# Patient Record
Sex: Female | Born: 1975 | Race: Black or African American | Hispanic: No | Marital: Single | Smoking: Former smoker
Health system: Southern US, Community
[De-identification: ages and names within clinical notes are randomized; demographics above are authoritative.]

## PROBLEM LIST (undated history)

## (undated) DIAGNOSIS — Z9889 Other specified postprocedural states: Secondary | ICD-10-CM

## (undated) DIAGNOSIS — IMO0002 Reserved for concepts with insufficient information to code with codable children: Secondary | ICD-10-CM

## (undated) DIAGNOSIS — G8929 Other chronic pain: Secondary | ICD-10-CM

## (undated) DIAGNOSIS — A630 Anogenital (venereal) warts: Secondary | ICD-10-CM

## (undated) DIAGNOSIS — F32A Depression, unspecified: Secondary | ICD-10-CM

## (undated) DIAGNOSIS — R112 Nausea with vomiting, unspecified: Secondary | ICD-10-CM

## (undated) DIAGNOSIS — F329 Major depressive disorder, single episode, unspecified: Secondary | ICD-10-CM

## (undated) DIAGNOSIS — R896 Abnormal cytological findings in specimens from other organs, systems and tissues: Secondary | ICD-10-CM

## (undated) DIAGNOSIS — IMO0001 Reserved for inherently not codable concepts without codable children: Secondary | ICD-10-CM

## (undated) DIAGNOSIS — E119 Type 2 diabetes mellitus without complications: Secondary | ICD-10-CM

## (undated) DIAGNOSIS — I10 Essential (primary) hypertension: Secondary | ICD-10-CM

## (undated) HISTORY — PX: TONSILLECTOMY AND ADENOIDECTOMY: SUR1326

## (undated) HISTORY — DX: Depression, unspecified: F32.A

## (undated) HISTORY — DX: Major depressive disorder, single episode, unspecified: F32.9

## (undated) HISTORY — PX: CERVICAL BIOPSY  W/ LOOP ELECTRODE EXCISION: SUR135

## (undated) HISTORY — DX: Reserved for concepts with insufficient information to code with codable children: IMO0002

## (undated) HISTORY — DX: Other chronic pain: G89.29

## (undated) HISTORY — DX: Reserved for inherently not codable concepts without codable children: IMO0001

## (undated) HISTORY — PX: APPENDECTOMY: SHX54

## (undated) HISTORY — DX: Essential (primary) hypertension: I10

## (undated) HISTORY — DX: Abnormal cytological findings in specimens from other organs, systems and tissues: R89.6

## (undated) HISTORY — DX: Anogenital (venereal) warts: A63.0

## (undated) HISTORY — PX: ABDOMINAL SURGERY: SHX537

## (undated) HISTORY — DX: Type 2 diabetes mellitus without complications: E11.9

---

## 1999-04-03 ENCOUNTER — Other Ambulatory Visit: Admission: RE | Admit: 1999-04-03 | Discharge: 1999-04-03 | Payer: Self-pay | Admitting: Gynecology

## 1999-04-04 ENCOUNTER — Encounter (INDEPENDENT_AMBULATORY_CARE_PROVIDER_SITE_OTHER): Payer: Self-pay | Admitting: Specialist

## 1999-04-04 ENCOUNTER — Other Ambulatory Visit: Admission: RE | Admit: 1999-04-04 | Discharge: 1999-04-04 | Payer: Self-pay | Admitting: Gynecology

## 1999-07-18 ENCOUNTER — Emergency Department (HOSPITAL_COMMUNITY): Admission: EM | Admit: 1999-07-18 | Discharge: 1999-07-18 | Payer: Self-pay | Admitting: Emergency Medicine

## 1999-09-26 ENCOUNTER — Emergency Department (HOSPITAL_COMMUNITY): Admission: EM | Admit: 1999-09-26 | Discharge: 1999-09-26 | Payer: Self-pay | Admitting: *Deleted

## 1999-11-02 ENCOUNTER — Ambulatory Visit (HOSPITAL_COMMUNITY): Admission: RE | Admit: 1999-11-02 | Discharge: 1999-11-02 | Payer: Self-pay | Admitting: Gynecology

## 2005-01-04 ENCOUNTER — Ambulatory Visit (HOSPITAL_COMMUNITY): Admission: RE | Admit: 2005-01-04 | Discharge: 2005-01-04 | Payer: Self-pay | Admitting: Chiropractic Medicine

## 2005-12-12 ENCOUNTER — Emergency Department (HOSPITAL_COMMUNITY): Admission: EM | Admit: 2005-12-12 | Discharge: 2005-12-13 | Payer: Self-pay | Admitting: Emergency Medicine

## 2006-01-11 ENCOUNTER — Emergency Department (HOSPITAL_COMMUNITY): Admission: EM | Admit: 2006-01-11 | Discharge: 2006-01-12 | Payer: Self-pay | Admitting: Emergency Medicine

## 2006-12-30 ENCOUNTER — Other Ambulatory Visit: Admission: RE | Admit: 2006-12-30 | Discharge: 2006-12-30 | Payer: Self-pay | Admitting: Gynecology

## 2007-07-13 ENCOUNTER — Emergency Department (HOSPITAL_COMMUNITY): Admission: EM | Admit: 2007-07-13 | Discharge: 2007-07-13 | Payer: Self-pay | Admitting: Emergency Medicine

## 2007-08-06 ENCOUNTER — Other Ambulatory Visit: Admission: RE | Admit: 2007-08-06 | Discharge: 2007-08-06 | Payer: Self-pay | Admitting: Gynecology

## 2007-09-09 ENCOUNTER — Emergency Department (HOSPITAL_COMMUNITY): Admission: EM | Admit: 2007-09-09 | Discharge: 2007-09-10 | Payer: Self-pay | Admitting: Emergency Medicine

## 2008-02-15 ENCOUNTER — Other Ambulatory Visit: Admission: RE | Admit: 2008-02-15 | Discharge: 2008-02-15 | Payer: Self-pay | Admitting: Gynecology

## 2008-09-14 ENCOUNTER — Ambulatory Visit: Payer: Self-pay | Admitting: Gynecology

## 2008-09-14 ENCOUNTER — Encounter: Payer: Self-pay | Admitting: Gynecology

## 2008-09-14 ENCOUNTER — Other Ambulatory Visit: Admission: RE | Admit: 2008-09-14 | Discharge: 2008-09-14 | Payer: Self-pay | Admitting: Gynecology

## 2008-11-23 ENCOUNTER — Ambulatory Visit: Payer: Self-pay | Admitting: Women's Health

## 2009-02-23 ENCOUNTER — Ambulatory Visit: Payer: Self-pay | Admitting: Gynecology

## 2009-02-23 ENCOUNTER — Encounter: Payer: Self-pay | Admitting: Gynecology

## 2009-02-23 ENCOUNTER — Other Ambulatory Visit: Admission: RE | Admit: 2009-02-23 | Discharge: 2009-02-23 | Payer: Self-pay | Admitting: Gynecology

## 2009-02-23 DIAGNOSIS — IMO0002 Reserved for concepts with insufficient information to code with codable children: Secondary | ICD-10-CM

## 2009-02-23 HISTORY — DX: Reserved for concepts with insufficient information to code with codable children: IMO0002

## 2009-03-08 ENCOUNTER — Ambulatory Visit: Payer: Self-pay | Admitting: Gynecology

## 2009-03-14 ENCOUNTER — Ambulatory Visit: Payer: Self-pay | Admitting: Gynecology

## 2009-04-12 ENCOUNTER — Encounter: Payer: Self-pay | Admitting: Gynecology

## 2009-04-12 ENCOUNTER — Ambulatory Visit: Payer: Self-pay | Admitting: Gynecology

## 2009-09-14 ENCOUNTER — Other Ambulatory Visit: Admission: RE | Admit: 2009-09-14 | Discharge: 2009-09-14 | Payer: Self-pay | Admitting: Gynecology

## 2009-09-14 ENCOUNTER — Ambulatory Visit: Payer: Self-pay | Admitting: Gynecology

## 2010-01-05 ENCOUNTER — Ambulatory Visit: Payer: Self-pay | Admitting: Gynecology

## 2010-03-01 ENCOUNTER — Ambulatory Visit: Payer: Self-pay | Admitting: Gynecology

## 2010-03-01 ENCOUNTER — Other Ambulatory Visit: Admission: RE | Admit: 2010-03-01 | Discharge: 2010-03-01 | Payer: Self-pay | Admitting: Gynecology

## 2010-06-15 ENCOUNTER — Ambulatory Visit: Payer: Self-pay | Admitting: Gynecology

## 2010-07-06 ENCOUNTER — Ambulatory Visit: Payer: Self-pay | Admitting: Women's Health

## 2010-07-24 ENCOUNTER — Ambulatory Visit: Payer: Self-pay | Admitting: Gynecology

## 2010-09-03 ENCOUNTER — Ambulatory Visit: Admit: 2010-09-03 | Payer: Self-pay | Admitting: Gynecology

## 2010-09-18 ENCOUNTER — Other Ambulatory Visit
Admission: RE | Admit: 2010-09-18 | Discharge: 2010-09-18 | Payer: Self-pay | Source: Home / Self Care | Admitting: Gynecology

## 2010-09-18 ENCOUNTER — Ambulatory Visit
Admission: RE | Admit: 2010-09-18 | Discharge: 2010-09-18 | Payer: Self-pay | Source: Home / Self Care | Attending: Gynecology | Admitting: Gynecology

## 2010-09-18 ENCOUNTER — Other Ambulatory Visit: Payer: Self-pay | Admitting: Gynecology

## 2011-03-09 ENCOUNTER — Ambulatory Visit
Admission: RE | Admit: 2011-03-09 | Discharge: 2011-03-09 | Disposition: A | Discharge status: Home / Self Care | Payer: PRIVATE HEALTH INSURANCE | Source: Ambulatory Visit | Attending: Family Medicine | Admitting: Family Medicine

## 2011-03-09 ENCOUNTER — Other Ambulatory Visit: Payer: Self-pay | Admitting: Family Medicine

## 2011-03-09 ENCOUNTER — Inpatient Hospital Stay (INDEPENDENT_AMBULATORY_CARE_PROVIDER_SITE_OTHER)
Admission: RE | Admit: 2011-03-09 | Discharge: 2011-03-09 | Disposition: A | Discharge status: Home / Self Care | Payer: PRIVATE HEALTH INSURANCE | Source: Ambulatory Visit | Attending: Family Medicine | Admitting: Family Medicine

## 2011-03-09 ENCOUNTER — Encounter: Payer: Self-pay | Admitting: Family Medicine

## 2011-03-09 DIAGNOSIS — M79609 Pain in unspecified limb: Secondary | ICD-10-CM

## 2011-03-09 DIAGNOSIS — M65979 Unspecified synovitis and tenosynovitis, unspecified ankle and foot: Secondary | ICD-10-CM

## 2011-03-09 DIAGNOSIS — M659 Synovitis and tenosynovitis, unspecified: Secondary | ICD-10-CM

## 2011-03-09 DIAGNOSIS — J45909 Unspecified asthma, uncomplicated: Secondary | ICD-10-CM | POA: Insufficient documentation

## 2011-03-18 ENCOUNTER — Ambulatory Visit (INDEPENDENT_AMBULATORY_CARE_PROVIDER_SITE_OTHER): Payer: PRIVATE HEALTH INSURANCE

## 2011-03-18 ENCOUNTER — Inpatient Hospital Stay (INDEPENDENT_AMBULATORY_CARE_PROVIDER_SITE_OTHER)
Admission: RE | Admit: 2011-03-18 | Discharge: 2011-03-18 | Disposition: A | Discharge status: Home / Self Care | Payer: PRIVATE HEALTH INSURANCE | Source: Ambulatory Visit | Attending: Emergency Medicine | Admitting: Emergency Medicine

## 2011-03-18 DIAGNOSIS — R071 Chest pain on breathing: Secondary | ICD-10-CM

## 2011-03-18 LAB — POCT PREGNANCY, URINE: Preg Test, Ur: NEGATIVE

## 2011-03-18 LAB — POCT URINALYSIS DIP (DEVICE)
Bilirubin Urine: NEGATIVE
Glucose, UA: NEGATIVE mg/dL
Nitrite: POSITIVE — AB

## 2011-03-20 LAB — URINE CULTURE
Colony Count: >=100000
Culture  Setup Time: 201207240137

## 2011-03-21 ENCOUNTER — Other Ambulatory Visit: Payer: Self-pay | Admitting: Gynecology

## 2011-03-28 ENCOUNTER — Telehealth: Payer: Self-pay | Admitting: *Deleted

## 2011-03-28 NOTE — Telephone Encounter (Signed)
PT CALLED WANTING REFILL FOR DIFLUCAN. PT HAS NOT BEEN SEEN SINCE 09/18/10. LM ON PT VM STATING SHE WOULD NEED TO MAKE AN APPOINTMENT HAVING YEAST S/S.

## 2011-04-08 ENCOUNTER — Other Ambulatory Visit: Payer: Self-pay | Admitting: *Deleted

## 2011-04-08 DIAGNOSIS — B373 Candidiasis of vulva and vagina: Secondary | ICD-10-CM

## 2011-04-08 MED ORDER — FLUCONAZOLE 200 MG PO TABS: 200.0000 mg | ORAL_TABLET | Freq: Every day | ORAL | Status: AC

## 2011-07-02 DIAGNOSIS — IMO0002 Reserved for concepts with insufficient information to code with codable children: Secondary | ICD-10-CM | POA: Insufficient documentation

## 2011-07-02 DIAGNOSIS — A630 Anogenital (venereal) warts: Secondary | ICD-10-CM | POA: Insufficient documentation

## 2011-07-04 ENCOUNTER — Encounter: Payer: PRIVATE HEALTH INSURANCE | Admitting: Gynecology

## 2011-07-22 ENCOUNTER — Encounter: Payer: Self-pay | Admitting: *Deleted

## 2011-07-23 ENCOUNTER — Encounter: Payer: Self-pay | Admitting: Gynecology

## 2011-07-23 ENCOUNTER — Telehealth: Payer: Self-pay | Admitting: *Deleted

## 2011-07-23 ENCOUNTER — Ambulatory Visit (INDEPENDENT_AMBULATORY_CARE_PROVIDER_SITE_OTHER): Payer: PRIVATE HEALTH INSURANCE | Admitting: Gynecology

## 2011-07-23 ENCOUNTER — Other Ambulatory Visit (HOSPITAL_COMMUNITY)
Admission: RE | Admit: 2011-07-23 | Discharge: 2011-07-23 | Disposition: A | Discharge status: Home / Self Care | Payer: PRIVATE HEALTH INSURANCE | Source: Ambulatory Visit | Attending: Gynecology | Admitting: Gynecology

## 2011-07-23 VITALS — BP 100/70 | Ht 63.0 in | Wt 139.0 lb

## 2011-07-23 DIAGNOSIS — Z01419 Encounter for gynecological examination (general) (routine) without abnormal findings: Secondary | ICD-10-CM | POA: Insufficient documentation

## 2011-07-23 DIAGNOSIS — B373 Candidiasis of vulva and vagina: Secondary | ICD-10-CM

## 2011-07-23 DIAGNOSIS — B3731 Acute candidiasis of vulva and vagina: Secondary | ICD-10-CM

## 2011-07-23 DIAGNOSIS — N898 Other specified noninflammatory disorders of vagina: Secondary | ICD-10-CM

## 2011-07-23 DIAGNOSIS — Z309 Encounter for contraceptive management, unspecified: Secondary | ICD-10-CM

## 2011-07-23 DIAGNOSIS — Z131 Encounter for screening for diabetes mellitus: Secondary | ICD-10-CM

## 2011-07-23 DIAGNOSIS — Z13220 Encounter for screening for lipoid disorders: Secondary | ICD-10-CM

## 2011-07-23 DIAGNOSIS — D72829 Elevated white blood cell count, unspecified: Secondary | ICD-10-CM

## 2011-07-23 MED ORDER — FLUCONAZOLE 150 MG PO TABS: 150.0000 mg | ORAL_TABLET | Freq: Once | ORAL | Status: AC

## 2011-07-23 NOTE — Telephone Encounter (Signed)
Patient informed Mirena IUD covered with a $30 copay.  She said she still wants to think about it before setting up insert in December.

## 2011-07-23 NOTE — Telephone Encounter (Signed)
Message copied by Libby Maw on Tue Jul 23, 2011  2:29 PM ------      Message from: Carole Civil R      Created: Tue Jul 23, 2011 12:37 PM      Regarding: [facility] [employer]EFITS MIRENA      Contact: [phone]       Hey Marzell Allemand-JF pt above stopped by and said she wanted cost for Mirena IUD. She can be reached at above number. A current copy of her [employer] Card is in the computer. Thanks.Toniann Fail

## 2011-07-23 NOTE — Progress Notes (Signed)
Lori Cameron 1976-04-13 [id number]        35 y.o.  for annual exam.  Several issues as noted below.  Past medical history,surgical history, medications, allergies, family history and social history were all reviewed and documented in the EPIC chart. ROS:  Was performed and pertinent positives and negatives are included in the history.  Exam: chaperone present Filed Vitals:   07/23/11 1152  BP: 100/70   General appearance  Normal Skin grossly normal Head/Neck normal with no cervical or supraclavicular adenopathy thyroid normal Lungs  clear Cardiac RR, without RMG Abdominal  soft, nontender, without masses, organomegaly or hernia Breasts  examined lying and sitting without masses, retractions, discharge or axillary adenopathy. Pelvic  Ext/BUS/vagina  normal white discharge  Cervix  normal  Pap done  Uterus  anteverted, normal size, shape and contour, midline and mobile nontender   Adnexa  Without masses or tenderness    Anus and perineum  normal   Rectovaginal  normal sphincter tone without palpated masses or tenderness.    Assessment/Plan:  35 y.o. female for annual exam.    1. White discharge. Patient is noticed for the last 2 weeks a vaginal discharge with itching. Her KOH wet prep is positive for yeast today. We'll treat with Diflucan 150x1 dose follow up if symptoms persist or recur. 2. History of dysplasia. 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 She has had 3 Pap smears since then which are negative. Pap smear was done today and we'll plan on annual repeat. 3. Contraception. Patient had been on oral contraceptives but stopped it due to recurrent vaginitis and she notes that the symptoms seem to have resolved. We reviewed all options of contraception to include pill patch ring, Depo-Provera Implanon, IUDs, sterilization. I gave her literature on the Mirena IUD as she's interested in this. She will read over this and follow up if she decides to pursue  the IUD. If she decides on alternatives and she will call or represent to discuss. 4. Health maintenance. SBE monthly reviewed. Screening mammogram recommendations between 35 and 40 were discussed. Baseline CBC urinalysis lipid profile glucose ordered. Patient will follow up with her contraceptive decision otherwise annually.  Dara Lords MD, 12:39 PM 07/23/2011

## 2011-07-23 NOTE — Progress Notes (Signed)
Addended by: [place], Nohea Kras E on: 07/23/2011 12:51 PM   Modules accepted: Orders

## 2011-07-23 NOTE — Telephone Encounter (Signed)
Pt called today after being seen this am. She wanted a extra refill on diflucan, lm on pt vm that she should try the one dose first then if symptoms still there call the office back.

## 2011-07-24 NOTE — Progress Notes (Signed)
Addended by: Aura Camps on: 07/24/2011 12:14 PM   Modules accepted: Orders

## 2011-07-29 ENCOUNTER — Other Ambulatory Visit: Payer: Self-pay

## 2011-07-29 MED ORDER — TERCONAZOLE 0.8 % VA CREA: 1.0000 | TOPICAL_CREAM | Freq: Every day | VAGINAL | Status: AC

## 2011-07-29 NOTE — Telephone Encounter (Signed)
Pt. Notified by cell voicemail of Dr. Kristie Cowman note below and [facility]

## 2011-07-29 NOTE — Telephone Encounter (Signed)
YOU JUST SAW PT LAST WEEK AND GAVE HER DIFLUCAN. HER SYMPTOMS ARE NOT CLEARED UP COMPLETELY AND IS REQUESTING A REFILL ON THE DIFLUCAN.

## 2011-07-29 NOTE — Progress Notes (Signed)
Summary: foot xray/TM   Vital Signs:  Patient Profile:   35 Years Old Female CC:      left foot pain Height:     62 inches Weight:      141 pounds O2 Sat:      99 % O2 treatment:    Room Air Temp:     97.9 degrees F oral Resp:     20 per minute BP sitting:   114 / 76  (left arm) Cuff size:   regular  Vitals Entered By: Linton Flemings RN (March 09, 2011 9:39 AM)                  Updated Prior Medication List: PROAIR HFA 108 (90 BASE) MCG/ACT AERS (ALBUTEROL SULFATE)   Current Allergies: No known allergies History of Present Illness Chief Complaint: left foot pain History of Present Illness:  Subjective:  Patient complains of 2 to 3 week history of pain in the dorsum of her left foot, most noticeable in the morning when she first starts bearing weight.  The pain decreases after she starts walking.  No history of trauma.  She has started working out regularly in a gym since May (treadmill, elliptical, weights, etc).  REVIEW OF SYSTEMS Constitutional Symptoms      Denies fever, chills, night sweats, weight loss, weight gain, and fatigue.  Eyes       Denies change in vision, eye pain, eye discharge, glasses, contact lenses, and eye surgery. Ear/Nose/Throat/Mouth       Denies hearing loss/aids, change in hearing, ear pain, ear discharge, dizziness, frequent runny nose, frequent nose bleeds, sinus problems, sore throat, hoarseness, and tooth pain or bleeding.  Respiratory       Denies dry cough, productive cough, wheezing, shortness of breath, asthma, bronchitis, and emphysema/COPD.  Cardiovascular       Denies murmurs, chest pain, and tires easily with exhertion.    Gastrointestinal       Denies stomach pain, nausea/vomiting, diarrhea, constipation, blood in bowel movements, and indigestion. Genitourniary       Denies painful urination, kidney stones, and loss of urinary control. Neurological       Denies paralysis, seizures, and fainting/blackouts. Musculoskeletal    Denies muscle pain, joint pain, joint stiffness, decreased range of motion, redness, swelling, muscle weakness, and gout.  Skin       Denies bruising, unusual mles/lumps or sores, and hair/skin or nail changes.  Psych       Denies mood changes, temper/anger issues, anxiety/stress, speech problems, depression, and sleep problems. Other Comments: left foot pain x3 weeks   Past History:  Past Medical History: Asthma  Past Surgical History: Tonsillectomy  Family History: DM, HTN, CANCER, ATRTHRITIS  Social History: SMOKE-NO ALCOHOL- YES DRUGS-NO   Objective:  No acute distress; walks without difficulty Extremities:  No leg length discrepancy by inspection Left ankle:  Full range of motion  Left foot:  No swelling, erythema, warmth, or deformity.  There is point tenderness over the mid-dorsum, accentuated by resisted dorsiflexion of the foot.  Distal neurovascular intact.  Full range of motion all toes.  Pain is also elicited by squeezing the forefoot. X-ray left foot:  Negative Assessment New Problems: TENOSYNOVITIS OF FOOT AND ANKLE (ICD-727.06) ASTHMA (ICD-493.90) FOOT PAIN, LEFT (ICD-729.5)  SUSPECT EXTENSOR TENDONITIS LEFT FOOT  Plan New Orders: T-DG Foot Complete*L* [73630] No Charge Patient Arrived (NCPA0) [NCPA0] Planning Comments:   Begin applying ice pack several times daily.  Ibuprofen 200mg , 4 tabs every 8  hours with food  Begin stretching exercises.  Ensure proper shoe wear and arch supports. Followup with [facility] if not improved in two weeks.    Diagnoses and expected course of recovery discussed and will return if not improved as expected or if the condition worsens. Patient and/or caregiver verbalized understanding.   Orders Added: 1)  T-DG Foot Complete*L* [73630] 2)  No Charge Patient Arrived (NCPA0) [NCPA0]

## 2011-07-29 NOTE — Telephone Encounter (Signed)
Terazol 3 day cream 

## 2011-08-23 ENCOUNTER — Ambulatory Visit (INDEPENDENT_AMBULATORY_CARE_PROVIDER_SITE_OTHER): Payer: PRIVATE HEALTH INSURANCE | Admitting: Gynecology

## 2011-08-23 ENCOUNTER — Telehealth: Payer: Self-pay | Admitting: *Deleted

## 2011-08-23 ENCOUNTER — Encounter: Payer: Self-pay | Admitting: Gynecology

## 2011-08-23 VITALS — BP 140/88

## 2011-08-23 DIAGNOSIS — Z30431 Encounter for routine checking of intrauterine contraceptive device: Secondary | ICD-10-CM

## 2011-08-23 DIAGNOSIS — N882 Stricture and stenosis of cervix uteri: Secondary | ICD-10-CM

## 2011-08-23 DIAGNOSIS — Z30430 Encounter for insertion of intrauterine contraceptive device: Secondary | ICD-10-CM

## 2011-08-23 MED ORDER — LEVONORGESTREL 20 MCG/24HR IU IUD: INTRAUTERINE_SYSTEM | Freq: Once | INTRAUTERINE | Status: DC

## 2011-08-23 NOTE — Progress Notes (Signed)
Patient presents for Mirena IUD on a normal menses. She is thought about all of her options read through the booklet and wants to proceed with the Mirena. She has signed the consent form. I reviewed the insertional process with her in the acute risks of infection, uterine perforation requiring surgery to remove. Long-term risks of infection and failure risk with pregnancy was reviewed. Patient understands accepts she has no questions.  She has no history of vaginal births and does have a history of LEEP and I recommend that we use a paracervical block to make it more comfortable for her and she agrees with this.  Exam with Elane Fritz chaperone present Pelvic: External BUS vagina normal with slight menses flow. Cervix normal with menses flow. Uterus anteverted midline mobile nontender. Adnexa without masses or tenderness.  IUD insertion Cervix was infiltrated anteriorly with 1% lidocaine. Single tooth tenaculum anterior lip stabilization. Paracervical block using 1% lidocaine total of 8 cc placed. Uterus was sounded and subsequently a Mirena IUD was placed according to manufacturer's recommendations. The string was trimmed and the patient tolerated well. She will follow up in one month for postinsertional check.

## 2011-08-23 NOTE — Patient Instructions (Signed)
Follow up in one month for post IUD insertion checkup

## 2011-08-23 NOTE — Telephone Encounter (Signed)
You can use Cytotec the night before but would not help her for today's insertion. I have never used it for this indication but usually will do a paracervical block if it looks like the cervix needs to be dilated which helps.

## 2011-08-23 NOTE — Telephone Encounter (Signed)
Pt is having Mirena IUD placement done today at 4:30pm, and she spoke with her coworker about getting this done. Pt said that her coworker told her that there is a medication she can take that can soften her cervix? Pt is requesting this medication if this is true. Please advise

## 2011-08-23 NOTE — Telephone Encounter (Signed)
Lm the below message on pt vm. Pt will call if any questions.

## 2011-08-26 ENCOUNTER — Other Ambulatory Visit: Payer: Self-pay | Admitting: Gynecology

## 2011-08-26 DIAGNOSIS — Z30430 Encounter for insertion of intrauterine contraceptive device: Secondary | ICD-10-CM

## 2011-09-20 ENCOUNTER — Ambulatory Visit: Payer: PRIVATE HEALTH INSURANCE | Admitting: Gynecology

## 2011-09-23 ENCOUNTER — Ambulatory Visit: Payer: PRIVATE HEALTH INSURANCE | Admitting: Gynecology

## 2011-09-24 ENCOUNTER — Ambulatory Visit: Payer: PRIVATE HEALTH INSURANCE | Admitting: Gynecology

## 2011-10-10 ENCOUNTER — Ambulatory Visit: Payer: PRIVATE HEALTH INSURANCE | Admitting: Gynecology

## 2011-10-11 ENCOUNTER — Ambulatory Visit (INDEPENDENT_AMBULATORY_CARE_PROVIDER_SITE_OTHER): Payer: PRIVATE HEALTH INSURANCE | Admitting: Gynecology

## 2011-10-11 ENCOUNTER — Encounter: Payer: Self-pay | Admitting: Gynecology

## 2011-10-11 ENCOUNTER — Telehealth: Payer: Self-pay | Admitting: *Deleted

## 2011-10-11 DIAGNOSIS — Z30431 Encounter for routine checking of intrauterine contraceptive device: Secondary | ICD-10-CM

## 2011-10-11 NOTE — Progress Notes (Signed)
Patient presents in follow up from her IUD placement end of December. She's done well without complaints and has had intercourse without issues.  Exam with Sherrilyn Rist chaperone present Pelvic external BUS vagina normal. Cervix normal with IUD string visualized. Uterus normal size midline mobile nontender. Adnexa without masses or tenderness.  Assessment and plan: Normal IUD checkup. Patient will keep menstrual calendar. Assuming she does well then she will see [place] when she is due for her annual, sooner as needed.

## 2011-10-11 NOTE — Telephone Encounter (Signed)
Message copied by Aura Camps on Fri Oct 11, 2011 12:37 PM ------      Message from: Dara Lords      Created: Fri Oct 11, 2011 12:05 PM       Tell patient that after she left from her visit I realized that she never repeated her CBC that we asked her to do from November. Ask if she can come in on its least inconvenient and have her CBC drawn. The order was placed for future.

## 2011-10-11 NOTE — Telephone Encounter (Signed)
Called pt and left message on pt voice mail to call.

## 2011-10-11 NOTE — Patient Instructions (Signed)
Follow up November 2013 for your annual exam. Sooner if there are any issues.

## 2011-10-21 NOTE — Telephone Encounter (Signed)
Pt has appointment with her PCP and will have CBC results faxed over to office.

## 2011-10-31 ENCOUNTER — Telehealth: Payer: Self-pay | Admitting: *Deleted

## 2011-10-31 MED ORDER — MEGESTROL ACETATE 20 MG PO TABS: 20.0000 mg | ORAL_TABLET | Freq: Every day | ORAL | Status: AC

## 2011-10-31 NOTE — Telephone Encounter (Signed)
Left message on vm with the below, rx sent to pharmacy.

## 2011-10-31 NOTE — Telephone Encounter (Signed)
Pt called c/o vaginal bleeding since feb 21. She has  IUD, placement done at the end of December. Pt said that feb 21 is a normal time her period would start, not gushing blood, light to heavy at times, color of blood brownish. No pain nor cramping or odor. She had some bleeding that lasted about 11 day that she spoke with you about at her follow up visit on 10/11/11. Please advise

## 2011-10-31 NOTE — Telephone Encounter (Signed)
Let's try Megace 20 mg daily x10 days.

## 2011-11-06 ENCOUNTER — Telehealth: Payer: Self-pay | Admitting: *Deleted

## 2011-11-06 NOTE — Telephone Encounter (Signed)
Patient will send recent labs done for TF to review.  Still having a little spotting but not as bad.  If continues will call back.

## 2011-11-06 NOTE — Telephone Encounter (Signed)
Lm for patient.  She said she had information about labs.

## 2012-03-07 ENCOUNTER — Emergency Department
Admission: EM | Admit: 2012-03-07 | Discharge: 2012-03-07 | Disposition: A | Discharge status: Home / Self Care | Payer: PRIVATE HEALTH INSURANCE | Source: Home / Self Care

## 2012-03-07 DIAGNOSIS — R300 Dysuria: Secondary | ICD-10-CM

## 2012-03-07 DIAGNOSIS — N390 Urinary tract infection, site not specified: Secondary | ICD-10-CM

## 2012-03-07 LAB — POCT URINALYSIS DIP (MANUAL ENTRY)
Blood, UA: NEGATIVE
Nitrite, UA: POSITIVE
Spec Grav, UA: 1.02 (ref 1.005–1.03)
Urobilinogen, UA: 0.2 (ref 0–1)
pH, UA: 7 (ref 5–8)

## 2012-03-07 MED ORDER — CEPHALEXIN 500 MG PO CAPS: 500.0000 mg | ORAL_CAPSULE | Freq: Three times a day (TID) | ORAL | Status: AC

## 2012-03-07 NOTE — ED Notes (Signed)
Painful urination, frequency x1 week

## 2012-03-08 NOTE — ED Provider Notes (Signed)
History     CSN: [id number]  Arrival date & time 03/07/12  1135   None     Chief Complaint  Patient presents with  . Urinary Tract Infection  HPI Comments: DYSURIA Onset:  1 week  Description: dysuria, increased urinary frequency, increased hesitancy  Modifying factors: none  Symptoms Urgency:  yes Frequency: yes  Hesitancy:  yes Hematuria:  no Flank Pain:  no Fever: no Nausea/Vomiting:  mild Missed LMP: no STD exposure: no Discharge: no Irritants: no Rash: no  Red Flags   More than 3 UTI's last 12 months:  2 PMH of  Diabetes or Immunosuppression:  no Renal Disease/Calculi: no Urinary Tract Abnormality:  no Instrumentation or Trauma: no     Patient is a 36 y.o. female presenting with urinary tract infection.  Urinary Tract Infection This is a new problem. Episode onset: 1 week. The problem occurs constantly. The problem has not changed since onset.Nothing aggravates the symptoms. Nothing relieves the symptoms.    Past Medical History  Diagnosis Date  . Condyloma acuminatum     history of  . 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 Asthma   . IUD 07/2011    MIRENA    Past Surgical History  Procedure Date  . Cervical biopsy  w/ loop electrode excision     CINI MARGINS FREE  . Tonsillectomy and adenoidectomy   . Abdominal surgery     LAPAROTOMY APPENDECTOMY, LYSIS OF ADHESIONS  . Intrauterine device insertion 07/2011    MIRENA    Family History  Problem Relation Age of Onset  . Diabetes Mother   . Hypertension Mother   . Cancer Mother     OVARIAN OR UTERINE  . Hypertension Maternal Grandmother   . Heart disease Maternal Grandmother   . Cancer Maternal Aunt     colon    History  Substance Use Topics  . Smoking status: Passive Smoker  . Smokeless tobacco: Never Used  . Alcohol Use: Yes    OB History    Grav Para Term Preterm Abortions TAB SAB Ect Mult Living     1 0   1     0      Review of Systems  All other systems reviewed and are negative.    Allergies  Amoxicillin-pot clavulanate and Clindamycin hcl  Home Medications   Current Outpatient Rx  Name Route Sig Dispense Refill  . ALBUTEROL IN Inhalation Inhale into the lungs. Prn     . CEPHALEXIN 500 MG PO CAPS Oral Take 1 capsule (500 mg total) by mouth 3 (three) times daily. 21 capsule 0  . VITAMIN B-12 PO Oral Take by mouth.     . MULTIVITAMIN PO Oral Take by mouth.      Marland Kitchen LOVAZA PO Oral Take by mouth.      Marland Kitchen OVER THE COUNTER MEDICATION  Vitamin B-6  supp       BP 132/76  Pulse 88  Temp 97.8 F (36.6 C) (Oral)  Resp 20  Ht 5\' 2"  (1.575 m)  Wt 147 lb (66.679 kg)  BMI 26.89 kg/m2  SpO2 98%  LMP 02/23/2012  Physical Exam  Constitutional: She appears well-developed and well-nourished.  HENT:  Head: Normocephalic and atraumatic.  Eyes: Conjunctivae are normal. Pupils are equal, round, and reactive to light.  Neck: Normal range of motion. Neck supple.  Cardiovascular: Normal rate and regular rhythm.  Pulmonary/Chest: Effort normal and breath sounds normal.  Abdominal: Soft. Bowel sounds are normal.       Mild suprapubic tenderness    Musculoskeletal: Normal range of motion.  Neurological: She is alert.  Skin: Skin is warm.    ED Course  Procedures (including critical care time)   Labs Reviewed  POCT URINALYSIS DIP (MANUAL ENTRY)  URINE CULTURE   No results found.   No diagnosis found.    MDM  Clinically with UTI.  Will treat with keflex.  Urine culture.  Discussed infectious red flags.  If sxs recur within next 3-6 months, would consider urology referral.           Floydene Flock, MD 03/08/12 1901

## 2012-03-09 NOTE — ED Provider Notes (Signed)
Agree with exam, assessment, and plan.   Lattie Haw, MD 03/09/12 1026

## 2012-03-10 ENCOUNTER — Telehealth: Payer: Self-pay | Admitting: *Deleted

## 2012-03-10 LAB — URINE CULTURE

## 2012-03-10 NOTE — ED Notes (Unsigned)
Spoke to pt given urine culture results. Advised her to complete ABT and call back if she fails to improve.

## 2012-03-10 NOTE — ED Notes (Unsigned)
Pt c/o vaginal itching and white d/c while taking ABT. Per dr Cathren Harsh called in diflucan 150mg  1 po now, repeat in 3 days prn # 2/0 to [facility] [address] [place]. Pt notified.

## 2012-06-13 ENCOUNTER — Emergency Department
Admission: EM | Admit: 2012-06-13 | Discharge: 2012-06-13 | Disposition: A | Discharge status: Home / Self Care | Payer: PRIVATE HEALTH INSURANCE | Source: Home / Self Care

## 2012-06-13 ENCOUNTER — Encounter: Payer: Self-pay | Admitting: *Deleted

## 2012-06-13 DIAGNOSIS — R05 Cough: Secondary | ICD-10-CM

## 2012-06-13 DIAGNOSIS — J45901 Unspecified asthma with (acute) exacerbation: Secondary | ICD-10-CM

## 2012-06-13 MED ORDER — PREDNISONE 50 MG PO TABS: ORAL_TABLET | ORAL | Status: DC

## 2012-06-13 MED ORDER — IPRATROPIUM-ALBUTEROL 0.5-2.5 (3) MG/3ML IN SOLN
3.0000 mL | RESPIRATORY_TRACT | Status: DC
  Administered 2012-06-13 (×2): 3 mL via RESPIRATORY_TRACT

## 2012-06-13 MED ORDER — METHYLPREDNISOLONE SODIUM SUCC 125 MG IJ SOLR
125.0000 mg | Freq: Once | INTRAMUSCULAR | Status: AC
  Administered 2012-06-13: 125 mg via INTRAMUSCULAR

## 2012-06-13 NOTE — ED Notes (Signed)
Patient c/o chest tightness started 2-3 days ago.

## 2012-06-13 NOTE — ED Provider Notes (Signed)
History     CSN: [id number]  Arrival date & time 06/13/12  1522   None     Chief Complaint  Patient presents with  . Wheezing   HPI Cough x 3-4 days  This has been persistent.  No URI sxs.  Has had increase in baseline wheezing in setting of hx/o asthma.  No fevers or chills.  Minimal SOB.   Past Medical History  Diagnosis Date  . Condyloma acuminatum     history of  . 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 Asthma   . IUD 07/2011    MIRENA    Past Surgical History  Procedure Date  . Cervical biopsy  w/ loop electrode excision     CINI MARGINS FREE  . Tonsillectomy and adenoidectomy   . Abdominal surgery     LAPAROTOMY APPENDECTOMY, LYSIS OF ADHESIONS  . Intrauterine device insertion 07/2011    MIRENA    Family History  Problem Relation Age of Onset  . Diabetes Mother   . Hypertension Mother   . Cancer Mother     OVARIAN OR UTERINE  . Hypertension Maternal Grandmother   . Heart disease Maternal Grandmother   . Cancer Maternal Aunt     colon    History  Substance Use Topics  . Smoking status: Passive Smoke Exposure - Never Smoker  . Smokeless tobacco: Never Used  . Alcohol Use: Yes    OB History    Grav Para Term Preterm Abortions TAB SAB Ect Mult Living   1 0   1     0      Review of Systems  All other systems reviewed and are negative.    Allergies  Amoxicillin-pot clavulanate and Clindamycin hcl  Home Medications   Current Outpatient Rx  Name Route Sig Dispense Refill  . ALBUTEROL IN Inhalation Inhale into the lungs. Prn     . VITAMIN B-12 PO Oral Take by mouth.     . MULTIVITAMIN PO Oral Take by mouth.      Marland Kitchen LOVAZA PO Oral Take by mouth.      Marland Kitchen OVER THE COUNTER MEDICATION  Vitamin B-6  supp       BP 115/72  Pulse 79  Temp 98.2 F (36.8 C) (Oral)  Resp 24  Ht 5\' 2"  (1.575 m)  Wt 153 lb (69.4 kg)  BMI 27.98 kg/m2  SpO2 97%  Physical Exam    Constitutional: She appears well-developed and well-nourished.  HENT:  Head: Normocephalic and atraumatic.  Right Ear: External ear normal.  Left Ear: External ear normal.  Mouth/Throat: Oropharynx is clear and moist.  Eyes: Conjunctivae normal are normal. Pupils are equal, round, and reactive to light.  Neck: Normal range of motion. Neck supple.  Cardiovascular: Normal rate, regular rhythm and normal heart sounds.   Pulmonary/Chest: Effort normal and breath sounds normal.       Faint wheezes in bases    Abdominal: Soft. Bowel sounds are normal.  Musculoskeletal: Normal range of motion.  Neurological: She is alert.  Skin: Skin is warm.    ED Course  Procedures (including critical care time)  Labs Reviewed - No data to display No results found.   1. Cough   2. Asthma exacerbation       MDM  Duoneb x1  Solumedrol 125mg  IM x1.  Continue albuterol use.  Discussed infectious and resp red flags.  If  cough persists, will consider trial of oral steroids +/- CXR.      The patient and/or caregiver has been counseled thoroughly with regard to treatment plan and/or medications prescribed including dosage, schedule, interactions, rationale for use, and possible side effects and they verbalize understanding. Diagnoses and expected course of recovery discussed and will return if not improved as expected or if the condition worsens. Patient and/or caregiver verbalized understanding.             Doree Albee, MD 06/13/12 [phone]

## 2012-07-28 ENCOUNTER — Encounter: Payer: PRIVATE HEALTH INSURANCE | Admitting: Gynecology

## 2012-08-28 ENCOUNTER — Other Ambulatory Visit (HOSPITAL_COMMUNITY)
Admission: RE | Admit: 2012-08-28 | Discharge: 2012-08-28 | Disposition: A | Discharge status: Home / Self Care | Payer: PRIVATE HEALTH INSURANCE | Source: Ambulatory Visit | Attending: Gynecology | Admitting: Gynecology

## 2012-08-28 ENCOUNTER — Telehealth: Payer: Self-pay | Admitting: *Deleted

## 2012-08-28 ENCOUNTER — Ambulatory Visit (INDEPENDENT_AMBULATORY_CARE_PROVIDER_SITE_OTHER): Payer: PRIVATE HEALTH INSURANCE | Admitting: Gynecology

## 2012-08-28 ENCOUNTER — Encounter: Payer: Self-pay | Admitting: Gynecology

## 2012-08-28 VITALS — BP 122/76 | Ht 63.0 in | Wt 158.0 lb

## 2012-08-28 DIAGNOSIS — Z01419 Encounter for gynecological examination (general) (routine) without abnormal findings: Secondary | ICD-10-CM

## 2012-08-28 DIAGNOSIS — R635 Abnormal weight gain: Secondary | ICD-10-CM

## 2012-08-28 DIAGNOSIS — F32A Depression, unspecified: Secondary | ICD-10-CM | POA: Insufficient documentation

## 2012-08-28 DIAGNOSIS — Z30431 Encounter for routine checking of intrauterine contraceptive device: Secondary | ICD-10-CM

## 2012-08-28 DIAGNOSIS — Z1151 Encounter for screening for human papillomavirus (HPV): Secondary | ICD-10-CM | POA: Insufficient documentation

## 2012-08-28 DIAGNOSIS — T839XXA Unspecified complication of genitourinary prosthetic device, implant and graft, initial encounter: Secondary | ICD-10-CM

## 2012-08-28 DIAGNOSIS — F329 Major depressive disorder, single episode, unspecified: Secondary | ICD-10-CM | POA: Insufficient documentation

## 2012-08-28 MED ORDER — FLUCONAZOLE 150 MG PO TABS: 150.0000 mg | ORAL_TABLET | Freq: Once | ORAL | Status: DC

## 2012-08-28 NOTE — Progress Notes (Signed)
Lori Cameron [birth year] [id number]        37 y.o.  G1P0010 for annual exam.    Past medical history,surgical history, medications, allergies, family history and social history were all reviewed and documented in the EPIC chart. ROS:  Was performed and pertinent positives and negatives are included in the history.  Exam: Kim assistant Filed Vitals:   08/28/12 1152  BP: 122/76  Height: 5\' 3"  (1.6 m)  Weight: 158 lb (71.668 kg)   General appearance  Normal Skin grossly normal Head/Neck normal with no cervical or supraclavicular adenopathy thyroid symmetrically generous in size Lungs  clear Cardiac RR, without RMG Abdominal  soft, nontender, without masses, organomegaly or hernia Breasts  examined lying and sitting without masses, retractions, discharge or axillary adenopathy. Pelvic  Ext/BUS/vagina  normal   Cervix  normal IUD string visualized Pap/HPV  Uterus  Axial to anteverted, normal size, shape and contour, midline and mobile nontender   Adnexa  Without masses or tenderness    Anus and perineum  normal   Rectovaginal  normal sphincter tone without palpated masses or tenderness.    Assessment/Plan:  37 y.o. G25P0010 female for annual exam.   1. Mirena IUD 07/2011. Menses lasting 14 days of spotting. Check ultrasound for placement/endometrial echo. Check TSH. 2. Symmetrically generous thyroid. Not truly enlarged. Check baseline TSH T4. Family history of goiter. 3. Pap smear 2012.  Pap/HPV today. Does have history of high-grade dysplasia status post LEEP 2010. 4. SBE monthly reviewed. Plan screening mammogram closer to 40, no family history breast cancer. 5. Health maintenance. Patient has her routine blood work done through her primary physician and no other blood work besides thyroid was ordered. Follow up ultrasound otherwise annually.    Dara Lords MD, 12:14 PM 08/28/2012

## 2012-08-28 NOTE — Telephone Encounter (Signed)
Diflucan 150mg x 1 dose

## 2012-08-28 NOTE — Addendum Note (Signed)
Addended by: Dayna Barker on: 08/28/2012 12:26 PM   Modules accepted: Orders

## 2012-08-28 NOTE — Patient Instructions (Signed)
Follow up for ultrasound as scheduled 

## 2012-08-28 NOTE — Telephone Encounter (Signed)
Pt was seen today for annual and forgot to ask for a diflucan Rx. Pt said that she has some white vaginal discharge. Please advise

## 2012-08-29 LAB — URINALYSIS W MICROSCOPIC + REFLEX CULTURE
Bacteria, UA: NONE SEEN
Bilirubin Urine: NEGATIVE
Casts: NONE SEEN
Crystals: NONE SEEN
Ketones, ur: NEGATIVE mg/dL
Nitrite: NEGATIVE
Specific Gravity, Urine: 1.023 (ref 1.005–1.030)
Squamous Epithelial / LPF: NONE SEEN
Urobilinogen, UA: 0.2 mg/dL (ref 0.0–1.0)
pH: 6.5 (ref 5.0–8.0)

## 2012-09-03 ENCOUNTER — Encounter: Payer: Self-pay | Admitting: Gynecology

## 2012-09-11 ENCOUNTER — Ambulatory Visit: Payer: PRIVATE HEALTH INSURANCE | Admitting: Gynecology

## 2012-09-11 ENCOUNTER — Other Ambulatory Visit: Payer: PRIVATE HEALTH INSURANCE

## 2012-09-28 ENCOUNTER — Ambulatory Visit (INDEPENDENT_AMBULATORY_CARE_PROVIDER_SITE_OTHER): Payer: PRIVATE HEALTH INSURANCE

## 2012-09-28 ENCOUNTER — Ambulatory Visit (INDEPENDENT_AMBULATORY_CARE_PROVIDER_SITE_OTHER): Payer: PRIVATE HEALTH INSURANCE | Admitting: Gynecology

## 2012-09-28 ENCOUNTER — Encounter: Payer: Self-pay | Admitting: Gynecology

## 2012-09-28 DIAGNOSIS — N921 Excessive and frequent menstruation with irregular cycle: Secondary | ICD-10-CM

## 2012-09-28 DIAGNOSIS — T839XXA Unspecified complication of genitourinary prosthetic device, implant and graft, initial encounter: Secondary | ICD-10-CM

## 2012-09-28 DIAGNOSIS — R102 Pelvic and perineal pain unspecified side: Secondary | ICD-10-CM

## 2012-09-28 DIAGNOSIS — N949 Unspecified condition associated with female genital organs and menstrual cycle: Secondary | ICD-10-CM

## 2012-09-28 DIAGNOSIS — N920 Excessive and frequent menstruation with regular cycle: Secondary | ICD-10-CM

## 2012-09-28 DIAGNOSIS — N831 Corpus luteum cyst of ovary, unspecified side: Secondary | ICD-10-CM

## 2012-09-28 DIAGNOSIS — N938 Other specified abnormal uterine and vaginal bleeding: Secondary | ICD-10-CM

## 2012-09-28 DIAGNOSIS — N8300 Follicular cyst of ovary, unspecified side: Secondary | ICD-10-CM

## 2012-09-28 DIAGNOSIS — N923 Ovulation bleeding: Secondary | ICD-10-CM

## 2012-09-28 MED ORDER — ESTRADIOL 1 MG PO TABS: 1.0000 mg | ORAL_TABLET | Freq: Every day | ORAL | Status: DC

## 2012-09-28 NOTE — Patient Instructions (Signed)
Start on estrogen pill daily for 2 weeks. Call me if irregular bleeding continues or returns.

## 2012-09-28 NOTE — Progress Notes (Signed)
Patient presents for ultrasound due to prolonged spotting with her menses lasting up to 14 days each month. She is Mirena IUD placed a year ago.   Sound shows uterus normal size, endometrial echo 3.8 mm, IUD visualized within the cavity appropriate location. Right left ovaries with physiologic changes. Cul-de-sac negative.   Assessment and plan: DUB type bleeding with IUD. Appears to be in proper location. Reviewed options to include observation, removal and replacement, alternative contraception, trial of low-dose estrogen to see if we can't build up the endometrium and stabilize. Patient was to try the estrogen we'll go with Estrace 1 mg daily for 14 days. Patient will follow up if her irregular bleeding continues.

## 2012-11-06 ENCOUNTER — Telehealth: Payer: Self-pay | Admitting: *Deleted

## 2012-11-06 NOTE — Telephone Encounter (Signed)
Pt called requesting weight prior to IUD placement on 07/23/11, left on voicemail per request 139 lb.

## 2012-11-20 ENCOUNTER — Encounter: Payer: Self-pay | Admitting: Gynecology

## 2012-11-20 ENCOUNTER — Ambulatory Visit (INDEPENDENT_AMBULATORY_CARE_PROVIDER_SITE_OTHER): Payer: PRIVATE HEALTH INSURANCE | Admitting: Gynecology

## 2012-11-20 ENCOUNTER — Ambulatory Visit: Payer: Self-pay | Admitting: Gynecology

## 2012-11-20 DIAGNOSIS — T8389XD Other specified complication of genitourinary prosthetic devices, implants and grafts, subsequent encounter: Secondary | ICD-10-CM

## 2012-11-20 DIAGNOSIS — Z5189 Encounter for other specified aftercare: Secondary | ICD-10-CM

## 2012-11-20 DIAGNOSIS — Z309 Encounter for contraceptive management, unspecified: Secondary | ICD-10-CM

## 2012-11-20 DIAGNOSIS — Z30431 Encounter for routine checking of intrauterine contraceptive device: Secondary | ICD-10-CM

## 2012-11-20 NOTE — Patient Instructions (Signed)
Followup by January 2015 for your annual exam. Sooner for contraceptive counseling as desired.

## 2012-11-20 NOTE — Progress Notes (Signed)
Patient presents to have her IUD removed. She continues to have spotting on and off for up to 14 days. She is not sexual active. Had used birth control pills in the past.  Been treated with Estrace 1 mg daily for 2 weeks with continued spotting despite this. Had normal thyroid. Had ultrasound which also was normal with endometrial echo of 3.8 mm.  Exam with Selena Batten assistant External BUS vagina normal. Cervix normal IUD string visualized. Uterus grossly normal size midline mobile nontender. Adnexa without masses or tenderness.  Procedure: IUD string was grasped with a [place] forcep and her Mirena IUD was removed, shown to her and discarded.  Assessment and plan: Irregular bleeding with IUD. I reviewed contraceptive options to include pill patch ring Nexplanon Depo-Provera sterilization. She's actually thinking of sterilization as she does not desire childbearing. She wants to just wait and use nothing at this time as she is not sexually active and see what the next couple months bring. She'll followup by January 2015 for her annual exam, sooner for contraceptive discussion or consideration of sterilization as she chooses. Absolute irreversibility of sterilization was reviewed with her.

## 2013-01-05 ENCOUNTER — Other Ambulatory Visit: Payer: Self-pay | Admitting: Gynecology

## 2013-03-22 ENCOUNTER — Ambulatory Visit (INDEPENDENT_AMBULATORY_CARE_PROVIDER_SITE_OTHER): Payer: PRIVATE HEALTH INSURANCE | Admitting: Sports Medicine

## 2013-03-22 ENCOUNTER — Ambulatory Visit (INDEPENDENT_AMBULATORY_CARE_PROVIDER_SITE_OTHER): Payer: PRIVATE HEALTH INSURANCE

## 2013-03-22 ENCOUNTER — Encounter: Payer: Self-pay | Admitting: Sports Medicine

## 2013-03-22 VITALS — BP 132/76 | HR 77 | Wt 157.0 lb

## 2013-03-22 DIAGNOSIS — M25569 Pain in unspecified knee: Secondary | ICD-10-CM

## 2013-03-22 DIAGNOSIS — M25562 Pain in left knee: Secondary | ICD-10-CM

## 2013-03-22 MED ORDER — MELOXICAM 15 MG PO TABS: ORAL_TABLET | ORAL | Status: DC

## 2013-03-22 NOTE — Progress Notes (Signed)
  Subjective:    CC: Left knee pain   HPI:  This is an extremely pleasant 37 year old female who is seen with a several week complaint of pain which he localizes on the posterior aspect of her left knee. She denies any trauma, the pain began gradually, it radiates to the medial joint line, but not to the anterior knee, she does experience gelling and stiffness after being still for a long period of time. She tried 800 mg of ibuprofen which has been ineffective so far. Pain is moderate, persistent. No mechanical symptoms with the exception of a slight pop on the lateral aspect of the knee which is not present today.  Past medical history, Surgical history, Family history not pertinant except as noted below, Social history, Allergies, and medications have been entered into the medical record, reviewed, and no changes needed.   Review of Systems: No headache, visual changes, nausea, vomiting, diarrhea, constipation, dizziness, abdominal pain, skin rash, fevers, chills, night sweats, swollen lymph nodes, weight loss, chest pain, body aches, joint swelling, muscle aches, shortness of breath, mood changes, visual or auditory hallucinations.  Objective:    General: Well Developed, well nourished, and in no acute distress.  Neuro: Alert and oriented x3, extra-ocular muscles intact, sensation grossly intact.  HEENT: Normocephalic, atraumatic, pupils equal round reactive to light, neck supple, no masses, no lymphadenopathy, thyroid nonpalpable.  Skin: Warm and dry, no rashes noted.  Cardiac: Regular rate and rhythm, no murmurs rubs or gallops.  Respiratory: Clear to auscultation bilaterally. Not using accessory muscles, speaking in full sentences.  Abdominal: Soft, nontender, nondistended, positive bowel sounds, no masses, no organomegaly.  Left Knee: There is only a trace visible effusion. There's tenderness to palpation at the medial joint line. ROM full in flexion and extension and lower leg  rotation. Ligaments with solid consistent endpoints including ACL, PCL, LCL, MCL. Negative Mcmurray's, Apley's, and Thessalonian tests. Non painful patellar compression. Patellar glide without crepitus. Patellar and quadriceps tendons unremarkable. Hamstring and quadriceps strength is normal.   X-rays reviewed and there are minimal degenerative changes particularly of the medial tibiofemoral compartment with small osteophytes as well as inferior patellar osteophyte.  Impression and Recommendations:    The patient was counselled, risk factors were discussed, anticipatory guidance given.

## 2013-03-22 NOTE — Assessment & Plan Note (Signed)
This is localized to the medial joint line, with a strong family history of osteoarthritis. 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 Home exercises, Mobic, knee brace, x-rays. Return in one month, consider injection if no better.

## 2013-04-20 ENCOUNTER — Ambulatory Visit: Payer: PRIVATE HEALTH INSURANCE | Admitting: Sports Medicine

## 2013-06-27 ENCOUNTER — Telehealth: Payer: Self-pay | Admitting: Family Medicine

## 2013-06-27 NOTE — ED Notes (Signed)
Pt with worsening sciatic flare at work On L side/buttock area with some radiation.  Pain 10/10 at times.  No known injury.   Pt given depomedrol 40 mg IM x1 here in clinic.  Continue mobic at home Followup with PCP if sxs persist.    Doree Albee, MD 06/27/13 1329

## 2013-07-12 ENCOUNTER — Encounter: Payer: Self-pay | Admitting: Sports Medicine

## 2013-07-12 ENCOUNTER — Ambulatory Visit (INDEPENDENT_AMBULATORY_CARE_PROVIDER_SITE_OTHER): Payer: PRIVATE HEALTH INSURANCE | Admitting: Sports Medicine

## 2013-07-12 VITALS — BP 135/80 | HR 79 | Wt 152.0 lb

## 2013-07-12 DIAGNOSIS — IMO0002 Reserved for concepts with insufficient information to code with codable children: Secondary | ICD-10-CM

## 2013-07-12 DIAGNOSIS — M5416 Radiculopathy, lumbar region: Secondary | ICD-10-CM | POA: Insufficient documentation

## 2013-07-12 DIAGNOSIS — M25569 Pain in unspecified knee: Secondary | ICD-10-CM

## 2013-07-12 DIAGNOSIS — M25562 Pain in left knee: Secondary | ICD-10-CM

## 2013-07-12 MED ORDER — PREDNISONE 50 MG PO TABS: ORAL_TABLET | ORAL | Status: DC

## 2013-07-12 NOTE — Progress Notes (Signed)
  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 Since then she's had on and off pain in her back radiating down her leg, left-sided, S1 distribution, worse with Valsalva, worse with sitting for long periods of time. She was treated multiple times by generalist who recommended bed rest. Symptoms are moderate, persistent. No bowel or bladder dysfunction, no constitutional symptoms.  Past medical history, Surgical history, Family history not pertinant except as noted below, Social history, Allergies, and medications have been entered into the medical record, reviewed, and no changes needed.   Review of Systems: No fevers, chills, night sweats, weight loss, chest pain, or shortness of breath.   Objective:    General: Well Developed, well nourished, and in no acute distress.  Neuro: Alert and oriented x3, extra-ocular muscles intact, sensation grossly intact.  HEENT: Normocephalic, atraumatic, pupils equal round reactive to light, neck supple, no masses, no lymphadenopathy, thyroid nonpalpable.  Skin: Warm and dry, no rashes. Cardiac: Regular rate and rhythm, no murmurs rubs or gallops, no lower extremity edema.  Respiratory: Clear to auscultation bilaterally. Not using accessory muscles, speaking in full sentences. Back Exam:  Inspection: Unremarkable  Motion: Flexion 45 deg, Extension 45 deg, Side Bending to 45 deg bilaterally,  Rotation to 45 deg bilaterally  SLR laying: Negative  XSLR laying: Negative  Palpable tenderness: None. FABER: negative. Sensory change: Gross sensation intact to all lumbar and sacral dermatomes.  Reflexes: 2+ at both patellar tendons, 2+ at achilles tendons, Babinski's downgoing.  Strength at foot  Plantar-flexion: 5/5 Dorsi-flexion: 5/5 Eversion: 5/5 Inversion: 5/5  Leg  strength  Quad: 5/5 Hamstring: 5/5 Hip flexor: 5/5 Hip abductors: 5/5  Gait unremarkable.  Impression and Recommendations:

## 2013-07-12 NOTE — Assessment & Plan Note (Signed)
Likely left S1. Prednisone, Mobic, formal physical therapy. Return in one month, MRI for interventional injection planning if no better.

## 2013-07-12 NOTE — Assessment & Plan Note (Signed)
Resolved with conservative measures. 

## 2013-07-15 ENCOUNTER — Encounter: Payer: Self-pay | Admitting: Gynecology

## 2013-07-15 ENCOUNTER — Ambulatory Visit (INDEPENDENT_AMBULATORY_CARE_PROVIDER_SITE_OTHER): Payer: PRIVATE HEALTH INSURANCE | Admitting: Gynecology

## 2013-07-15 DIAGNOSIS — Z309 Encounter for contraceptive management, unspecified: Secondary | ICD-10-CM

## 2013-07-15 MED ORDER — NORETHINDRONE ACET-ETHINYL EST 1-20 MG-MCG PO TABS: 1.0000 | ORAL_TABLET | Freq: Every day | ORAL | Status: DC

## 2013-07-15 NOTE — Patient Instructions (Signed)
Start on new birth control pill as we discussed. Followup in January for your annual exam.

## 2013-07-15 NOTE — Progress Notes (Signed)
Patient presents to discuss birth control options. Had been on tri-Sprintec since age 37 and then we stopped it due to recurrent yeast infections. These cleared after stopping it. She had a trial of Mirena IUD which was removed earlier this year due to continued bleeding on and off. Patient's actually interested in sterilization and is not ready to schedule this at this time.  I reviewed contraceptive options to include barrier, progesterone only such as Depo-Provera and nexplanon, oral contraceptives to include NuvaRing and sterilization. The pros/cons, risks/benefits reviewed. I discussed with tubal sterilization that actually salpingectomy as an option for risk reductive surgery as far as ovarian cancer but obviously this would eliminate reanastomoses possibilities. After lengthy discussion she wants a trial of oral contraceptives. She is a different type and start her on Loestrin 120 equivalent. One pack with 3 refills. Patient is due for her annual exam in January and she'll schedule it at that time we'll see how she's doing.

## 2013-08-16 ENCOUNTER — Ambulatory Visit: Payer: PRIVATE HEALTH INSURANCE | Admitting: Physical Therapy

## 2013-08-27 ENCOUNTER — Ambulatory Visit: Payer: PRIVATE HEALTH INSURANCE | Admitting: Physical Therapy

## 2013-09-01 ENCOUNTER — Ambulatory Visit (INDEPENDENT_AMBULATORY_CARE_PROVIDER_SITE_OTHER): Payer: PRIVATE HEALTH INSURANCE

## 2013-09-01 DIAGNOSIS — M6281 Muscle weakness (generalized): Secondary | ICD-10-CM

## 2013-09-01 DIAGNOSIS — M25559 Pain in unspecified hip: Secondary | ICD-10-CM

## 2013-09-01 DIAGNOSIS — M545 Low back pain, unspecified: Secondary | ICD-10-CM

## 2013-09-01 DIAGNOSIS — IMO0002 Reserved for concepts with insufficient information to code with codable children: Secondary | ICD-10-CM

## 2013-09-06 ENCOUNTER — Encounter (INDEPENDENT_AMBULATORY_CARE_PROVIDER_SITE_OTHER): Payer: PRIVATE HEALTH INSURANCE | Admitting: Physical Therapy

## 2013-09-06 DIAGNOSIS — IMO0002 Reserved for concepts with insufficient information to code with codable children: Secondary | ICD-10-CM

## 2013-09-06 DIAGNOSIS — M545 Low back pain, unspecified: Secondary | ICD-10-CM

## 2013-09-06 DIAGNOSIS — M6281 Muscle weakness (generalized): Secondary | ICD-10-CM

## 2013-09-06 DIAGNOSIS — M25559 Pain in unspecified hip: Secondary | ICD-10-CM

## 2013-09-13 ENCOUNTER — Encounter: Payer: PRIVATE HEALTH INSURANCE | Admitting: Physical Therapy

## 2013-12-27 ENCOUNTER — Other Ambulatory Visit (HOSPITAL_COMMUNITY)
Admission: RE | Admit: 2013-12-27 | Discharge: 2013-12-27 | Disposition: A | Discharge status: Home / Self Care | Payer: PRIVATE HEALTH INSURANCE | Source: Ambulatory Visit | Attending: Gynecology | Admitting: Gynecology

## 2013-12-27 ENCOUNTER — Encounter: Payer: Self-pay | Admitting: Gynecology

## 2013-12-27 ENCOUNTER — Ambulatory Visit (INDEPENDENT_AMBULATORY_CARE_PROVIDER_SITE_OTHER): Payer: PRIVATE HEALTH INSURANCE | Admitting: Gynecology

## 2013-12-27 VITALS — BP 112/74 | Ht 63.0 in | Wt 154.0 lb

## 2013-12-27 DIAGNOSIS — Z01419 Encounter for gynecological examination (general) (routine) without abnormal findings: Secondary | ICD-10-CM | POA: Insufficient documentation

## 2013-12-27 DIAGNOSIS — Z1151 Encounter for screening for human papillomavirus (HPV): Secondary | ICD-10-CM | POA: Insufficient documentation

## 2013-12-27 DIAGNOSIS — R6889 Other general symptoms and signs: Secondary | ICD-10-CM

## 2013-12-27 DIAGNOSIS — Z309 Encounter for contraceptive management, unspecified: Secondary | ICD-10-CM

## 2013-12-27 DIAGNOSIS — IMO0002 Reserved for concepts with insufficient information to code with codable children: Secondary | ICD-10-CM

## 2013-12-27 DIAGNOSIS — N898 Other specified noninflammatory disorders of vagina: Secondary | ICD-10-CM

## 2013-12-27 LAB — CBC WITH DIFFERENTIAL/PLATELET
BASOS ABS: 0 10*3/uL (ref 0.0–0.1)
BASOS PCT: 0 % (ref 0–1)
Eosinophils Absolute: 0.2 10*3/uL (ref 0.0–0.7)
Eosinophils Relative: 2 % (ref 0–5)
HCT: 37.2 % (ref 36.0–46.0)
HEMOGLOBIN: 13.2 g/dL (ref 12.0–15.0)
Lymphocytes Relative: 30 % (ref 12–46)
Lymphs Abs: 2.3 10*3/uL (ref 0.7–4.0)
MCH: 31 pg (ref 26.0–34.0)
MCHC: 35.5 g/dL (ref 30.0–36.0)
MCV: 87.3 fL (ref 78.0–100.0)
Monocytes Absolute: 0.4 10*3/uL (ref 0.1–1.0)
Monocytes Relative: 5 % (ref 3–12)
NEUTROS ABS: 4.7 10*3/uL (ref 1.7–7.7)
NEUTROS PCT: 63 % (ref 43–77)
Platelets: 252 10*3/uL (ref 150–400)
RBC: 4.26 MIL/uL (ref 3.87–5.11)
RDW: 12.5 % (ref 11.5–15.5)
WBC: 7.5 10*3/uL (ref 4.0–10.5)

## 2013-12-27 LAB — WET PREP FOR TRICH, YEAST, CLUE
CLUE CELLS WET PREP: NONE SEEN
Trich, Wet Prep: NONE SEEN

## 2013-12-27 LAB — COMPREHENSIVE METABOLIC PANEL
ALBUMIN: 4 g/dL (ref 3.5–5.2)
ALK PHOS: 52 U/L (ref 39–117)
ALT: 11 U/L (ref 0–35)
AST: 13 U/L (ref 0–37)
BUN: 14 mg/dL (ref 6–23)
CO2: 24 mEq/L (ref 19–32)
Calcium: 9.2 mg/dL (ref 8.4–10.5)
Chloride: 103 mEq/L (ref 96–112)
Creat: 0.78 mg/dL (ref 0.50–1.10)
Glucose, Bld: 112 mg/dL — ABNORMAL HIGH (ref 70–99)
POTASSIUM: 3.8 meq/L (ref 3.5–5.3)
SODIUM: 136 meq/L (ref 135–145)
TOTAL PROTEIN: 6.5 g/dL (ref 6.0–8.3)
Total Bilirubin: 0.5 mg/dL (ref 0.2–1.2)

## 2013-12-27 LAB — LIPID PANEL
Cholesterol: 131 mg/dL (ref 0–200)
HDL: 42 mg/dL (ref >39)
LDL CALC: 68 mg/dL (ref 0–99)
Total CHOL/HDL Ratio: 3.1 Ratio
Triglycerides: 105 mg/dL (ref <150)
VLDL: 21 mg/dL (ref 0–40)

## 2013-12-27 MED ORDER — NORETHINDRONE ACET-ETHINYL EST 1-20 MG-MCG PO TABS: 1.0000 | ORAL_TABLET | Freq: Every day | ORAL | Status: DC

## 2013-12-27 MED ORDER — FLUCONAZOLE 150 MG PO TABS: 150.0000 mg | ORAL_TABLET | Freq: Once | ORAL | Status: DC

## 2013-12-27 NOTE — Patient Instructions (Addendum)
Take the one Diflucan pill for the yeast infection. Followup if the vaginal discharge persists, worsens or recurs.  Followup with your decision about contraception. Please stop smoking.  You may obtain a copy of any labs that were done today by logging onto MyChart as outlined in the instructions provided with your AVS (after visit summary). The office will not call with normal lab results but certainly if there are any significant abnormalities then we will contact you.   Health Maintenance, Female A healthy lifestyle and preventative care can promote health and wellness.  Maintain regular health, dental, and eye exams.  Eat a healthy diet. Foods like vegetables, fruits, whole grains, low-fat dairy products, and lean protein foods contain the nutrients you need without too many calories. Decrease your intake of foods high in solid fats, added sugars, and salt. Get information about a proper diet from your caregiver, if necessary.  Regular physical exercise is one of the most important things you can do for your health. Most adults should get at least 150 minutes of moderate-intensity exercise (any activity that increases your heart rate and causes you to sweat) each week. In addition, most adults need muscle-strengthening exercises on 2 or more days a week.   Maintain a healthy weight. The body mass index (BMI) is a screening tool to identify possible weight problems. It provides an estimate of body fat based on height and weight. Your caregiver can help determine your BMI, and can help you achieve or maintain a healthy weight. For adults 20 years and older:  A BMI below 18.5 is considered underweight.  A BMI of 18.5 to 24.9 is normal.  A BMI of 25 to 29.9 is considered overweight.  A BMI of 30 and above is considered obese.  Maintain normal blood lipids and cholesterol by exercising and minimizing your intake of saturated fat. Eat a balanced diet with plenty of fruits and vegetables.  Blood tests for lipids and cholesterol should begin at age 15 and be repeated every 5 years. If your lipid or cholesterol levels are high, you are over 50, or you are a high risk for heart disease, you may need your cholesterol levels checked more frequently.Ongoing high lipid and cholesterol levels should be treated with medicines if diet and exercise are not effective.  If you smoke, find out from your caregiver how to quit. If you do not use tobacco, do not start.  Lung cancer screening is recommended for adults aged 25 80 years who are at high risk for developing lung cancer because of a history of smoking. Yearly low-dose computed tomography (CT) is recommended for people who have at least a 30-pack-year history of smoking and are a current smoker or have quit within the past 15 years. A pack year of smoking is smoking an average of 1 pack of cigarettes a day for 1 year (for example: 1 pack a day for 30 years or 2 packs a day for 15 years). Yearly screening should continue until the smoker has stopped smoking for at least 15 years. Yearly screening should also be stopped for people who develop a health problem that would prevent them from having lung cancer treatment.  If you are pregnant, do not drink alcohol. If you are breastfeeding, be very cautious about drinking alcohol. If you are not pregnant and choose to drink alcohol, do not exceed 1 drink per day. One drink is considered to be 12 ounces (355 mL) of beer, 5 ounces (148 mL) of wine, or  1.5 ounces (44 mL) of liquor.  Avoid use of street drugs. Do not share needles with anyone. Ask for help if you need support or instructions about stopping the use of drugs.  High blood pressure causes heart disease and increases the risk of stroke. Blood pressure should be checked at least every 1 to 2 years. Ongoing high blood pressure should be treated with medicines, if weight loss and exercise are not effective.  If you are 34 to 37 years old, ask your  caregiver if you should take aspirin to prevent strokes.  Diabetes screening involves taking a blood sample to check your fasting blood sugar level. This should be done once every 3 years, after age 35, if you are within normal weight and without risk factors for diabetes. Testing should be considered at a younger age or be carried out more frequently if you are overweight and have at least 1 risk factor for diabetes.  Breast cancer screening is essential preventative care for women. You should practice "breast self-awareness." This means understanding the normal appearance and feel of your breasts and may include breast self-examination. Any changes detected, no matter how small, should be reported to a caregiver. Women in their 26s and 30s should have a clinical breast exam (CBE) by a caregiver as part of a regular health exam every 1 to 3 years. After age 6, women should have a CBE every year. Starting at age 51, women should consider having a mammogram (breast X-ray) every year. Women who have a family history of breast cancer should talk to their caregiver about genetic screening. Women at a high risk of breast cancer should talk to their caregiver about having an MRI and a mammogram every year.  Breast cancer gene (BRCA)-related cancer risk assessment is recommended for women who have family members with BRCA-related cancers. BRCA-related cancers include breast, ovarian, tubal, and peritoneal cancers. Having family members with these cancers may be associated with an increased risk for harmful changes (mutations) in the breast cancer genes BRCA1 and BRCA2. Results of the assessment will determine the need for genetic counseling and BRCA1 and BRCA2 testing.  The Pap test is a screening test for cervical cancer. Women should have a Pap test starting at age 28. Between ages 2 and 33, Pap tests should be repeated every 2 years. Beginning at age 78, you should have a Pap test every 3 years as long as the  past 3 Pap tests have been normal. If you had a hysterectomy for a problem that was not cancer or a condition that could lead to cancer, then you no longer need Pap tests. If you are between ages 59 and 27, and you have had normal Pap tests going back 10 years, you no longer need Pap tests. If you have had past treatment for cervical cancer or a condition that could lead to cancer, you need Pap tests and screening for cancer for at least 20 years after your treatment. If Pap tests have been discontinued, risk factors (such as a new sexual partner) need to be reassessed to determine if screening should be resumed. Some women have medical problems that increase the chance of getting cervical cancer. In these cases, your caregiver may recommend more frequent screening and Pap tests.  The human papillomavirus (HPV) test is an additional test that may be used for cervical cancer screening. The HPV test looks for the virus that can cause the cell changes on the cervix. The cells collected during the Pap  test can be tested for HPV. The HPV test could be used to screen women aged 73 years and older, and should be used in women of any age who have unclear Pap test results. After the age of 23, women should have HPV testing at the same frequency as a Pap test.  Colorectal cancer can be detected and often prevented. Most routine colorectal cancer screening begins at the age of 22 and continues through age 60. However, your caregiver may recommend screening at an earlier age if you have risk factors for colon cancer. On a yearly basis, your caregiver may provide home test kits to check for hidden blood in the stool. Use of a small camera at the end of a tube, to directly examine the colon (sigmoidoscopy or colonoscopy), can detect the earliest forms of colorectal cancer. Talk to your caregiver about this at age 32, when routine screening begins. Direct examination of the colon should be repeated every 5 to 10 years through  age 57, unless early forms of pre-cancerous polyps or small growths are found.  Hepatitis C blood testing is recommended for all people born from [birth year] through 1965 and any individual with known risks for hepatitis C.  Practice safe sex. Use condoms and avoid high-risk sexual practices to reduce the spread of sexually transmitted infections (STIs). Sexually active women aged 84 and younger should be checked for Chlamydia, which is a common sexually transmitted infection. Older women with new or multiple partners should also be tested for Chlamydia. Testing for other STIs is recommended if you are sexually active and at increased risk.  Osteoporosis is a disease in which the bones lose minerals and strength with aging. This can result in serious bone fractures. The risk of osteoporosis can be identified using a bone density scan. Women ages 84 and over and women at risk for fractures or osteoporosis should discuss screening with their caregivers. Ask your caregiver whether you should be taking a calcium supplement or vitamin D to reduce the rate of osteoporosis.  Menopause can be associated with physical symptoms and risks. Hormone replacement therapy is available to decrease symptoms and risks. You should talk to your caregiver about whether hormone replacement therapy is right for you.  Use sunscreen. Apply sunscreen liberally and repeatedly throughout the day. You should seek shade when your shadow is shorter than you. Protect yourself by wearing long sleeves, pants, a wide-brimmed hat, and sunglasses year round, whenever you are outdoors.  Notify your caregiver of new moles or changes in moles, especially if there is a change in shape or color. Also notify your caregiver if a mole is larger than the size of a pencil eraser.  Stay current with your immunizations. Document Released: 02/25/2011 Document Revised: 12/07/2012 Document Reviewed: 02/25/2011 [facility] Patient Information 2014 [place].

## 2013-12-27 NOTE — Addendum Note (Signed)
Addended by: Nelva Nay on: 12/27/2013 12:52 PM   Modules accepted: Orders

## 2013-12-27 NOTE — Progress Notes (Signed)
Lori Cameron 10/08/1975 [id number]        38 y.o.  G1P0010 for annual exam.  Several issues noted below.  Past medical history,surgical history, problem list, medications, allergies, family history and social history were all reviewed and documented as reviewed in the EPIC chart.  ROS:  12 system ROS performed with pertinent positives and negatives included in the history, assessment and plan.  Included Systems: General, HEENT, Neck, Cardiovascular, Pulmonary, Gastrointestinal, Genitourinary, Musculoskeletal, Dermatologic, Endocrine, Hematological, Neurologic, Psychiatric Additional significant findings : None   Exam: Kim assistant Filed Vitals:   12/27/13 1210  BP: 112/74  Height: 5\' 3"  (1.6 m)  Weight: 154 lb (69.854 kg)   General appearance:  Normal affect, orientation and appearance. Skin: Grossly normal HEENT: Without gross lesions.  No cervical or supraclavicular adenopathy. Thyroid normal.  Lungs:  Clear without wheezing, rales or rhonchi Cardiac: RR, without RMG Abdominal:  Soft, nontender, without masses, guarding, rebound, organomegaly or hernia Breasts:  Examined lying and sitting without masses, retractions, discharge or axillary adenopathy. Pelvic:  Ext/BUS/vagina normal with slight white discharge  Cervix normal. Pap/HPV  Uterus anteverted, normal size, shape and contour, midline and mobile nontender   Adnexa  Without masses or tenderness    Anus and perineum  Normal   Rectovaginal  Normal sphincter tone without palpated masses or tenderness.    Assessment/Plan:  38 y.o. G30P0010 female for annual exam with regular menses, oral contraceptives.   1. Contraception. Patient seen in November and was started on oral contraceptives. She now wears me that she does occasionally smoke with her sister when they go out together. Not on a regular basis. I reviewed the risks of thrombosis to include stroke heart attack DVT or contraceptives cigarette smoking and above the age of  32. No safe lower limit of cigarettes discussed. Had used IUD in the past. Her issue with the Mirena wasn't made her feel funny but she was also being treated with an antidepressant the same time. I strongly recommended that she try the Mirena again and stop the oral contraceptives. I also strongly recommended that she do no further smoking. I did refill her oral contraceptives under her promised never to smoke again for one year. Patient will followup in the Mirena IUD if she chooses. 2. Vaginal discharge. Patient notes a slight vaginal discharge on and off for the last several months. Exam and wet prep consistent with yeast. Will treat with Diflucan 150 mg x1 dose. Followup if symptoms persist, worsen or recur. 3. Pap smear 2014 ASCUS with negative high-risk HPV. Pap/HPV today. 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 Pap smears otherwise normal since then. 4. Screening mammographic recommendations between 18 and 40 reviewed. She has no strong family history of breast cancer prefers to wait closer to 79. SBE monthly reviewed. 5. Health maintenance. Baseline CBC comprehensive metabolic panel lipid profile urinalysis ordered. Followup with contraceptives decision, Pap smear results, otherwise annually.   Note: This document was prepared with digital dictation and possible smart phrase technology. Any transcriptional errors that result from this process are unintentional.   Anastasio Auerbach MD, 12:46 PM 12/27/2013

## 2013-12-28 ENCOUNTER — Other Ambulatory Visit: Payer: Self-pay | Admitting: Gynecology

## 2013-12-28 DIAGNOSIS — R7309 Other abnormal glucose: Secondary | ICD-10-CM

## 2013-12-28 LAB — URINALYSIS W MICROSCOPIC + REFLEX CULTURE
Bilirubin Urine: NEGATIVE
CASTS: NONE SEEN
Crystals: NONE SEEN
Glucose, UA: NEGATIVE mg/dL
HGB URINE DIPSTICK: NEGATIVE
Ketones, ur: NEGATIVE mg/dL
LEUKOCYTES UA: NEGATIVE
NITRITE: NEGATIVE
PH: 6.5 (ref 5.0–8.0)
Protein, ur: NEGATIVE mg/dL
SPECIFIC GRAVITY, URINE: 1.02 (ref 1.005–1.030)
Urobilinogen, UA: 0.2 mg/dL (ref 0.0–1.0)

## 2013-12-29 ENCOUNTER — Other Ambulatory Visit: Payer: Self-pay | Admitting: Gynecology

## 2013-12-29 MED ORDER — SULFAMETHOXAZOLE-TMP DS 800-160 MG PO TABS: 1.0000 | ORAL_TABLET | Freq: Two times a day (BID) | ORAL | Status: DC

## 2013-12-30 LAB — URINE CULTURE

## 2013-12-31 ENCOUNTER — Telehealth: Payer: Self-pay | Admitting: Gynecology

## 2013-12-31 ENCOUNTER — Other Ambulatory Visit: Payer: Self-pay | Admitting: Gynecology

## 2013-12-31 DIAGNOSIS — Z3046 Encounter for surveillance of implantable subdermal contraceptive: Secondary | ICD-10-CM

## 2013-12-31 MED ORDER — LEVONORGESTREL 20 MCG/24HR IU IUD: INTRAUTERINE_SYSTEM | Freq: Once | INTRAUTERINE | Status: DC

## 2013-12-31 NOTE — Telephone Encounter (Signed)
12/31/13-I LM VM for pt that her [employer] covers the Mirena & insertion with a $30.00 copay.wl

## 2014-01-03 ENCOUNTER — Other Ambulatory Visit: Payer: Self-pay | Admitting: Gynecology

## 2014-01-03 DIAGNOSIS — N390 Urinary tract infection, site not specified: Secondary | ICD-10-CM

## 2014-01-10 ENCOUNTER — Other Ambulatory Visit: Payer: PRIVATE HEALTH INSURANCE

## 2014-01-11 ENCOUNTER — Encounter: Payer: Self-pay | Admitting: Sports Medicine

## 2014-01-11 ENCOUNTER — Ambulatory Visit: Payer: PRIVATE HEALTH INSURANCE

## 2014-01-11 ENCOUNTER — Ambulatory Visit (INDEPENDENT_AMBULATORY_CARE_PROVIDER_SITE_OTHER): Payer: PRIVATE HEALTH INSURANCE | Admitting: Sports Medicine

## 2014-01-11 VITALS — BP 120/75 | HR 81 | Ht 62.0 in | Wt 156.0 lb

## 2014-01-11 DIAGNOSIS — M5416 Radiculopathy, lumbar region: Secondary | ICD-10-CM

## 2014-01-11 DIAGNOSIS — N390 Urinary tract infection, site not specified: Secondary | ICD-10-CM

## 2014-01-11 DIAGNOSIS — IMO0002 Reserved for concepts with insufficient information to code with codable children: Secondary | ICD-10-CM

## 2014-01-11 MED ORDER — MELOXICAM 15 MG PO TABS: ORAL_TABLET | ORAL | Status: DC

## 2014-01-11 MED ORDER — PREDNISONE 50 MG PO TABS: ORAL_TABLET | ORAL | Status: DC

## 2014-01-11 MED ORDER — CYCLOBENZAPRINE HCL 10 MG PO TABS: ORAL_TABLET | ORAL | Status: DC

## 2014-01-11 NOTE — Assessment & Plan Note (Signed)
Unfortunately somewhat lost to followup, I last saw her in November with left-sided S1 radiculitis. This resolved with conservative measures. Unfortunately her pain is returned, predominantly axial, and discogenic. Prednisone, Mobic, Flexeril, Toradol in the office, formal physical therapy. Return to see me in 2 weeks, MRI for interventional planning if no better.

## 2014-01-11 NOTE — Progress Notes (Signed)
  Subjective:    CC: Back pain.  HPI: This pleasant 38 year old female returns, I saw her back in November of last year left-sided S1 radiculitis. She did extremely well with conservative measures, and was lost to followup. Unfortunately she's had a recurrence of pain that she describes predominantly discogenic, worse with Valsalva, flexion, riding in a car, axial without radicular symptoms, bowel or bladder dysfunction or constitutional symptoms. Severe, persistent.  Past medical history, Surgical history, Family history not pertinant except as noted below, Social history, Allergies, and medications have been entered into the medical record, reviewed, and no changes needed.   Review of Systems: No fevers, chills, night sweats, weight loss, chest pain, or shortness of breath.   Objective:    General: Well Developed, well nourished, and in no acute distress.  Neuro: Alert and oriented x3, extra-ocular muscles intact, sensation grossly intact.  HEENT: Normocephalic, atraumatic, pupils equal round reactive to light, neck supple, no masses, no lymphadenopathy, thyroid nonpalpable.  Skin: Warm and dry, no rashes. Cardiac: Regular rate and rhythm, no murmurs rubs or gallops, no lower extremity edema.  Respiratory: Clear to auscultation bilaterally. Not using accessory muscles, speaking in full sentences.  Back Exam:  Inspection: Unremarkable  Motion: Flexion 45 deg, Extension 45 deg, Side Bending to 45 deg bilaterally,  Rotation to 45 deg bilaterally  SLR laying: Negative  XSLR laying: Negative  Palpable tenderness: None. FABER: negative. Sensory change: Gross sensation intact to all lumbar and sacral dermatomes.  Reflexes: 2+ at both patellar tendons, 2+ at achilles tendons, Babinski's downgoing.  Strength at foot  Plantar-flexion: 5/5 Dorsi-flexion: 5/5 Eversion: 5/5 Inversion: 5/5  Leg strength  Quad: 5/5 Hamstring: 5/5 Hip flexor: 5/5 Hip abductors: 5/5  Gait  unremarkable.  Impression and Recommendations:

## 2014-01-12 LAB — URINALYSIS W MICROSCOPIC + REFLEX CULTURE
BILIRUBIN URINE: NEGATIVE
Casts: NONE SEEN
Crystals: NONE SEEN
Glucose, UA: 250 mg/dL — AB
HGB URINE DIPSTICK: NEGATIVE
KETONES UR: NEGATIVE mg/dL
Leukocytes, UA: NEGATIVE
Nitrite: NEGATIVE
PROTEIN: NEGATIVE mg/dL
Specific Gravity, Urine: 1.026 (ref 1.005–1.030)
UROBILINOGEN UA: 0.2 mg/dL (ref 0.0–1.0)
pH: 7 (ref 5.0–8.0)

## 2014-01-12 MED ORDER — KETOROLAC TROMETHAMINE 30 MG/ML IJ SOLN
30.0000 mg | Freq: Once | INTRAMUSCULAR | Status: AC
Start: 2014-01-12 — End: 2014-01-12
  Administered 2014-01-12: 30 mg via INTRAMUSCULAR

## 2014-01-12 NOTE — Addendum Note (Signed)
Addended by: Doree Albee on: 01/12/2014 11:46 AM   Modules accepted: Orders

## 2014-01-14 ENCOUNTER — Other Ambulatory Visit: Payer: Self-pay | Admitting: Gynecology

## 2014-01-14 LAB — URINE CULTURE

## 2014-01-14 MED ORDER — CIPROFLOXACIN HCL 250 MG PO TABS: 250.0000 mg | ORAL_TABLET | Freq: Two times a day (BID) | ORAL | Status: DC

## 2014-01-24 ENCOUNTER — Ambulatory Visit: Payer: PRIVATE HEALTH INSURANCE | Admitting: Physical Therapy

## 2014-01-25 ENCOUNTER — Ambulatory Visit: Payer: PRIVATE HEALTH INSURANCE | Admitting: Sports Medicine

## 2014-01-26 ENCOUNTER — Encounter: Payer: PRIVATE HEALTH INSURANCE | Admitting: Physical Therapy

## 2014-01-31 ENCOUNTER — Encounter: Payer: PRIVATE HEALTH INSURANCE | Admitting: Physical Therapy

## 2014-02-02 ENCOUNTER — Ambulatory Visit: Payer: PRIVATE HEALTH INSURANCE | Admitting: Physical Therapy

## 2014-02-02 ENCOUNTER — Encounter: Payer: PRIVATE HEALTH INSURANCE | Admitting: Physical Therapy

## 2014-02-09 ENCOUNTER — Ambulatory Visit: Payer: PRIVATE HEALTH INSURANCE | Admitting: Physical Therapy

## 2014-03-01 ENCOUNTER — Ambulatory Visit: Payer: PRIVATE HEALTH INSURANCE | Attending: Sports Medicine | Admitting: Physical Therapy

## 2014-03-01 DIAGNOSIS — IMO0002 Reserved for concepts with insufficient information to code with codable children: Secondary | ICD-10-CM | POA: Insufficient documentation

## 2014-03-01 DIAGNOSIS — M545 Low back pain, unspecified: Secondary | ICD-10-CM | POA: Insufficient documentation

## 2014-03-01 DIAGNOSIS — R5381 Other malaise: Secondary | ICD-10-CM | POA: Diagnosis not present

## 2014-03-01 DIAGNOSIS — IMO0001 Reserved for inherently not codable concepts without codable children: Secondary | ICD-10-CM | POA: Diagnosis present

## 2014-03-01 DIAGNOSIS — J45909 Unspecified asthma, uncomplicated: Secondary | ICD-10-CM | POA: Diagnosis not present

## 2014-03-01 DIAGNOSIS — I10 Essential (primary) hypertension: Secondary | ICD-10-CM | POA: Diagnosis not present

## 2014-03-08 ENCOUNTER — Ambulatory Visit: Payer: PRIVATE HEALTH INSURANCE

## 2014-03-08 DIAGNOSIS — IMO0001 Reserved for inherently not codable concepts without codable children: Secondary | ICD-10-CM | POA: Diagnosis not present

## 2014-03-10 ENCOUNTER — Ambulatory Visit: Payer: PRIVATE HEALTH INSURANCE

## 2014-03-10 DIAGNOSIS — IMO0001 Reserved for inherently not codable concepts without codable children: Secondary | ICD-10-CM | POA: Diagnosis not present

## 2014-03-15 ENCOUNTER — Ambulatory Visit: Payer: PRIVATE HEALTH INSURANCE

## 2014-03-17 ENCOUNTER — Ambulatory Visit: Payer: PRIVATE HEALTH INSURANCE

## 2014-03-22 ENCOUNTER — Ambulatory Visit: Payer: PRIVATE HEALTH INSURANCE

## 2014-03-24 ENCOUNTER — Ambulatory Visit: Payer: PRIVATE HEALTH INSURANCE

## 2014-04-06 ENCOUNTER — Ambulatory Visit (INDEPENDENT_AMBULATORY_CARE_PROVIDER_SITE_OTHER): Payer: PRIVATE HEALTH INSURANCE | Admitting: Women's Health

## 2014-04-06 ENCOUNTER — Encounter: Payer: Self-pay | Admitting: Women's Health

## 2014-04-06 DIAGNOSIS — N760 Acute vaginitis: Secondary | ICD-10-CM

## 2014-04-06 DIAGNOSIS — B9689 Other specified bacterial agents as the cause of diseases classified elsewhere: Secondary | ICD-10-CM

## 2014-04-06 DIAGNOSIS — N898 Other specified noninflammatory disorders of vagina: Secondary | ICD-10-CM

## 2014-04-06 DIAGNOSIS — B3731 Acute candidiasis of vulva and vagina: Secondary | ICD-10-CM

## 2014-04-06 DIAGNOSIS — R350 Frequency of micturition: Secondary | ICD-10-CM

## 2014-04-06 DIAGNOSIS — A499 Bacterial infection, unspecified: Secondary | ICD-10-CM

## 2014-04-06 DIAGNOSIS — B373 Candidiasis of vulva and vagina: Secondary | ICD-10-CM

## 2014-04-06 LAB — URINALYSIS W MICROSCOPIC + REFLEX CULTURE
BILIRUBIN URINE: NEGATIVE
Glucose, UA: NEGATIVE mg/dL
Hgb urine dipstick: NEGATIVE
Ketones, ur: NEGATIVE mg/dL
LEUKOCYTES UA: NEGATIVE
NITRITE: NEGATIVE
PROTEIN: NEGATIVE mg/dL
SPECIFIC GRAVITY, URINE: 1.01 (ref 1.005–1.030)
Urobilinogen, UA: 0.2 mg/dL (ref 0.0–1.0)
pH: 7 (ref 5.0–8.0)

## 2014-04-06 LAB — WET PREP FOR TRICH, YEAST, CLUE: Trich, Wet Prep: NONE SEEN

## 2014-04-06 MED ORDER — FLUCONAZOLE 150 MG PO TABS: 150.0000 mg | ORAL_TABLET | Freq: Once | ORAL | Status: DC

## 2014-04-06 MED ORDER — METRONIDAZOLE 0.75 % VA GEL: 1.0000 | Freq: Two times a day (BID) | VAGINAL | Status: DC

## 2014-04-06 NOTE — Progress Notes (Signed)
Patient ID: Lori Cameron, female   DOB: 01-08-1976, 38 y.o.   MRN: [id number] Presents with complaint and increased vaginal discharge with irritation. Started back on birth control pills  and has had increased yeast symptoms,  Had yeast problem in the past whenever on birth control pills. Has tried Mirena but had a full year of spotting. States no spotting  after Mirena removed. One partner greater than 3 years. Denies urinary symptoms, abdominal pain or fever.  Exam: Appears well. External genitalia erythematous at introitus, speculum exam moderate amount of white curdy discharge noted. Wet prep positive for yeast, clues, and TNTC bacteria. Bimanual no CMT or adnexal fullness or tenderness.  Yeast vaginitis Bacteria vaginosis Contraception management  Plan: Diflucan 150 mg  with refill, MetroGel vaginal cream 1 applicator at bedtime x5, alcohol precautions reviewed. Instructed to call if no relief of discharge. Contraception options reviewed, will continue condoms. Plan B emergency contraception reviewed.

## 2014-04-06 NOTE — Patient Instructions (Signed)
Bacterial Vaginosis Bacterial vaginosis is an infection of the vagina. It happens when too many of certain germs (bacteria) grow in the vagina. HOME CARE  Take your medicine as told by your doctor.  Finish your medicine even if you start to feel better.  Do not have sex until you finish your medicine and are better.  Tell your sex partner that you have an infection. They should see their doctor for treatment.  Practice safe sex. Use condoms. Have only one sex partner. GET HELP IF:  You are not getting better after 3 days of treatment.  You have more grey fluid (discharge) coming from your vagina than before.  You have more pain than before.  You have a fever. MAKE SURE YOU:   Understand these instructions.  Will watch your condition.  Will get help right away if you are not doing well or get worse. Document Released: 05/21/2008 Document Revised: 06/02/2013 Document Reviewed: 03/24/2013 [employer] Patient Information 2015 [employer]. This information is not intended to replace advice given to you by your health care provider. Make sure you discuss any questions you have with your health care provider.  

## 2014-04-07 ENCOUNTER — Telehealth: Payer: Self-pay | Admitting: *Deleted

## 2014-04-07 NOTE — Telephone Encounter (Signed)
Pt called to get directions from OV 04/06/14 OV for metrogel per note gel should be 1 applicator nightly x 5 nights. Pt aware of this.

## 2014-06-27 ENCOUNTER — Encounter: Payer: Self-pay | Admitting: Women's Health

## 2014-12-29 ENCOUNTER — Ambulatory Visit (INDEPENDENT_AMBULATORY_CARE_PROVIDER_SITE_OTHER): Payer: PRIVATE HEALTH INSURANCE | Admitting: Gynecology

## 2014-12-29 ENCOUNTER — Encounter: Payer: Self-pay | Admitting: Gynecology

## 2014-12-29 VITALS — BP 122/78

## 2014-12-29 DIAGNOSIS — N760 Acute vaginitis: Secondary | ICD-10-CM

## 2014-12-29 LAB — WET PREP FOR TRICH, YEAST, CLUE
CLUE CELLS WET PREP: NONE SEEN
Trich, Wet Prep: NONE SEEN

## 2014-12-29 MED ORDER — FLUCONAZOLE 150 MG PO TABS: 150.0000 mg | ORAL_TABLET | Freq: Once | ORAL | Status: DC

## 2014-12-29 NOTE — Addendum Note (Signed)
Addended by: Nelva Nay on: 12/29/2014 11:35 AM   Modules accepted: Orders

## 2014-12-29 NOTE — Progress Notes (Signed)
Lori Cameron [birth year] [id number]        39 y.o.  G1P0010 presents with several weeks of intermittent clitoral irritation. Over the last several days to increased vaginal discharge and generalized irritation. No odor. No frequency dysuria or urgency. No fever or chills.  Past medical history,surgical history, problem list, medications, allergies, family history and social history were all reviewed and documented in the EPIC chart.  Directed ROS with pertinent positives and negatives documented in the history of present illness/assessment and plan.  Exam: Kim assistant Filed Vitals:   12/29/14 1029  BP: 122/78   General appearance:  Normal Abdomen soft nontender without masses guarding rebound Pelvic external BUS vagina with thick white discharge. Cervix grossly normal. Uterus normal size midline mobile nontender. Adnexa without masses or tenderness.  Assessment/Plan:  39 y.o. G1P0010 with symptoms and wet prep consistent with yeast vulvovaginitis. Given the length of symptoms and periclitoral involvement am going to treat her with Diflucan 150 mg daily 3 days.  She is on citalopram but a low dose and she understands and accepts the interaction warning read to her.  She has an appointment next week for her annual exam and will follow up at that time.    Anastasio Auerbach MD, 10:49 AM 12/29/2014

## 2014-12-29 NOTE — Patient Instructions (Signed)
Take the Diflucan pill daily for 3 days. Follow up if the irritation and discharge continues. Follow up at your scheduled annual exam appointment.

## 2015-01-03 ENCOUNTER — Encounter: Payer: Self-pay | Admitting: Gynecology

## 2015-01-03 ENCOUNTER — Ambulatory Visit (INDEPENDENT_AMBULATORY_CARE_PROVIDER_SITE_OTHER): Payer: PRIVATE HEALTH INSURANCE | Admitting: Gynecology

## 2015-01-03 VITALS — BP 120/70 | Ht 63.0 in | Wt 153.0 lb

## 2015-01-03 DIAGNOSIS — Z01419 Encounter for gynecological examination (general) (routine) without abnormal findings: Secondary | ICD-10-CM

## 2015-01-03 NOTE — Patient Instructions (Signed)
You may obtain a copy of any labs that were done today by logging onto MyChart as outlined in the instructions provided with your AVS (after visit summary). The office will not call with normal lab results but certainly if there are any significant abnormalities then we will contact you.   Health Maintenance, Female A healthy lifestyle and preventative care can promote health and wellness.  Maintain regular health, dental, and eye exams.  Eat a healthy diet. Foods like vegetables, fruits, whole grains, low-fat dairy products, and lean protein foods contain the nutrients you need without too many calories. Decrease your intake of foods high in solid fats, added sugars, and salt. Get information about a proper diet from your caregiver, if necessary.  Regular physical exercise is one of the most important things you can do for your health. Most adults should get at least 150 minutes of moderate-intensity exercise (any activity that increases your heart rate and causes you to sweat) each week. In addition, most adults need muscle-strengthening exercises on 2 or more days a week.   Maintain a healthy weight. The body mass index (BMI) is a screening tool to identify possible weight problems. It provides an estimate of body fat based on height and weight. Your caregiver can help determine your BMI, and can help you achieve or maintain a healthy weight. For adults 20 years and older:  A BMI below 18.5 is considered underweight.  A BMI of 18.5 to 24.9 is normal.  A BMI of 25 to 29.9 is considered overweight.  A BMI of 30 and above is considered obese.  Maintain normal blood lipids and cholesterol by exercising and minimizing your intake of saturated fat. Eat a balanced diet with plenty of fruits and vegetables. Blood tests for lipids and cholesterol should begin at age 61 and be repeated every 5 years. If your lipid or cholesterol levels are high, you are over 50, or you are a high risk for heart  disease, you may need your cholesterol levels checked more frequently.Ongoing high lipid and cholesterol levels should be treated with medicines if diet and exercise are not effective.  If you smoke, find out from your caregiver how to quit. If you do not use tobacco, do not start.  Lung cancer screening is recommended for adults aged 33 80 years who are at high risk for developing lung cancer because of a history of smoking. Yearly low-dose computed tomography (CT) is recommended for people who have at least a 30-pack-year history of smoking and are a current smoker or have quit within the past 15 years. A pack year of smoking is smoking an average of 1 pack of cigarettes a day for 1 year (for example: 1 pack a day for 30 years or 2 packs a day for 15 years). Yearly screening should continue until the smoker has stopped smoking for at least 15 years. Yearly screening should also be stopped for people who develop a health problem that would prevent them from having lung cancer treatment.  If you are pregnant, do not drink alcohol. If you are breastfeeding, be very cautious about drinking alcohol. If you are not pregnant and choose to drink alcohol, do not exceed 1 drink per day. One drink is considered to be 12 ounces (355 mL) of beer, 5 ounces (148 mL) of wine, or 1.5 ounces (44 mL) of liquor.  Avoid use of street drugs. Do not share needles with anyone. Ask for help if you need support or instructions about stopping  the use of drugs.  High blood pressure causes heart disease and increases the risk of stroke. Blood pressure should be checked at least every 1 to 2 years. Ongoing high blood pressure should be treated with medicines, if weight loss and exercise are not effective.  If you are 59 to 38 years old, ask your caregiver if you should take aspirin to prevent strokes.  Diabetes screening involves taking a blood sample to check your fasting blood sugar level. This should be done once every 3  years, after age 91, if you are within normal weight and without risk factors for diabetes. Testing should be considered at a younger age or be carried out more frequently if you are overweight and have at least 1 risk factor for diabetes.  Breast cancer screening is essential preventative care for women. You should practice "breast self-awareness." This means understanding the normal appearance and feel of your breasts and may include breast self-examination. Any changes detected, no matter how small, should be reported to a caregiver. Women in their 66s and 30s should have a clinical breast exam (CBE) by a caregiver as part of a regular health exam every 1 to 3 years. After age 101, women should have a CBE every year. Starting at age 100, women should consider having a mammogram (breast X-ray) every year. Women who have a family history of breast cancer should talk to their caregiver about genetic screening. Women at a high risk of breast cancer should talk to their caregiver about having an MRI and a mammogram every year.  Breast cancer gene (BRCA)-related cancer risk assessment is recommended for women who have family members with BRCA-related cancers. BRCA-related cancers include breast, ovarian, tubal, and peritoneal cancers. Having family members with these cancers may be associated with an increased risk for harmful changes (mutations) in the breast cancer genes BRCA1 and BRCA2. Results of the assessment will determine the need for genetic counseling and BRCA1 and BRCA2 testing.  The Pap test is a screening test for cervical cancer. Women should have a Pap test starting at age 57. Between ages 25 and 35, Pap tests should be repeated every 2 years. Beginning at age 37, you should have a Pap test every 3 years as long as the past 3 Pap tests have been normal. If you had a hysterectomy for a problem that was not cancer or a condition that could lead to cancer, then you no longer need Pap tests. If you are  between ages 50 and 76, and you have had normal Pap tests going back 10 years, you no longer need Pap tests. If you have had past treatment for cervical cancer or a condition that could lead to cancer, you need Pap tests and screening for cancer for at least 20 years after your treatment. If Pap tests have been discontinued, risk factors (such as a new sexual partner) need to be reassessed to determine if screening should be resumed. Some women have medical problems that increase the chance of getting cervical cancer. In these cases, your caregiver may recommend more frequent screening and Pap tests.  The human papillomavirus (HPV) test is an additional test that may be used for cervical cancer screening. The HPV test looks for the virus that can cause the cell changes on the cervix. The cells collected during the Pap test can be tested for HPV. The HPV test could be used to screen women aged 44 years and older, and should be used in women of any age  who have unclear Pap test results. After the age of 55, women should have HPV testing at the same frequency as a Pap test.  Colorectal cancer can be detected and often prevented. Most routine colorectal cancer screening begins at the age of 44 and continues through age 20. However, your caregiver may recommend screening at an earlier age if you have risk factors for colon cancer. On a yearly basis, your caregiver may provide home test kits to check for hidden blood in the stool. Use of a small camera at the end of a tube, to directly examine the colon (sigmoidoscopy or colonoscopy), can detect the earliest forms of colorectal cancer. Talk to your caregiver about this at age 86, when routine screening begins. Direct examination of the colon should be repeated every 5 to 10 years through age 13, unless early forms of pre-cancerous polyps or small growths are found.  Hepatitis C blood testing is recommended for all people born from [birth year] through 1964 and any  individual with known risks for hepatitis C.  Practice safe sex. Use condoms and avoid high-risk sexual practices to reduce the spread of sexually transmitted infections (STIs). Sexually active women aged 36 and younger should be checked for Chlamydia, which is a common sexually transmitted infection. Older women with new or multiple partners should also be tested for Chlamydia. Testing for other STIs is recommended if you are sexually active and at increased risk.  Osteoporosis is a disease in which the bones lose minerals and strength with aging. This can result in serious bone fractures. The risk of osteoporosis can be identified using a bone density scan. Women ages 20 and over and women at risk for fractures or osteoporosis should discuss screening with their caregivers. Ask your caregiver whether you should be taking a calcium supplement or vitamin D to reduce the rate of osteoporosis.  Menopause can be associated with physical symptoms and risks. Hormone replacement therapy is available to decrease symptoms and risks. You should talk to your caregiver about whether hormone replacement therapy is right for you.  Use sunscreen. Apply sunscreen liberally and repeatedly throughout the day. You should seek shade when your shadow is shorter than you. Protect yourself by wearing long sleeves, pants, a wide-brimmed hat, and sunglasses year round, whenever you are outdoors.  Notify your caregiver of new moles or changes in moles, especially if there is a change in shape or color. Also notify your caregiver if a mole is larger than the size of a pencil eraser.  Stay current with your immunizations. Document Released: 02/25/2011 Document Revised: 12/07/2012 Document Reviewed: 02/25/2011 [facility] Patient Information 2014 [employer].

## 2015-01-03 NOTE — Progress Notes (Signed)
Lori Cameron August 15, 1976 [id number]        39 y.o.  G1P0010 for annual exam.  Doing well without complaints.  Past medical history,surgical history, problem list, medications, allergies, family history and social history were all reviewed and documented as reviewed in the EPIC chart.  ROS:  Performed with pertinent positives and negatives included in the history, assessment and plan.   Additional significant findings :  none   Exam: Kim Counsellor Vitals:   01/03/15 1212  BP: 120/70  Height: 5\' 3"  (1.6 m)  Weight: 153 lb (69.4 kg)   General appearance:  Normal affect, orientation and appearance. Skin: Grossly normal HEENT: Without gross lesions.  No cervical or supraclavicular adenopathy. Thyroid normal.  Lungs:  Clear without wheezing, rales or rhonchi Cardiac: RR, without RMG Abdominal:  Soft, nontender, without masses, guarding, rebound, organomegaly or hernia Breasts:  Examined lying and sitting without masses, retractions, discharge or axillary adenopathy. Pelvic:  Ext/BUS/vagina normal  Cervix normal  Uterus anteverted, normal size, shape and contour, midline and mobile nontender   Adnexa  Without masses or tenderness    Anus and perineum  Normal   Rectovaginal  Normal sphincter tone without palpated masses or tenderness.    Assessment/Plan:  39 y.o. G14P0010 female for annual exam with regular menses, condom contraception..   1. Recent evaluation for vaginitis treated with Diflucan. Patient notes her itching and discharge has pre-much cleared. She has a little bit of periclitoral irritation remaining and I recommended an OTC antifungal cream nightly 7 days. Follow up if symptoms persist or recur. 2. Contraception.  Patient using condoms. Discussed alternatives up to and including sterilization. At this point the patient is not interested in doing anything different but continuing with condoms. She clearly understands the failure risks and is comfortable with this.   Availability of Plan B. 3. Pap smear/HPV negative 12/2013. No Pap smear done today.  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 ASCUS with negative high-risk HPV 2014. 4. Breast health. Screening mammographic recommendations between 47 and 40 reviewed.  She has no strong family history of breast cancer and prefers to wait closer to 34. SBE monthly reviewed. 5. Health maintenance. No routine blood work done as patient relates this done at her primary physician's office. Follow up in one year, sooner as needed.     Anastasio Auerbach MD, 12:31 PM 01/03/2015

## 2015-02-02 ENCOUNTER — Telehealth: Payer: Self-pay | Admitting: Sports Medicine

## 2015-02-02 DIAGNOSIS — M5416 Radiculopathy, lumbar region: Secondary | ICD-10-CM

## 2015-02-02 NOTE — Telephone Encounter (Signed)
Persistent pain, lost to follow-up and did not keep appointments, at this point she does need MRI.

## 2015-02-06 ENCOUNTER — Ambulatory Visit (INDEPENDENT_AMBULATORY_CARE_PROVIDER_SITE_OTHER): Payer: PRIVATE HEALTH INSURANCE

## 2015-02-06 DIAGNOSIS — M5127 Other intervertebral disc displacement, lumbosacral region: Secondary | ICD-10-CM | POA: Diagnosis not present

## 2015-02-06 DIAGNOSIS — M5416 Radiculopathy, lumbar region: Secondary | ICD-10-CM

## 2015-02-13 ENCOUNTER — Ambulatory Visit (INDEPENDENT_AMBULATORY_CARE_PROVIDER_SITE_OTHER): Payer: PRIVATE HEALTH INSURANCE | Admitting: Sports Medicine

## 2015-02-13 ENCOUNTER — Encounter: Payer: Self-pay | Admitting: Sports Medicine

## 2015-02-13 ENCOUNTER — Other Ambulatory Visit: Payer: PRIVATE HEALTH INSURANCE

## 2015-02-13 DIAGNOSIS — M5416 Radiculopathy, lumbar region: Secondary | ICD-10-CM

## 2015-02-13 NOTE — Progress Notes (Signed)
  Subjective:    CC: Follow-up  HPI: This pleasant 39 year old female comes back, she has back pain bilateral with left-sided S1 and L5 radicular symptoms, she has symptoms both on the outside of her left foot as well as the anterior aspect of her left shin paresthesias. She was lost to follow-up initially but she has had physical therapy, as well as multiple oral medications. Nothing has helped her symptoms so she is here to go over MRI is for interventional planning.  Past medical history, Surgical history, Family history not pertinant except as noted below, Social history, Allergies, and medications have been entered into the medical record, reviewed, and no changes needed.   Review of Systems: No fevers, chills, night sweats, weight loss, chest pain, or shortness of breath.   Objective:    General: Well Developed, well nourished, and in no acute distress.  Neuro: Alert and oriented x3, extra-ocular muscles intact, sensation grossly intact.  HEENT: Normocephalic, atraumatic, pupils equal round reactive to light, neck supple, no masses, no lymphadenopathy, thyroid nonpalpable.  Skin: Warm and dry, no rashes. Cardiac: Regular rate and rhythm, no murmurs rubs or gallops, no lower extremity edema.  Respiratory: Clear to auscultation bilaterally. Not using accessory muscles, speaking in full sentences.  MRI shows an L5-S1 disc protrusion predominantly with a right-sided paracentral component.  Impression and Recommendations:

## 2015-02-13 NOTE — Assessment & Plan Note (Signed)
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 There is a moderate-sized L5-S1 disc protrusion, which interestingly has predominantly right-sided component. Considering her symptoms we are going to proceed with a left-sided L5-S1 interlaminar epidural. If insufficient response we will proceed with a nerve conduction study to further localize the location of neural compression.

## 2015-02-23 ENCOUNTER — Other Ambulatory Visit: Payer: PRIVATE HEALTH INSURANCE

## 2015-02-24 ENCOUNTER — Other Ambulatory Visit: Payer: PRIVATE HEALTH INSURANCE

## 2015-02-24 ENCOUNTER — Ambulatory Visit
Admission: RE | Admit: 2015-02-24 | Discharge: 2015-02-24 | Disposition: A | Discharge status: Home / Self Care | Payer: PRIVATE HEALTH INSURANCE | Source: Ambulatory Visit | Attending: Sports Medicine | Admitting: Sports Medicine

## 2015-02-24 VITALS — BP 133/78 | HR 77

## 2015-02-24 DIAGNOSIS — M5416 Radiculopathy, lumbar region: Secondary | ICD-10-CM

## 2015-02-24 MED ORDER — IOHEXOL 180 MG/ML  SOLN
1.0000 mL | Freq: Once | INTRAMUSCULAR | Status: AC | PRN
  Administered 2015-02-24: 1 mL via EPIDURAL

## 2015-02-24 MED ORDER — METHYLPREDNISOLONE ACETATE 40 MG/ML INJ SUSP (RADIOLOG
120.0000 mg | Freq: Once | INTRAMUSCULAR | Status: AC
  Administered 2015-02-24: 120 mg via EPIDURAL

## 2015-02-24 NOTE — Discharge Instructions (Signed)

## 2015-03-26 ENCOUNTER — Emergency Department (INDEPENDENT_AMBULATORY_CARE_PROVIDER_SITE_OTHER)
Admission: EM | Admit: 2015-03-26 | Discharge: 2015-03-26 | Disposition: A | Discharge status: Home / Self Care | Payer: PRIVATE HEALTH INSURANCE | Source: Home / Self Care | Attending: Family Medicine | Admitting: Family Medicine

## 2015-03-26 ENCOUNTER — Encounter: Payer: Self-pay | Admitting: Emergency Medicine

## 2015-03-26 DIAGNOSIS — H00016 Hordeolum externum left eye, unspecified eyelid: Secondary | ICD-10-CM

## 2015-03-26 MED ORDER — CEPHALEXIN 500 MG PO CAPS: 500.0000 mg | ORAL_CAPSULE | Freq: Two times a day (BID) | ORAL | Status: DC

## 2015-03-26 NOTE — ED Provider Notes (Signed)
CSN: [id number]     Arrival date & time 03/26/15  1455 History   First MD Initiated Contact with Patient 03/26/15 1617     Chief Complaint  Patient presents with  . Eye Pain   (Consider location/radiation/quality/duration/timing/severity/associated sxs/prior Treatment) HPI The patient is a 39 year old female presenting to urgent care with complaint of left upper eye pain, redness and swelling that started 3-4 days ago after she was at the beach.  Patient denies getting into the water.  Denies injury to eye.  Patient does not wear glasses or contacts.  She has not tried anything for symptoms.  Pain to left eye is 6/10 at worst aching and sore.  Denies change in vision.  Denies fevers, chills, nausea, vomiting or diarrhea.  Denies nasal congestion or sore throat.  Denies sick contacts.  Denies new soaps, lotions or medications.  Past Medical History  Diagnosis Date  . Condyloma acuminatum     history of  . 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 Asthma   . Depression   . ASCUS (atypical squamous cells of undetermined significance) on Pap smear 1/21014    neg HR HPV  . Hypertension    Past Surgical History  Procedure Laterality Date  . Cervical biopsy  w/ loop electrode excision      CINI MARGINS FREE  . Tonsillectomy and adenoidectomy    . Abdominal surgery      LAPAROTOMY APPENDECTOMY, LYSIS OF ADHESIONS  . Appendectomy     Family History  Problem Relation Age of Onset  . Diabetes Mother   . Hypertension Mother   . Cancer Mother     Cervical  . Hypertension Maternal Grandmother   . Heart disease Maternal Grandmother   . Cancer Maternal Aunt     colon   History  Substance Use Topics  . Smoking status: Passive Smoke Exposure - Never Smoker  . Smokeless tobacco: Never Used  . Alcohol Use: 3.0 oz/week    5 Standard drinks or equivalent per week   OB History    Gravida Para Term Preterm AB TAB  SAB Ectopic Multiple Living   1 0   1     0     Review of Systems  Constitutional: Negative for fever and chills.  HENT: Negative for congestion, ear pain, sore throat, trouble swallowing and voice change.   Eyes: Positive for pain, redness and itching. Negative for photophobia and visual disturbance.       Left upper eyelid  Respiratory: Negative for cough and shortness of breath.   Cardiovascular: Negative for chest pain and palpitations.  Gastrointestinal: Negative for nausea, vomiting, abdominal pain and diarrhea.  Musculoskeletal: Negative for myalgias, back pain and arthralgias.  Skin: Negative for rash.  All other systems reviewed and are negative.   Allergies  Amoxicillin-pot clavulanate and Clindamycin hcl  Home Medications   Prior to Admission medications   Medication Sig Start Date End Date Taking? Authorizing Provider  ALBUTEROL IN Inhale into the lungs. Prn     Historical Provider, MD  cephALEXin (KEFLEX) 500 MG capsule Take 1 capsule (500 mg total) by mouth 2 (two) times daily. For 7 days 03/26/15   Noland Fordyce, PA-C  citalopram (CELEXA) 10 MG tablet Take 10 mg by mouth daily.    Historical Provider, MD  cyclobenzaprine (FLEXERIL) 10 MG tablet One half tab PO qHS, then increase gradually to one tab TID. 01/11/14  Silverio Decamp, MD  hydrochlorothiazide (HYDRODIURIL) 25 MG tablet Take 25 mg by mouth daily.    Historical Provider, MD   BP 130/87 mmHg  Pulse 81  Temp(Src) 98.5 F (36.9 C) (Oral)  Ht 5\' 2"  (1.575 m)  Wt 161 lb (73.029 kg)  BMI 29.44 kg/m2  SpO2 97%  LMP 02/26/2015 Physical Exam  Constitutional: She is oriented to person, place, and time. She appears well-developed and well-nourished.  HENT:  Head: Normocephalic and atraumatic.  Right Ear: Hearing, tympanic membrane, external ear and ear canal normal.  Left Ear: Hearing, tympanic membrane, external ear and ear canal normal.  Nose: Nose normal.  Mouth/Throat: Uvula is midline, oropharynx  is clear and moist and mucous membranes are normal.  Eyes: Conjunctivae and EOM are normal. Pupils are equal, round, and reactive to light. Right eye exhibits no discharge. Left eye exhibits no discharge. No scleral icterus.    Mild edema with erythema of Left upper eyelid. Whitehead on inside of left upper eyelid. No active bleeding or drainage. PERRL, EOM normal. No periorbital edema or tenderness.  Neck: Normal range of motion. Neck supple.  Cardiovascular: Normal rate.   Pulmonary/Chest: Effort normal.  Musculoskeletal: Normal range of motion.  Neurological: She is alert and oriented to person, place, and time.  Skin: Skin is warm and dry.  Psychiatric: She has a normal mood and affect. Her behavior is normal.  Nursing note and vitals reviewed.   ED Course  Procedures (including critical care time) Labs Review Labs Reviewed - No data to display  Imaging Review No results found.   MDM   1. Hordeolum externum, left      Exam consistent with hordeolum.  No evidence of periorbital cellulitis at this time.  Encourage patient to use warm compresses to 3 times a day.  Advised if symptoms not improving in 2-3 days, or if worsening, she may start oral antibiotics-Keflex.   Per up-to-date topical antibiotics may worsen symptoms and should be used by ophthalmologist.  Advised patient if symptoms do not resolve home care or oral antibiotics in 1-2 weeks, she may need referral to ophthalmologist as I&D may need to be performed. Return precautions provided. Patient verbalized understanding and agreement with treatment plan.     Noland Fordyce, PA-C 03/26/15 [phone]

## 2015-03-26 NOTE — ED Notes (Signed)
Patient presents to [facility] with C/O left eye pain with edema since Thursday while [place] the beach. Upper eye lid is edematous

## 2015-03-26 NOTE — Discharge Instructions (Signed)
Please use warm compresses 2-3 times a day for the next 2-3 days, if not improving or if worsening, you may start the oral antibiotic and continue warm compresses.  If symptoms do not resolve in 1-2 weeks you may need referral to an ophthalmologist for further treatment.  Blepharitis Blepharitis is redness, soreness, and swelling (inflammation) of one or both eyelids. It may be caused by an allergic reaction or a bacterial infection. Blepharitis may also be associated with reddened, scaly skin (seborrhea) of the scalp and eyebrows. While you sleep, eye discharge may cause your eyelashes to stick together. Your eyelids may itch, burn, swell, and may lose their lashes. These will grow back. Your eyes may become sensitive. Blepharitis may recur and need repeated treatment. If this is the case, you may require further evaluation by an eye specialist (ophthalmologist). HOME CARE INSTRUCTIONS   Keep your hands clean.  Use a clean towel each time you dry your eyelids. Do not use this towel to clean other areas. Do not share a towel or makeup with anyone.  Wash your eyelids with warm water or warm water mixed with a small amount of baby shampoo. Do this twice a day or as often as needed.  Wash your face and eyebrows at least once a day.  Use warm compresses 2-3 times a day for 10 minutes at a time, or as directed by your caregiver.  Avoid rubbing your eyes.  Avoid wearing makeup until you get better.  Follow up with your caregiver as directed. SEEK IMMEDIATE MEDICAL CARE IF:   You have pain, redness, or swelling that gets worse or spreads to other parts of your face.  Your vision changes, or you have pain when looking at lights or moving objects.  You have a fever.  Your symptoms continue for longer than 2 to 4 days or become worse. MAKE SURE YOU:   Understand these instructions.  Will watch your condition.  Will get help right away if you are not doing well or get worse. Document  Released: 08/09/2000 Document Revised: 11/04/2011 Document Reviewed: 09/19/2010 [facility] Patient Information 2015 [place], [place]. This information is not intended to replace advice given to you by your health care provider. Make sure you discuss any questions you have with your health care provider.

## 2015-09-14 ENCOUNTER — Encounter: Payer: Self-pay | Admitting: Gynecology

## 2015-09-14 ENCOUNTER — Ambulatory Visit (INDEPENDENT_AMBULATORY_CARE_PROVIDER_SITE_OTHER): Payer: PRIVATE HEALTH INSURANCE | Admitting: Gynecology

## 2015-09-14 VITALS — BP 120/76

## 2015-09-14 DIAGNOSIS — N898 Other specified noninflammatory disorders of vagina: Secondary | ICD-10-CM

## 2015-09-14 LAB — WET PREP FOR TRICH, YEAST, CLUE
Trich, Wet Prep: NONE SEEN
Yeast Wet Prep HPF POC: NONE SEEN

## 2015-09-14 MED ORDER — NYSTATIN-TRIAMCINOLONE 100000-0.1 UNIT/GM-% EX OINT: 1.0000 "application " | TOPICAL_OINTMENT | Freq: Two times a day (BID) | CUTANEOUS | Status: DC

## 2015-09-14 MED ORDER — FLUCONAZOLE 200 MG PO TABS: 200.0000 mg | ORAL_TABLET | Freq: Every day | ORAL | Status: DC

## 2015-09-14 NOTE — Patient Instructions (Signed)
Take the Diflucan pills daily for 5 days Use the cream externally 2-3 times daily Follow up if symptoms persist, worsen or recur

## 2015-09-14 NOTE — Progress Notes (Signed)
Lori Cameron [birth year] [id number]        40 y.o.  G1P0010 resents with several months of over itching and discharge. Was evaluated by Izora Gala in August and treated for bacterial vaginosis and yeast with both Diflucan 1 pill and MetroGel. States that she really never got relief with this. She is having a lot of irritation in the periclitoral region.  No dysuria frequency urgency. No odor.  Past medical history,surgical history, problem list, medications, allergies, family history and social history were all reviewed and documented in the EPIC chart.  Directed ROS with pertinent positives and negatives documented in the history of present illness/assessment and plan.  Exam: [name] assistant Filed Vitals:   09/14/15 1213  BP: 120/76   General appearance:  Normal External BUS vagina grossly normal.  White discharge noted. Cervix normal. Bimanual without masses or tenderness  Assessment/Plan:  40 y.o. G1P0010 with history as above. Wet prep shows few clue cells moderate bacteria. Does not show yeast. Given the clinical presentation particularly the external itching and irritation going to treat her as a yeast vaginitis with skin involvement. Will treat with Diflucan 200 mg daily 5 days and Mycolog equivalent cream 2-3 times daily. Follow up if symptoms persist, worsen or recur.    Anastasio Auerbach MD, 12:30 PM 09/14/2015

## 2016-09-03 ENCOUNTER — Emergency Department (HOSPITAL_COMMUNITY)
Admission: EM | Admit: 2016-09-03 | Discharge: 2016-09-03 | Disposition: A | Discharge status: Home / Self Care | Payer: PRIVATE HEALTH INSURANCE | Attending: Emergency Medicine | Admitting: Emergency Medicine

## 2016-09-03 ENCOUNTER — Encounter (HOSPITAL_COMMUNITY): Payer: Self-pay | Admitting: Emergency Medicine

## 2016-09-03 DIAGNOSIS — J45909 Unspecified asthma, uncomplicated: Secondary | ICD-10-CM | POA: Insufficient documentation

## 2016-09-03 DIAGNOSIS — Z7722 Contact with and (suspected) exposure to environmental tobacco smoke (acute) (chronic): Secondary | ICD-10-CM | POA: Diagnosis not present

## 2016-09-03 DIAGNOSIS — L0291 Cutaneous abscess, unspecified: Secondary | ICD-10-CM

## 2016-09-03 DIAGNOSIS — L02214 Cutaneous abscess of groin: Secondary | ICD-10-CM | POA: Diagnosis present

## 2016-09-03 DIAGNOSIS — I10 Essential (primary) hypertension: Secondary | ICD-10-CM | POA: Diagnosis not present

## 2016-09-03 MED ORDER — SULFAMETHOXAZOLE-TRIMETHOPRIM 800-160 MG PO TABS: 1.0000 | ORAL_TABLET | Freq: Two times a day (BID) | ORAL | 0 refills | Status: AC

## 2016-09-03 MED ORDER — HYDROCODONE-ACETAMINOPHEN 5-325 MG PO TABS: 2.0000 | ORAL_TABLET | ORAL | 0 refills | Status: DC | PRN

## 2016-09-03 NOTE — ED Notes (Signed)
Bed: WTR8 Expected date:  Expected time:  Means of arrival:  Comments: 

## 2016-09-03 NOTE — ED Provider Notes (Signed)
[facility] DEPT Provider Note   CSN: [id number] Arrival date & time: 09/03/16  [id number]  By signing my name below, I, Sonum Patel, attest that this documentation has been prepared under the direction and in the presence of Suezanne Jacquet [place], [place] . Electronically Signed: Sonum Patel, Education administrator. 09/03/16. 10:30 AM.  History   Chief Complaint Chief Complaint  Patient presents with  . Abscess   The history is provided by the patient. No language interpreter was used.     HPI Comments: Lori Cameron is a 41 y.o. female who presents to the Emergency Department complaining of a gradual onset, constant, gradually worsened area of pain and swelling to right groin that began over the last week. She reports a history of abscesses and states this feels the same. She attempted to go to an UC last night they were closed. She has applied cool and warm compresses with minimal relief.   Past Medical History:  Diagnosis Date  . ASCUS (atypical squamous cells of undetermined significance) on Pap smear 1/21014   neg HR HPV  . Asthma   . Condyloma acuminatum    history of  . Depression   . 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 Hypertension     Patient Active Problem List   Diagnosis Date Noted  . Left lumbar radiculitis 07/12/2013  . Left knee pain 03/22/2013  . Depression   . Condyloma acuminatum   . Asthma   . ASTHMA 03/09/2011  . HGSIL (high grade squamous intraepithelial dysplasia) 02/23/2009    Past Surgical History:  Procedure Laterality Date  . ABDOMINAL SURGERY     LAPAROTOMY APPENDECTOMY, LYSIS OF ADHESIONS  . APPENDECTOMY    . CERVICAL BIOPSY  W/ LOOP ELECTRODE EXCISION     CINI MARGINS FREE  . TONSILLECTOMY AND ADENOIDECTOMY      OB History    Gravida Para Term Preterm AB Living   1 0     1 0   SAB TAB Ectopic Multiple Live Births                   Home Medications    Prior to  Admission medications   Medication Sig Start Date End Date Taking? Authorizing Provider  ALBUTEROL IN Inhale into the lungs. Prn     Historical Provider, MD  citalopram (CELEXA) 10 MG tablet Take 10 mg by mouth daily.    Historical Provider, MD  cyclobenzaprine (FLEXERIL) 10 MG tablet One half tab PO qHS, then increase gradually to one tab TID. 01/11/14   Silverio Decamp, MD  fluconazole (DIFLUCAN) 200 MG tablet Take 1 tablet (200 mg total) by mouth daily. 09/14/15   Anastasio Auerbach, MD  hydrochlorothiazide (HYDRODIURIL) 25 MG tablet Take 25 mg by mouth daily.    Historical Provider, MD  nystatin-triamcinolone ointment (MYCOLOG) Apply 1 application topically 2 (two) times daily. 09/14/15   Anastasio Auerbach, MD    Family History Family History  Problem Relation Age of Onset  . Diabetes Mother   . Hypertension Mother   . Cancer Mother     Cervical  . Hypertension Maternal Grandmother   . Heart disease Maternal Grandmother   . Cancer Maternal Aunt     colon    Social History Social History  Substance Use Topics  . Smoking status: Passive Smoke Exposure - Never Smoker  . Smokeless tobacco: Never Used  . Alcohol use 3.0  oz/week    5 Standard drinks or equivalent per week     Allergies   Amoxicillin-pot clavulanate and Clindamycin hcl   Review of Systems Review of Systems  Constitutional: Negative for fever.  Skin: Positive for wound.  All other systems reviewed and are negative.    Physical Exam Updated Vital Signs BP 123/99   Pulse 67   Temp 98.9 F (37.2 C) (Oral)   Resp 16   SpO2 95%   Physical Exam  Constitutional: She is oriented to person, place, and time. She appears well-developed and well-nourished.  HENT:  Head: Normocephalic and atraumatic.  Cardiovascular: Normal rate.   Pulmonary/Chest: Effort normal.  Neurological: She is alert and oriented to person, place, and time.  Skin: Skin is warm and dry.  1.4 cm abscess to the right groin  draining pink purulent material  Psychiatric: She has a normal mood and affect. Her behavior is normal.  Nursing note and vitals reviewed.    ED Treatments / Results  DIAGNOSTIC STUDIES: Oxygen Saturation is 95% on RA, adequate by my interpretation.    COORDINATION OF CARE: 10:31 AM Discussed treatment plan with pt at bedside and pt agreed to plan.   Labs (all labs ordered are listed, but only abnormal results are displayed) Labs Reviewed - No data to display  EKG  EKG Interpretation None       Radiology No results found.  Procedures Procedures (including critical care time)  Medications Ordered in ED Medications - No data to display   Initial Impression / Assessment and Plan / ED Course  I have reviewed the triage vital signs and the nursing notes.  Pertinent labs & imaging results that were available during my care of the patient were reviewed by me and considered in my medical decision making (see chart for details).  Clinical Course     Patient with skin abscess. Incision and drainage not needed in the ED today.  Abscess was not large enough to warrant packing or drain placement. Wound recheck in 2 days. Supportive care and return precautions discussed.  Pt sent home with instructions. The patient appears reasonably screened and/or stabilized for discharge and I doubt any other emergent medical condition requiring further screening, evaluation, or treatment in the ED prior to discharge.    Final Clinical Impressions(s) / ED Diagnoses   Final diagnoses:  Abscess    New Prescriptions Discharge Medication List as of 09/03/2016 10:43 AM    START taking these medications   Details  HYDROcodone-acetaminophen (NORCO/VICODIN) 5-325 MG tablet Take 2 tablets by mouth every 4 (four) hours as needed., Starting Tue 09/03/2016, Print    sulfamethoxazole-trimethoprim (BACTRIM DS,SEPTRA DS) 800-160 MG tablet Take 1 tablet by mouth 2 (two) times daily., Starting Tue 09/03/2016,  Until Tue 09/10/2016, Print       An After Visit Summary was printed and given to the patient.  I personally performed the services in this documentation, which was scribed in my presence.  The recorded information has been reviewed and considered.   Ronnald Collum.   Hollace Kinnier [place], PA-C 09/03/16 [place], MD 09/06/16 1040

## 2016-09-03 NOTE — Discharge Instructions (Signed)
Return if any problems.  Soak area

## 2016-09-03 NOTE — ED Triage Notes (Signed)
Pt reports abscess in R vaginal/groin area for the past 6 days. No drainage.

## 2016-11-04 ENCOUNTER — Encounter: Payer: PRIVATE HEALTH INSURANCE | Admitting: Gynecology

## 2017-04-04 ENCOUNTER — Encounter: Payer: PRIVATE HEALTH INSURANCE | Admitting: Gynecology

## 2017-05-12 ENCOUNTER — Ambulatory Visit (INDEPENDENT_AMBULATORY_CARE_PROVIDER_SITE_OTHER): Payer: PRIVATE HEALTH INSURANCE | Admitting: Gynecology

## 2017-05-12 ENCOUNTER — Encounter: Payer: Self-pay | Admitting: Gynecology

## 2017-05-12 VITALS — BP 124/76 | Ht 63.0 in | Wt 165.0 lb

## 2017-05-12 DIAGNOSIS — Z3009 Encounter for other general counseling and advice on contraception: Secondary | ICD-10-CM

## 2017-05-12 DIAGNOSIS — Z01419 Encounter for gynecological examination (general) (routine) without abnormal findings: Secondary | ICD-10-CM

## 2017-05-12 NOTE — Addendum Note (Signed)
Addended by: Nelva Nay on: 05/12/2017 03:44 PM   Modules accepted: Orders

## 2017-05-12 NOTE — Patient Instructions (Signed)
Call to Schedule your mammogram  Facilities in [place]: 1)  [facility]. [facility], [address] Phone: [phone] 2)  Dr. Bertrand at [facility]  [address] Phone: [phone]     Mammogram A mammogram is an X-ray test to find changes in a woman's breast. You should get a mammogram if:  You are 40 years of age or older  You have risk factors.   Your doctor recommends that you have one.  BEFORE THE TEST  Do not schedule the test the week before your period, especially if your breasts are sore during this time.  On the day of your mammogram:  Wash your breasts and armpits well. After washing, do not put on any deodorant or talcum powder on until after your test.   Eat and drink as you usually do.   Take your medicines as usual.   If you are diabetic and take insulin, make sure you:   Eat before coming for your test.   Take your insulin as usual.   If you cannot keep your appointment, call before the appointment to cancel. Schedule another appointment.  TEST  You will need to undress from the waist up. You will put on a hospital gown.   Your breast will be put on the mammogram machine, and it will press firmly on your breast with a piece of plastic called a compression paddle. This will make your breast flatter so that the machine can X-ray all parts of your breast.   Both breasts will be X-rayed. Each breast will be X-rayed from above and from the side. An X-ray might need to be taken again if the picture is not good enough.   The mammogram will last about 15 to 30 minutes.  AFTER THE TEST Finding out the results of your test Ask when your test results will be ready. Make sure you get your test results.  Document Released: 11/08/2008 Document Revised: 08/01/2011 Document Reviewed: 11/08/2008 [employer] Patient Information 2012 [employer].   

## 2017-05-12 NOTE — Progress Notes (Signed)
    Lori Cameron 12-Aug-1976 [id number]        41 y.o.  G1P0010 for annual gynecologic exam.  Wanted to talk to me about tubal sterilization. Also notes he appears a getting a little heavier although coming monthly and predictable. Using barrier contraception for now.  Past medical history,surgical history, problem list, medications, allergies, family history and social history were all reviewed and documented as reviewed in the EPIC chart.  ROS:  Performed with pertinent positives and negatives included in the history, assessment and plan.   Additional significant findings :  None   Exam: [name] assistant Vitals:   05/12/17 1516  BP: 124/76  Weight: 165 lb (74.8 kg)  Height: 5\' 3"  (1.6 m)   Body mass index is 29.23 kg/m.  General appearance:  Normal affect, orientation and appearance. Skin: Grossly normal HEENT: Without gross lesions.  No cervical or supraclavicular adenopathy. Thyroid normal.  Lungs:  Clear without wheezing, rales or rhonchi Cardiac: RR, without RMG Abdominal:  Soft, nontender, without masses, guarding, rebound, organomegaly or hernia Breasts:  Examined lying and sitting without masses, retractions, discharge or axillary adenopathy. Pelvic:  Ext, BUS, Vagina: Normal  Cervix: Normal. Pap smear done  Uterus: Anteverted, normal size, shape and contour, midline and mobile nontender   Adnexa: Without masses or tenderness    Anus and perineum: Normal   Rectovaginal: Normal sphincter tone without palpated masses or tenderness.    Assessment/Plan:  41 y.o. G52P0010 female for annual gynecologic exam with regular menses, condom contraception.   1. Contraception. Patient with questions about tubal sterilization. I reviewed discussed with her in general was involved with the procedure to include the surgical aspects and the general anesthesia with recovery. Her menses are getting a little heavier which I think is physiologic. The remaining regular without  intermenstrual bleeding or other symptoms. Uterus palpates normal today. Reviewed alternative option to include Mirena IUD as this would address both the contraception and the heavier menses. She did have a Mirena IUD a number of years ago and has some spotting with this. One trial does not necessary predict another trial was reviewed with her.  Patient wants to go ahead and try this and she'll schedule an appointment for Mirena IUD placement. 2. Pap smear/HPV -2015. Pap smear done today. She does have a history of HGSIL 2010 with LEEP showing LGSIL with clear margins. Pap smear 2014 showed ASCUS with negative high-risk HPV. 3. Mammography never. Recommended screening mammogram now she has turned 41 and she agrees to call and schedule. Breast exam normal today. 4. Health maintenance. No routine lab work done as patient reports is done elsewhere. Follow up for Mirena IUD. Follow up in one year for annual exam.   Anastasio Auerbach MD, 3:39 PM 05/12/2017

## 2017-05-13 LAB — PAP IG W/ RFLX HPV ASCU

## 2017-05-28 ENCOUNTER — Ambulatory Visit: Payer: PRIVATE HEALTH INSURANCE | Admitting: Gynecology

## 2017-07-11 ENCOUNTER — Other Ambulatory Visit: Payer: Self-pay | Admitting: Internal Medicine

## 2017-07-11 ENCOUNTER — Ambulatory Visit
Admission: RE | Admit: 2017-07-11 | Discharge: 2017-07-11 | Disposition: A | Discharge status: Home / Self Care | Payer: PRIVATE HEALTH INSURANCE | Source: Ambulatory Visit | Attending: Internal Medicine | Admitting: Internal Medicine

## 2017-07-11 DIAGNOSIS — R05 Cough: Secondary | ICD-10-CM

## 2017-07-11 DIAGNOSIS — R059 Cough, unspecified: Secondary | ICD-10-CM

## 2017-08-28 ENCOUNTER — Ambulatory Visit: Payer: PRIVATE HEALTH INSURANCE | Admitting: Gynecology

## 2018-01-07 ENCOUNTER — Encounter: Payer: Self-pay | Admitting: Women's Health

## 2018-01-07 ENCOUNTER — Ambulatory Visit (INDEPENDENT_AMBULATORY_CARE_PROVIDER_SITE_OTHER): Payer: PRIVATE HEALTH INSURANCE | Admitting: Women's Health

## 2018-01-07 ENCOUNTER — Ambulatory Visit (INDEPENDENT_AMBULATORY_CARE_PROVIDER_SITE_OTHER): Payer: PRIVATE HEALTH INSURANCE

## 2018-01-07 VITALS — BP 122/80 | Ht 63.0 in | Wt 171.0 lb

## 2018-01-07 DIAGNOSIS — D251 Intramural leiomyoma of uterus: Secondary | ICD-10-CM | POA: Diagnosis not present

## 2018-01-07 DIAGNOSIS — N946 Dysmenorrhea, unspecified: Secondary | ICD-10-CM

## 2018-01-07 DIAGNOSIS — N83202 Unspecified ovarian cyst, left side: Secondary | ICD-10-CM | POA: Diagnosis not present

## 2018-01-07 DIAGNOSIS — D259 Leiomyoma of uterus, unspecified: Secondary | ICD-10-CM | POA: Insufficient documentation

## 2018-01-07 LAB — WET PREP FOR TRICH, YEAST, CLUE

## 2018-01-07 NOTE — Progress Notes (Signed)
42 year old S WF G1, P0 presents with complaint of left-sided abdominal pain with menses, has had increased  flow and cramping with cycles for the past 2 months.  Pain was severe yesterday especially on left side in the lower pelvis, minimal today.  States changing Tampax and pads every 2 hours, often with clots.  Same partner but there was a break-up.  Had problems with IUD with placement, rare smoking.  Cycles monthly/condoms consistently.  Questioning BTL.  Denies vaginal discharge, urinary symptoms, back pain or fever.  Exam: Appears well.Marland Kitchen  No CVAT.  Abdomen soft, obese, no rebound or radiation of pain with deep palpation.  External genitalia within normal limits, speculum exam scant white discharge wet prep negative.  GC/Chlamydia culture taken.  Bimanual no CMT or adnexal tenderness, no tenderness with exam, questionable enlarged uterus. Ultrasound: T/V images, anteverted uterus intramural fibroid sampling 15 x 17 mm, 19 x 15 mm, 22 x 26 mm.  Tri layered endometrium  11.1 mm.  Right ovary normal.  Left ovary thick-walled irregular shape cyst  internal low level echoes to 17 x 11 mm, positive CFD to periphery.  CDS fluid 20 x 11 mm.  Left-sided abdominal pain/probable left ruptured cyst - resolving Fibroid uterus Contraception management  Plan: Contraception options reviewed, Mirena IUD information discussed slight risk for  infection, hemorrhage or perforation, reviewed we can not use combination OCs smokes 1 to 2 cigarettes daily.  BTL, progestin only contraception also discussed.  Will continue condoms until decides.

## 2018-01-07 NOTE — Patient Instructions (Signed)
Levonorgestrel intrauterine device (IUD) What is this medicine? LEVONORGESTREL IUD (LEE voe nor jes trel) is a contraceptive (birth control) device. The device is placed inside the uterus by a healthcare professional. It is used to prevent pregnancy. This device can also be used to treat heavy bleeding that occurs during your period. This medicine may be used for other purposes; ask your health care provider or pharmacist if you have questions. COMMON BRAND NAME(S): Kyleena, LILETTA, Mirena, Skyla What should I tell my health care provider before I take this medicine? They need to know if you have any of these conditions: -abnormal Pap smear -cancer of the breast, uterus, or cervix -diabetes -endometritis -genital or pelvic infection now or in the past -have more than one sexual partner or your partner has more than one partner -heart disease -history of an ectopic or tubal pregnancy -immune system problems -IUD in place -liver disease or tumor -problems with blood clots or take blood-thinners -seizures -use intravenous drugs -uterus of unusual shape -vaginal bleeding that has not been explained -an unusual or allergic reaction to levonorgestrel, other hormones, silicone, or polyethylene, medicines, foods, dyes, or preservatives -pregnant or trying to get pregnant -breast-feeding How should I use this medicine? This device is placed inside the uterus by a health care professional. Talk to your pediatrician regarding the use of this medicine in children. Special care may be needed. Overdosage: If you think you have taken too much of this medicine contact a poison control center or emergency room at once. NOTE: This medicine is only for you. Do not share this medicine with others. What if I miss a dose? This does not apply. Depending on the brand of device you have inserted, the device will need to be replaced every 3 to 5 years if you wish to continue using this type of birth  control. What may interact with this medicine? Do not take this medicine with any of the following medications: -amprenavir -bosentan -fosamprenavir This medicine may also interact with the following medications: -aprepitant -armodafinil -barbiturate medicines for inducing sleep or treating seizures -bexarotene -boceprevir -griseofulvin -medicines to treat seizures like carbamazepine, ethotoin, felbamate, oxcarbazepine, phenytoin, topiramate -modafinil -pioglitazone -rifabutin -rifampin -rifapentine -some medicines to treat HIV infection like atazanavir, efavirenz, indinavir, lopinavir, nelfinavir, tipranavir, ritonavir -St. John's wort -warfarin This list may not describe all possible interactions. Give your health care provider a list of all the medicines, herbs, non-prescription drugs, or dietary supplements you use. Also tell them if you smoke, drink alcohol, or use illegal drugs. Some items may interact with your medicine. What should I watch for while using this medicine? Visit your doctor or health care professional for regular check ups. See your doctor if you or your partner has sexual contact with others, becomes HIV positive, or gets a sexual transmitted disease. This product does not protect you against HIV infection (AIDS) or other sexually transmitted diseases. You can check the placement of the IUD yourself by reaching up to the top of your vagina with clean fingers to feel the threads. Do not pull on the threads. It is a good habit to check placement after each menstrual period. Call your doctor right away if you feel more of the IUD than just the threads or if you cannot feel the threads at all. The IUD may come out by itself. You may become pregnant if the device comes out. If you notice that the IUD has come out use a backup birth control method like condoms and call your   health care provider. Using tampons will not change the position of the IUD and are okay to use  during your period. This IUD can be safely scanned with magnetic resonance imaging (MRI) only under specific conditions. Before you have an MRI, tell your healthcare provider that you have an IUD in place, and which type of IUD you have in place. What side effects may I notice from receiving this medicine? Side effects that you should report to your doctor or health care professional as soon as possible: -allergic reactions like skin rash, itching or hives, swelling of the face, lips, or tongue -fever, flu-like symptoms -genital sores -high blood pressure -no menstrual period for 6 weeks during use -pain, swelling, warmth in the leg -pelvic pain or tenderness -severe or sudden headache -signs of pregnancy -stomach cramping -sudden shortness of breath -trouble with balance, talking, or walking -unusual vaginal bleeding, discharge -yellowing of the eyes or skin Side effects that usually do not require medical attention (report to your doctor or health care professional if they continue or are bothersome): -acne -breast pain -change in sex drive or performance -changes in weight -cramping, dizziness, or faintness while the device is being inserted -headache -irregular menstrual bleeding within first 3 to 6 months of use -nausea This list may not describe all possible side effects. Call your doctor for medical advice about side effects. You may report side effects to FDA at 1-800-FDA-1088. Where should I keep my medicine? This does not apply. NOTE: This sheet is a summary. It may not cover all possible information. If you have questions about this medicine, talk to your doctor, pharmacist, or health care provider.  2018 Elsevier/Gold Standard (2016-05-24 14:14:56) Ovarian Cyst An ovarian cyst is a fluid-filled sac on an ovary. The ovaries are organs that make eggs in women. Most ovarian cysts go away on their own and are not cancerous (are benign). Some cysts need treatment. Follow  these instructions at home:  Take over-the-counter and prescription medicines only as told by your doctor.  Do not drive or use heavy machinery while taking prescription pain medicine.  Get pelvic exams and Pap tests as often as told by your doctor.  Return to your normal activities as told by your doctor. Ask your doctor what activities are safe for you.  Do not use any products that contain nicotine or tobacco, such as cigarettes and e-cigarettes. If you need help quitting, ask your doctor.  Keep all follow-up visits as told by your doctor. This is important. Contact a doctor if:  Your periods are: ? Late. ? Irregular. ? Painful.  Your periods stop.  You have pelvic pain that does not go away.  You have pressure on your bladder.  You have trouble making your bladder empty when you pee (urinate).  You have pain during sex.  You have any of the following in your belly (abdomen): ? A feeling of fullness. ? Pressure. ? Discomfort. ? Pain that does not go away. ? Swelling.  You feel sick most of the time.  You have trouble pooping (have constipation).  You are not as hungry as usual (you lose your appetite).  You get very bad acne.  You start to have more hair on your body and face.  You are gaining weight or losing weight without changing your exercise and eating habits.  You think you may be pregnant. Get help right away if:  You have belly pain that is very bad or gets worse.  You cannot eat  or drink without throwing up (vomiting).  You suddenly get a fever.  Your period is a lot heavier than usual. This information is not intended to replace advice given to you by your health care provider. Make sure you discuss any questions you have with your health care provider. Document Released: 01/29/2008 Document Revised: 03/01/2016 Document Reviewed: 01/14/2016 Elsevier Interactive Patient Education  [employer].

## 2018-01-08 LAB — C. TRACHOMATIS/N. GONORRHOEAE RNA
C. TRACHOMATIS RNA, TMA: NOT DETECTED
N. GONORRHOEAE RNA, TMA: NOT DETECTED

## 2018-02-13 ENCOUNTER — Other Ambulatory Visit: Payer: Self-pay | Admitting: Internal Medicine

## 2018-02-13 DIAGNOSIS — Z1231 Encounter for screening mammogram for malignant neoplasm of breast: Secondary | ICD-10-CM

## 2018-03-10 ENCOUNTER — Ambulatory Visit
Admission: RE | Admit: 2018-03-10 | Discharge: 2018-03-10 | Disposition: A | Discharge status: Home / Self Care | Payer: PRIVATE HEALTH INSURANCE | Source: Ambulatory Visit | Attending: Internal Medicine | Admitting: Internal Medicine

## 2018-03-10 DIAGNOSIS — Z1231 Encounter for screening mammogram for malignant neoplasm of breast: Secondary | ICD-10-CM

## 2018-04-02 ENCOUNTER — Telehealth: Payer: Self-pay

## 2018-04-02 NOTE — Telephone Encounter (Signed)
Patient called asking if I would check insurance benefits for her for tubal ligation and let her know how much it will cost her to have this surgery.  I spoke with Horris Latino today and she looked over patient's policy. She said it falls under preventative and women's services and is covered at 100%. No prior Josem Kaufmann is required since outpatient.  I called patient and per DPR access note on file I left detailed message in her voice mail informing her of this.

## 2018-05-15 ENCOUNTER — Encounter: Payer: PRIVATE HEALTH INSURANCE | Admitting: Gynecology

## 2018-05-19 ENCOUNTER — Encounter: Payer: PRIVATE HEALTH INSURANCE | Admitting: Gynecology

## 2018-07-07 ENCOUNTER — Ambulatory Visit: Payer: Self-pay | Admitting: Nurse Practitioner

## 2018-07-07 ENCOUNTER — Other Ambulatory Visit: Payer: Self-pay | Admitting: Nurse Practitioner

## 2018-07-07 VITALS — BP 110/68 | HR 91 | Temp 97.9°F | Resp 16 | Ht 62.5 in | Wt 172.4 lb

## 2018-07-07 DIAGNOSIS — J3089 Other allergic rhinitis: Secondary | ICD-10-CM

## 2018-07-07 DIAGNOSIS — H6690 Otitis media, unspecified, unspecified ear: Secondary | ICD-10-CM | POA: Insufficient documentation

## 2018-07-07 DIAGNOSIS — R7309 Other abnormal glucose: Secondary | ICD-10-CM

## 2018-07-07 DIAGNOSIS — H6593 Unspecified nonsuppurative otitis media, bilateral: Secondary | ICD-10-CM

## 2018-07-07 MED ORDER — FEXOFENADINE HCL 180 MG PO TABS: 180.0000 mg | ORAL_TABLET | Freq: Every day | ORAL | 1 refills | Status: DC

## 2018-07-07 MED ORDER — FLUTICASONE PROPIONATE 50 MCG/ACT NA SUSP: NASAL | 1 refills | Status: DC

## 2018-07-07 NOTE — Patient Instructions (Signed)
Take medication as as directed   Discussed that ear itching is a symptom of fluid in ears/allergies and meds prescribed today should assist  Monitor for signs/symptoms of ear infection Watch carb intake and will call to discuss labs once they return If no improvement in symptoms over the next week; return to clinic for follow

## 2018-07-07 NOTE — Progress Notes (Signed)
   Subjective:    Patient ID: Lori Cameron, female    DOB: 05-27-1976, 42 y.o.   MRN: [id number]  HPI Lori Cameron [facility] today requesting an a1c and to have her ears checked. She admits she has had ongoing itching of the ears in which she has seen ENT in the past. Also reports she has impaired glucose and wants to have her a1c checked. She denies any ear pain, fever or other upper respiratory symptoms. She denies any numbness in feet.    Review of Systems  HENT: Negative for congestion, drooling, ear discharge, ear pain, hearing loss, postnasal drip, rhinorrhea, sinus pressure, sneezing and sore throat.        Bilateral ear itching  Respiratory: Negative for shortness of breath.   Cardiovascular: Negative for chest pain.  Skin: Negative for color change.       Objective:   Physical Exam  Constitutional: She is oriented to person, place, and time. She appears well-developed and well-nourished. No distress.  HENT:  Head: Normocephalic and atraumatic.  Right Ear: External ear normal.  Left Ear: External ear normal.  No frontal or maxillary sinus tenderness on palpation Bilateral EACs clear with clear serous fluid bilateral No erythema or cobblestone to pharnyx Bilateral turbinate with erythema and edema bilaterally with yellow mucus on turbinates and boggyness  Eyes: Conjunctivae are normal. Left eye exhibits no discharge.  Hyperpigmented areas to lower lids bilaterally  Neck: Normal range of motion. Neck supple. No tracheal deviation present.  Cardiovascular: Normal rate, regular rhythm and normal heart sounds.  Pulmonary/Chest: Effort normal and breath sounds normal. No respiratory distress.  Abdominal: Soft. Bowel sounds are normal.  Musculoskeletal: Normal range of motion.  Lymphadenopathy:    She has cervical adenopathy.  Neurological: She is alert and oriented to person, place, and time.  Skin: Skin is warm and dry.  Psychiatric: She has a  normal mood and affect.  Vitals reviewed.         Assessment & Plan:

## 2018-07-10 LAB — COMPLETE METABOLIC PANEL WITH GFR
AG RATIO: 1.6 (calc) (ref 1.0–2.5)
ALBUMIN MSPROF: 4.2 g/dL (ref 3.6–5.1)
ALT: 27 U/L (ref 6–29)
AST: 19 U/L (ref 10–30)
Alkaline phosphatase (APISO): 70 U/L (ref 33–115)
BUN: 15 mg/dL (ref 7–25)
CO2: 23 mmol/L (ref 20–32)
CREATININE: 0.9 mg/dL (ref 0.50–1.10)
Calcium: 9.1 mg/dL (ref 8.6–10.2)
Chloride: 101 mmol/L (ref 98–110)
GFR, EST AFRICAN AMERICAN: 92 mL/min/{1.73_m2} (ref >=60)
GFR, EST NON AFRICAN AMERICAN: 79 mL/min/{1.73_m2} (ref >=60)
GLOBULIN: 2.7 g/dL (ref 1.9–3.7)
Glucose, Bld: 163 mg/dL — ABNORMAL HIGH (ref 65–139)
POTASSIUM: 3.6 mmol/L (ref 3.5–5.3)
SODIUM: 135 mmol/L (ref 135–146)
Total Bilirubin: 0.5 mg/dL (ref 0.2–1.2)
Total Protein: 6.9 g/dL (ref 6.1–8.1)

## 2018-07-10 LAB — CBC WITH DIFFERENTIAL/PLATELET
BASOS ABS: 50 {cells}/uL (ref 0–200)
Basophils Relative: 0.6 %
EOS ABS: 133 {cells}/uL (ref 15–500)
Eosinophils Relative: 1.6 %
HEMATOCRIT: 39.1 % (ref 35.0–45.0)
Hemoglobin: 12.9 g/dL (ref 11.7–15.5)
Lymphs Abs: 1951 cells/uL (ref 850–3900)
MCH: 30.9 pg (ref 27.0–33.0)
MCHC: 33 g/dL (ref 32.0–36.0)
MCV: 93.8 fL (ref 80.0–100.0)
MPV: 10.1 fL (ref 7.5–12.5)
Monocytes Relative: 4.3 %
NEUTROS PCT: 70 %
Neutro Abs: 5810 cells/uL (ref 1500–7800)
PLATELETS: 299 10*3/uL (ref 140–400)
RBC: 4.17 10*6/uL (ref 3.80–5.10)
RDW: 11.8 % (ref 11.0–15.0)
Total Lymphocyte: 23.5 %
WBC: 8.3 10*3/uL (ref 3.8–10.8)
WBCMIX: 357 {cells}/uL (ref 200–950)

## 2018-07-10 LAB — HEMOGLOBIN A1C W/OUT EAG: Hgb A1c MFr Bld: 5.8 % of total Hgb — ABNORMAL HIGH (ref <5.7)

## 2018-08-20 ENCOUNTER — Ambulatory Visit: Payer: Self-pay | Admitting: Nurse Practitioner

## 2018-08-20 VITALS — BP 122/86 | HR 87 | Temp 98.3°F | Resp 16 | Ht 63.0 in | Wt 173.6 lb

## 2018-08-20 DIAGNOSIS — E669 Obesity, unspecified: Secondary | ICD-10-CM

## 2018-08-20 DIAGNOSIS — R7301 Impaired fasting glucose: Secondary | ICD-10-CM

## 2018-08-20 DIAGNOSIS — M5442 Lumbago with sciatica, left side: Secondary | ICD-10-CM

## 2018-08-20 MED ORDER — PREDNISONE 10 MG (48) PO TBPK: ORAL_TABLET | ORAL | 0 refills | Status: DC

## 2018-08-20 MED ORDER — ACETAMINOPHEN 500 MG PO TABS: ORAL_TABLET | ORAL | 0 refills | Status: DC

## 2018-08-20 MED ORDER — IBUPROFEN 800 MG PO TABS: 800.0000 mg | ORAL_TABLET | Freq: Three times a day (TID) | ORAL | 0 refills | Status: DC | PRN

## 2018-08-20 NOTE — Patient Instructions (Addendum)
Lori Cameron please share your labs with your PCP and work on healthy eating habits, cutting back on wine and exercise as tolerated. Copy of labs given today. Don't forget to take your blood pressure medicine today Discussed use of motrin and will give short term supply, but please don't take with prednisone; side effects were discussed today Get over the counter tylenol 500mg  and take 2 tabs by mouth twice daily for one week, then as needed Heat, stretching, and take meds as directed. Return to clinic if symptoms persist or worsen  These were printed and discussed to do low back stretching exercises: Low Back Sprain Rehab Ask your health care provider which exercises are safe for you. Do exercises exactly as told by your health care provider and adjust them as directed. It is normal to feel mild stretching, pulling, tightness, or discomfort as you do these exercises, but you should stop right away if you feel sudden pain or your pain gets worse. Do not begin these exercises until told by your health care provider. Stretching and range of motion exercises These exercises warm up your muscles and joints and improve the movement and flexibility of your back. These exercises also help to relieve pain, numbness, and tingling. Exercise A: Lumbar rotation  1. Lie on your back on a firm surface and bend your knees. 2. Straighten your arms out to your sides so each arm forms an "L" shape with a side of your body (a 90 degree angle). 3. Slowly move both of your knees to one side of your body until you feel a stretch in your lower back. Try not to let your shoulders move off of the floor. 4. Hold for __________ seconds. 5. Tense your abdominal muscles and slowly move your knees back to the starting position. 6. Repeat this exercise on the other side of your body. Repeat __________ times. Complete this exercise __________ times a day. Exercise B: Prone extension on elbows  1. Lie on your abdomen on a firm  surface. 2. Prop yourself up on your elbows. 3. Use your arms to help lift your chest up until you feel a gentle stretch in your abdomen and your lower back. ? This will place some of your body weight on your elbows. If this is uncomfortable, try stacking pillows under your chest. ? Your hips should stay down, against the surface that you are lying on. Keep your hip and back muscles relaxed. 4. Hold for __________ seconds. 5. Slowly relax your upper body and return to the starting position. Repeat __________ times. Complete this exercise __________ times a day. Strengthening exercises These exercises build strength and endurance in your back. Endurance is the ability to use your muscles for a long time, even after they get tired. Exercise C: Pelvic tilt 1. Lie on your back on a firm surface. Bend your knees and keep your feet flat. 2. Tense your abdominal muscles. Tip your pelvis up toward the ceiling and flatten your lower back into the floor. ? To help with this exercise, you may place a small towel under your lower back and try to push your back into the towel. 3. Hold for __________ seconds. 4. Let your muscles relax completely before you repeat this exercise. Repeat __________ times. Complete this exercise __________ times a day. Exercise D: Alternating arm and leg raises  1. Get on your hands and knees on a firm surface. If you are on a hard floor, you may want to use padding to cushion your  knees, such as an exercise mat. 2. Line up your arms and legs. Your hands should be below your shoulders, and your knees should be below your hips. 3. Lift your left leg behind you. At the same time, raise your right arm and straighten it in front of you. ? Do not lift your leg higher than your hip. ? Do not lift your arm higher than your shoulder. ? Keep your abdominal and back muscles tight. ? Keep your hips facing the ground. ? Do not arch your back. ? Keep your balance carefully, and do not  hold your breath. 4. Hold for __________ seconds. 5. Slowly return to the starting position and repeat with your right leg and your left arm. Repeat __________ times. Complete this exercise __________ times a day. Exercise E: Abdominal set with straight leg raise  1. Lie on your back on a firm surface. 2. Bend one of your knees and keep your other leg straight. 3. Tense your abdominal muscles and lift your straight leg up, 4-6 inches (10-15 cm) off the ground. 4. Keep your abdominal muscles tight and hold for __________ seconds. ? Do not hold your breath. ? Do not arch your back. Keep it flat against the ground. 5. Keep your abdominal muscles tense as you slowly lower your leg back to the starting position. 6. Repeat with your other leg. Repeat __________ times. Complete this exercise __________ times a day. Posture and body mechanics  Body mechanics refers to the movements and positions of your body while you do your daily activities. Posture is part of body mechanics. Good posture and healthy body mechanics can help to relieve stress in your body's tissues and joints. Good posture means that your spine is in its natural S-curve position (your spine is neutral), your shoulders are pulled back slightly, and your head is not tipped forward. The following are general guidelines for applying improved posture and body mechanics to your everyday activities. Standing   When standing, keep your spine neutral and your feet about hip-width apart. Keep a slight bend in your knees. Your ears, shoulders, and hips should line up.  When you do a task in which you stand in one place for a long time, place one foot up on a stable object that is 2-4 inches (5-10 cm) high, such as a footstool. This helps keep your spine neutral. Sitting   When sitting, keep your spine neutral and keep your feet flat on the floor. Use a footrest, if necessary, and keep your thighs parallel to the floor. Avoid rounding your  shoulders, and avoid tilting your head forward.  When working at a desk or a computer, keep your desk at a height where your hands are slightly lower than your elbows. Slide your chair under your desk so you are close enough to maintain good posture.  When working at a computer, place your monitor at a height where you are looking straight ahead and you do not have to tilt your head forward or downward to look at the screen. Resting   When lying down and resting, avoid positions that are most painful for you.  If you have pain with activities such as sitting, bending, stooping, or squatting (flexion-based activities), lie in a position in which your body does not bend very much. For example, avoid curling up on your side with your arms and knees near your chest (fetal position).  If you have pain with activities such as standing for a long time or  reaching with your arms (extension-based activities), lie with your spine in a neutral position and bend your knees slightly. Try the following positions:  Lying on your side with a pillow between your knees.  Lying on your back with a pillow under your knees. Lifting   When lifting objects, keep your feet at least shoulder-width apart and tighten your abdominal muscles.  Bend your knees and hips and keep your spine neutral. It is important to lift using the strength of your legs, not your back. Do not lock your knees straight out.  Always ask for help to lift heavy or awkward objects. This information is not intended to replace advice given to you by your health care provider. Make sure you discuss any questions you have with your health care provider. Document Released: 08/12/2005 Document Revised: 04/18/2016 Document Reviewed: 05/24/2015 Elsevier Interactive Patient Education  2019 [employer].

## 2018-08-20 NOTE — Progress Notes (Addendum)
   Subjective:    Patient ID: Lori Cameron, female    DOB: 05-03-1976, 42 y.o.   MRN: [id number]  HPI Courtny comes [facility] today with c/o low back pain that has been ongoing. She reports her pain is 8/10 but she has dealt with this so long that she just "deals with it". She reports she takes motrin 800 mg at times and it helps some. She denies this affecting her ability to do ADL's or work. Discussed if she's retrieved labs and message for review which she has not. A copy of labs and discussion of a1c of 5.8 today. She admits she knows she needs to do something about her weight and plans to make a change. Encouraged to consider cutting back on wine, get back into exercising and watch portion sizes. Discussed a diet like nutrisytem or low calorie/modified portion size diet and low carb. She reports that she did not take blood pressure medicine today. "I just didn't feel like taking it this morning". Agrees to take when she gets off of work.   Review of Systems  Musculoskeletal: Positive for arthralgias and back pain. Negative for gait problem and joint swelling.  Skin: Negative for color change and rash.  Neurological: Negative for weakness and numbness.       Denies tingling       Objective:   Physical Exam Vitals signs reviewed.  Constitutional:      Appearance: She is well-developed. She is obese.  HENT:     Head: Normocephalic.  Neck:     Musculoskeletal: Normal range of motion.  Cardiovascular:     Rate and Rhythm: Normal rate.  Pulmonary:     Effort: Pulmonary effort is normal. No respiratory distress.  Musculoskeletal: Normal range of motion.        General: Tenderness present. No swelling or deformity.     Comments: FROM to back with rotation, hyperextension, forward flexion and lateral rotation. Tenderness noted to left sacral region.   Skin:    General: Skin is warm and dry.  Neurological:     Mental Status: She is alert and  oriented to person, place, and time.     Coordination: Coordination normal.     Gait: Gait normal.  Psychiatric:        Mood and Affect: Mood normal.           Assessment & Plan:

## 2018-11-05 ENCOUNTER — Other Ambulatory Visit: Payer: Self-pay | Admitting: Obstetrics and Gynecology

## 2018-11-05 ENCOUNTER — Other Ambulatory Visit (HOSPITAL_COMMUNITY)
Admission: RE | Admit: 2018-11-05 | Discharge: 2018-11-05 | Disposition: A | Discharge status: Home / Self Care | Payer: PRIVATE HEALTH INSURANCE | Source: Ambulatory Visit | Attending: Obstetrics and Gynecology | Admitting: Obstetrics and Gynecology

## 2018-11-05 DIAGNOSIS — Z01411 Encounter for gynecological examination (general) (routine) with abnormal findings: Secondary | ICD-10-CM | POA: Insufficient documentation

## 2018-11-10 ENCOUNTER — Other Ambulatory Visit: Payer: Self-pay | Admitting: Obstetrics and Gynecology

## 2018-11-10 LAB — CYTOLOGY - PAP
Diagnosis: NEGATIVE
HPV: NOT DETECTED

## 2019-04-27 ENCOUNTER — Ambulatory Visit: Payer: PRIVATE HEALTH INSURANCE | Admitting: Family Medicine

## 2019-04-27 ENCOUNTER — Other Ambulatory Visit: Payer: Self-pay | Admitting: Family Medicine

## 2019-04-27 VITALS — Ht 62.0 in | Wt 178.6 lb

## 2019-04-27 DIAGNOSIS — R7303 Prediabetes: Secondary | ICD-10-CM

## 2019-04-27 NOTE — Progress Notes (Signed)
  Subjective:     Patient ID: Lori Cameron, female   DOB: Apr 10, 1976, 43 y.o.   MRN: [id number]  HPI Lori Cameron presents to [facility] today to discuss her prediabetes and have A1c level drawn. She saw her PCP in February of this year and A1c was at 6.1%. She is working on lifestyle factors to improve this and has been making some changes over the last couple months. Her goal weight is 145 lbs.   Review of Systems See HPI    Objective:   Physical Exam Constitutional:      General: She is not in acute distress.    Appearance: Normal appearance. She is not toxic-appearing.  HENT:     Head: Normocephalic and atraumatic.  Pulmonary:     Effort: Pulmonary effort is normal. No respiratory distress.  Skin:    General: Skin is warm and dry.  Neurological:     Mental Status: She is alert and oriented to person, place, and time.  Psychiatric:        Mood and Affect: Mood normal.        Behavior: Behavior normal.        Thought Content: Thought content normal.        Assessment:     Prediabetes      Plan:     1. Discussed strategies for weight loss and setting realistic goals as well as non-weight-based goals. She is making dietary changes, reducing her carbs and sugar intake. She states she will work on decreasing her wine intake. She also recently bought a bike and helmet and rode 1 mile with her sister. She is working to increase that mileage amount over time. She wants to avoid taking medication for diabetes and is motivated to make lifestyle changes. A1c obtained today, awaiting results. Will notify patient of results. Recommend continued f/u with PCP as well as f/u here in 3 months to review her goals and discuss her lifestyle changes.

## 2019-04-27 NOTE — Progress Notes (Deleted)
Goal weight is 145  PCP saw in Feb/March -

## 2019-04-28 LAB — HEMOGLOBIN A1C W/OUT EAG: Hgb A1c MFr Bld: 6.1 % of total Hgb — ABNORMAL HIGH (ref <5.7)

## 2019-05-18 ENCOUNTER — Encounter: Payer: Self-pay | Admitting: Gynecology

## 2019-08-16 ENCOUNTER — Other Ambulatory Visit: Payer: Self-pay | Admitting: Family Medicine

## 2019-08-16 DIAGNOSIS — J3089 Other allergic rhinitis: Secondary | ICD-10-CM

## 2019-08-16 MED ORDER — FLUTICASONE PROPIONATE 50 MCG/ACT NA SUSP: NASAL | 1 refills | Status: DC

## 2019-09-01 ENCOUNTER — Ambulatory Visit (INDEPENDENT_AMBULATORY_CARE_PROVIDER_SITE_OTHER): Payer: PRIVATE HEALTH INSURANCE

## 2019-09-01 ENCOUNTER — Other Ambulatory Visit: Payer: Self-pay

## 2019-09-01 ENCOUNTER — Ambulatory Visit (INDEPENDENT_AMBULATORY_CARE_PROVIDER_SITE_OTHER): Payer: PRIVATE HEALTH INSURANCE | Admitting: Sports Medicine

## 2019-09-01 DIAGNOSIS — M5416 Radiculopathy, lumbar region: Secondary | ICD-10-CM | POA: Diagnosis not present

## 2019-09-01 DIAGNOSIS — G8929 Other chronic pain: Secondary | ICD-10-CM | POA: Diagnosis not present

## 2019-09-01 DIAGNOSIS — M533 Sacrococcygeal disorders, not elsewhere classified: Secondary | ICD-10-CM | POA: Diagnosis not present

## 2019-09-01 MED ORDER — MELOXICAM 15 MG PO TABS: ORAL_TABLET | ORAL | 3 refills | Status: DC

## 2019-09-01 MED ORDER — PREDNISONE 50 MG PO TABS: ORAL_TABLET | ORAL | 0 refills | Status: DC

## 2019-09-01 NOTE — Progress Notes (Signed)
    Procedures performed today:    None.  Independent interpretation of tests performed by another provider:   None.  Impression and Recommendations:    Left lumbar radiculitis This continues to be resolved after a left-sided L5-S1 interlaminar epidural back in 2016.  Chronic right SI joint pain This pleasant 44 year old female returns, she has right-sided low back pain today, located at the right SI joint, worse with standing and weightbearing, we are going to start conservatively, 5 days of prednisone, meloxicam, x-rays of the lumbar spine and SI joints. SI joint rehabilitation. Return to see me in 4 to 6 weeks, SI joint injection if no better.    ___________________________________________ Gwen Her. Dianah Field, M.D., ABFM., CAQSM. Primary Care and [place] Instructor of [place] [facility]

## 2019-09-01 NOTE — Assessment & Plan Note (Signed)
This continues to be resolved after a left-sided L5-S1 interlaminar epidural back in 2016.

## 2019-09-01 NOTE — Assessment & Plan Note (Addendum)
This pleasant 44 year old female returns, she has right-sided low back pain today, located at the right SI joint, worse with standing and weightbearing, we are going to start conservatively, 5 days of prednisone, meloxicam, x-rays of the lumbar spine and SI joints. SI joint rehabilitation. Return to see me in 4 to 6 weeks, SI joint injection if no better.

## 2019-10-12 ENCOUNTER — Ambulatory Visit: Payer: PRIVATE HEALTH INSURANCE | Admitting: Family Medicine

## 2019-11-09 ENCOUNTER — Ambulatory Visit: Payer: PRIVATE HEALTH INSURANCE | Admitting: Family Medicine

## 2019-11-09 VITALS — BP 124/90 | HR 80

## 2019-11-09 DIAGNOSIS — Z789 Other specified health status: Secondary | ICD-10-CM

## 2019-11-09 NOTE — Progress Notes (Signed)
Subjective:     Patient ID: Lori Cameron, female   DOB: November 11, 1975, 44 y.o.   MRN: [id number]  HPI  [facility] her required wellness visit for her insurance. Her PCP is Dr. Delfina Redwood, who she sees regularly. She is due for her mammogram and pap smear which she will schedule. She reports a recent diagnosis of T2DM and is wanting to make lifestyle changes to help keep this under good control without the use of medications. She is looking into water aerobics and biking as exercise, and wants to work on weight loss. She reports a good support system at home.   Past Medical History:  Diagnosis Date  . ASCUS (atypical squamous cells of undetermined significance) on Pap smear 1/21014   neg HR HPV  . Asthma   . Condyloma acuminatum    history of  . Depression   . Diabetes mellitus without complication ([place])   . 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 Hypertension    Allergies  Allergen Reactions  . Amoxicillin-Pot Clavulanate Diarrhea  . Clindamycin Hcl Diarrhea    Current Outpatient Medications:  .  albuterol (VENTOLIN HFA) 108 (90 Base) MCG/ACT inhaler, Ventolin HFA 90 mcg/actuation aerosol inhaler, Disp: , Rfl:  .  BREO ELLIPTA 100-25 MCG/INH AEPB, Inhale 1 puff into the lungs daily., Disp: , Rfl:  .  citalopram (CELEXA) 10 MG tablet, citalopram 10 mg tablet, Disp: , Rfl:  .  fluticasone (FLONASE) 50 MCG/ACT nasal spray, 1 spray to each nostril twice daily, Disp: 16 g, Rfl: 1 .  losartan-hydrochlorothiazide (HYZAAR) 50-12.5 MG tablet, Take 1 tablet by mouth daily., Disp: , Rfl:  .  meloxicam (MOBIC) 15 MG tablet, One tab PO qAM with a meal for 2 weeks, then daily prn pain., Disp: 30 tablet, Rfl: 3   Review of Systems  Constitutional: Negative for chills, fatigue, fever and unexpected weight change.  HENT: Negative for congestion, ear pain, sinus pressure, sinus  pain and sore throat.   Eyes: Negative for discharge and visual disturbance.  Respiratory: Negative for cough, shortness of breath and wheezing.   Cardiovascular: Negative for chest pain and leg swelling.  Gastrointestinal: Negative for abdominal pain, blood in stool, constipation, diarrhea, nausea and vomiting.  Genitourinary: Negative for difficulty urinating and hematuria.  Skin: Negative for color change.  Neurological: Negative for dizziness, weakness, light-headedness and headaches.  Hematological: Negative for adenopathy.  All other systems reviewed and are negative.      Objective:   Physical Exam Vitals reviewed.  Constitutional:      General: She is not in acute distress.    Appearance: Normal appearance. She is well-developed.  HENT:     Head: Normocephalic and atraumatic.  Eyes:     General:        Right eye: No discharge.        Left eye: No discharge.  Pulmonary:     Effort: Pulmonary effort is normal. No respiratory distress.  Musculoskeletal:     Cervical back: Neck supple.  Skin:    General: Skin is warm and dry.  Neurological:     Mental Status: She is alert and oriented to person, place, and time.  Psychiatric:        Mood and Affect: Mood normal.        Behavior: Behavior normal.    Today's Vitals   11/09/19 1208  BP: 124/90  Pulse: 80  SpO2: 98%   There is no height or weight on file to calculate BMI.     Assessment:     Participant in health and wellness plan      Plan:     1. Lengthy discussion regarding her T2DM diagnosis and her motivation and readiness to make lifestyle changes. Recommend getting referral for diabetes self-management education. Encouraged making small manageable changes that are sustainable in regards to her diet and exercise.  2. Keep all regular appts with PCP 3. F/u here prn.

## 2019-11-12 ENCOUNTER — Other Ambulatory Visit: Payer: Self-pay | Admitting: Obstetrics and Gynecology

## 2019-11-12 DIAGNOSIS — N63 Unspecified lump in unspecified breast: Secondary | ICD-10-CM

## 2019-12-01 ENCOUNTER — Ambulatory Visit
Admission: RE | Admit: 2019-12-01 | Discharge: 2019-12-01 | Disposition: A | Discharge status: Home / Self Care | Payer: PRIVATE HEALTH INSURANCE | Source: Ambulatory Visit | Attending: Obstetrics and Gynecology | Admitting: Obstetrics and Gynecology

## 2019-12-01 ENCOUNTER — Other Ambulatory Visit: Payer: Self-pay

## 2019-12-01 DIAGNOSIS — N63 Unspecified lump in unspecified breast: Secondary | ICD-10-CM

## 2020-02-15 ENCOUNTER — Encounter (HOSPITAL_BASED_OUTPATIENT_CLINIC_OR_DEPARTMENT_OTHER): Payer: Self-pay | Admitting: Obstetrics and Gynecology

## 2020-02-15 ENCOUNTER — Other Ambulatory Visit: Payer: Self-pay

## 2020-02-21 ENCOUNTER — Other Ambulatory Visit (HOSPITAL_COMMUNITY)
Admission: RE | Admit: 2020-02-21 | Discharge: 2020-02-21 | Disposition: A | Discharge status: Home / Self Care | Payer: PRIVATE HEALTH INSURANCE | Source: Ambulatory Visit | Attending: Obstetrics and Gynecology | Admitting: Obstetrics and Gynecology

## 2020-02-21 ENCOUNTER — Encounter (HOSPITAL_BASED_OUTPATIENT_CLINIC_OR_DEPARTMENT_OTHER)
Admission: RE | Admit: 2020-02-21 | Discharge: 2020-02-21 | Disposition: A | Discharge status: Home / Self Care | Payer: PRIVATE HEALTH INSURANCE | Source: Ambulatory Visit | Attending: Obstetrics and Gynecology | Admitting: Obstetrics and Gynecology

## 2020-02-21 DIAGNOSIS — Z881 Allergy status to other antibiotic agents status: Secondary | ICD-10-CM | POA: Diagnosis not present

## 2020-02-21 DIAGNOSIS — Z01812 Encounter for preprocedural laboratory examination: Secondary | ICD-10-CM | POA: Diagnosis present

## 2020-02-21 DIAGNOSIS — Z20822 Contact with and (suspected) exposure to covid-19: Secondary | ICD-10-CM | POA: Diagnosis not present

## 2020-02-21 DIAGNOSIS — J45909 Unspecified asthma, uncomplicated: Secondary | ICD-10-CM | POA: Diagnosis not present

## 2020-02-21 DIAGNOSIS — Z86018 Personal history of other benign neoplasm: Secondary | ICD-10-CM | POA: Diagnosis not present

## 2020-02-21 DIAGNOSIS — N840 Polyp of corpus uteri: Secondary | ICD-10-CM | POA: Diagnosis not present

## 2020-02-21 DIAGNOSIS — F329 Major depressive disorder, single episode, unspecified: Secondary | ICD-10-CM | POA: Diagnosis not present

## 2020-02-21 DIAGNOSIS — I10 Essential (primary) hypertension: Secondary | ICD-10-CM | POA: Diagnosis not present

## 2020-02-21 DIAGNOSIS — Z79899 Other long term (current) drug therapy: Secondary | ICD-10-CM | POA: Diagnosis not present

## 2020-02-21 DIAGNOSIS — D259 Leiomyoma of uterus, unspecified: Secondary | ICD-10-CM | POA: Diagnosis present

## 2020-02-21 DIAGNOSIS — Z87891 Personal history of nicotine dependence: Secondary | ICD-10-CM | POA: Diagnosis not present

## 2020-02-21 DIAGNOSIS — N921 Excessive and frequent menstruation with irregular cycle: Secondary | ICD-10-CM | POA: Diagnosis not present

## 2020-02-21 DIAGNOSIS — E119 Type 2 diabetes mellitus without complications: Secondary | ICD-10-CM | POA: Diagnosis not present

## 2020-02-21 LAB — BASIC METABOLIC PANEL
Anion gap: 8 (ref 5–15)
BUN: 19 mg/dL (ref 6–20)
CO2: 23 mmol/L (ref 22–32)
Calcium: 8.7 mg/dL — ABNORMAL LOW (ref 8.9–10.3)
Chloride: 104 mmol/L (ref 98–111)
Creatinine, Ser: 0.81 mg/dL (ref 0.44–1.00)
GFR calc Af Amer: >60 mL/min (ref >60)
GFR calc non Af Amer: >60 mL/min (ref >60)
Glucose, Bld: 102 mg/dL — ABNORMAL HIGH (ref 70–99)
Potassium: 3.9 mmol/L (ref 3.5–5.1)
Sodium: 135 mmol/L (ref 135–145)

## 2020-02-21 LAB — POCT PREGNANCY, URINE: Preg Test, Ur: NEGATIVE

## 2020-02-21 LAB — SARS CORONAVIRUS 2 (TAT 6-24 HRS): SARS Coronavirus 2: NEGATIVE

## 2020-02-22 NOTE — Anesthesia Preprocedure Evaluation (Addendum)
Anesthesia Evaluation  Patient identified by MRN, date of birth, ID band Patient awake    Reviewed: Allergy & Precautions, NPO status , Patient's Chart, lab work & pertinent test results  History of Anesthesia Complications (+) PONV  Airway Mallampati: II  TM Distance: >3 FB Neck ROM: Full    Dental no notable dental hx. (+) Teeth Intact, Dental Advisory Given   Pulmonary asthma , former smoker,    Pulmonary exam normal breath sounds clear to auscultation       Cardiovascular hypertension, Pt. on medications Normal cardiovascular exam Rhythm:Regular Rate:Normal     Neuro/Psych Depression  Neuromuscular disease    GI/Hepatic negative GI ROS, Neg liver ROS,   Endo/Other  diabetes  Renal/GU negative Renal ROS  negative genitourinary   Musculoskeletal negative musculoskeletal ROS (+)   Abdominal   Peds  Hematology negative hematology ROS (+)   Anesthesia Other Findings   Reproductive/Obstetrics negative OB ROS                            Anesthesia Physical Anesthesia Plan  ASA: III  Anesthesia Plan: General   Post-op Pain Management:    Induction: Intravenous  PONV Risk Score and Plan: Treatment may vary due to age or medical condition, Ondansetron, Dexamethasone and Midazolam  Airway Management Planned: LMA  Additional Equipment:   Intra-op Plan:   Post-operative Plan: Extubation in OR  Informed Consent: I have reviewed the patients History and Physical, chart, labs and discussed the procedure including the risks, benefits and alternatives for the proposed anesthesia with the patient or authorized representative who has indicated his/her understanding and acceptance.     Dental advisory given  Plan Discussed with: CRNA  Anesthesia Plan Comments:        Anesthesia Quick Evaluation

## 2020-02-23 ENCOUNTER — Ambulatory Visit (HOSPITAL_BASED_OUTPATIENT_CLINIC_OR_DEPARTMENT_OTHER)
Admission: RE | Admit: 2020-02-23 | Discharge: 2020-02-23 | Disposition: A | Discharge status: Home / Self Care | Payer: PRIVATE HEALTH INSURANCE | Attending: Obstetrics and Gynecology | Admitting: Obstetrics and Gynecology

## 2020-02-23 ENCOUNTER — Encounter (HOSPITAL_BASED_OUTPATIENT_CLINIC_OR_DEPARTMENT_OTHER): Payer: Self-pay | Admitting: Obstetrics and Gynecology

## 2020-02-23 ENCOUNTER — Other Ambulatory Visit: Payer: Self-pay

## 2020-02-23 ENCOUNTER — Ambulatory Visit (HOSPITAL_BASED_OUTPATIENT_CLINIC_OR_DEPARTMENT_OTHER): Payer: PRIVATE HEALTH INSURANCE | Admitting: Anesthesiology

## 2020-02-23 ENCOUNTER — Encounter (HOSPITAL_BASED_OUTPATIENT_CLINIC_OR_DEPARTMENT_OTHER)
Admission: RE | Disposition: A | Discharge status: Home / Self Care | Payer: Self-pay | Source: Home / Self Care | Attending: Obstetrics and Gynecology

## 2020-02-23 DIAGNOSIS — F329 Major depressive disorder, single episode, unspecified: Secondary | ICD-10-CM | POA: Insufficient documentation

## 2020-02-23 DIAGNOSIS — Z79899 Other long term (current) drug therapy: Secondary | ICD-10-CM | POA: Insufficient documentation

## 2020-02-23 DIAGNOSIS — Z881 Allergy status to other antibiotic agents status: Secondary | ICD-10-CM | POA: Insufficient documentation

## 2020-02-23 DIAGNOSIS — N840 Polyp of corpus uteri: Secondary | ICD-10-CM | POA: Diagnosis not present

## 2020-02-23 DIAGNOSIS — Z87891 Personal history of nicotine dependence: Secondary | ICD-10-CM | POA: Insufficient documentation

## 2020-02-23 DIAGNOSIS — I10 Essential (primary) hypertension: Secondary | ICD-10-CM | POA: Insufficient documentation

## 2020-02-23 DIAGNOSIS — Z86018 Personal history of other benign neoplasm: Secondary | ICD-10-CM | POA: Insufficient documentation

## 2020-02-23 DIAGNOSIS — E119 Type 2 diabetes mellitus without complications: Secondary | ICD-10-CM | POA: Insufficient documentation

## 2020-02-23 DIAGNOSIS — J45909 Unspecified asthma, uncomplicated: Secondary | ICD-10-CM | POA: Insufficient documentation

## 2020-02-23 DIAGNOSIS — N921 Excessive and frequent menstruation with irregular cycle: Secondary | ICD-10-CM | POA: Insufficient documentation

## 2020-02-23 HISTORY — DX: Other specified postprocedural states: R11.2

## 2020-02-23 HISTORY — PX: DILITATION & CURRETTAGE/HYSTROSCOPY WITH HYDROTHERMAL ABLATION: SHX5570

## 2020-02-23 HISTORY — PX: INTRAUTERINE DEVICE (IUD) INSERTION: SHX5877

## 2020-02-23 HISTORY — DX: Other specified postprocedural states: Z98.890

## 2020-02-23 SURGERY — DILATATION & CURETTAGE/HYSTEROSCOPY WITH HYDROTHERMAL ABLATION
Anesthesia: General | Site: Uterus

## 2020-02-23 MED ORDER — MIDAZOLAM HCL 5 MG/5ML IJ SOLN
INTRAMUSCULAR | Status: DC | PRN
  Administered 2020-02-23: 2 mg via INTRAVENOUS

## 2020-02-23 MED ORDER — LIDOCAINE 2% (20 MG/ML) 5 ML SYRINGE
INTRAMUSCULAR | Status: DC | PRN
  Administered 2020-02-23: 100 mg via INTRAVENOUS

## 2020-02-23 MED ORDER — MIDAZOLAM HCL 2 MG/2ML IJ SOLN
INTRAMUSCULAR | Status: AC
  Filled 2020-02-23: qty 2

## 2020-02-23 MED ORDER — DEXMEDETOMIDINE HCL IN NACL 200 MCG/50ML IV SOLN
INTRAVENOUS | Status: DC | PRN
Start: 2020-02-23 — End: 2020-02-23
  Administered 2020-02-23: 8 ug via INTRAVENOUS

## 2020-02-23 MED ORDER — PROPOFOL 10 MG/ML IV BOLUS
INTRAVENOUS | Status: AC
  Filled 2020-02-23: qty 20

## 2020-02-23 MED ORDER — ONDANSETRON HCL 4 MG/2ML IJ SOLN: 4.0000 mg | Freq: Once | INTRAMUSCULAR | Status: DC | PRN

## 2020-02-23 MED ORDER — SODIUM CHLORIDE 0.9 % IR SOLN
Status: DC | PRN
  Administered 2020-02-23: 3000 mL

## 2020-02-23 MED ORDER — OXYCODONE HCL 5 MG/5ML PO SOLN: 5.0000 mg | Freq: Once | ORAL | Status: DC | PRN

## 2020-02-23 MED ORDER — HYDROMORPHONE HCL 1 MG/ML IJ SOLN: 0.2500 mg | INTRAMUSCULAR | Status: DC | PRN

## 2020-02-23 MED ORDER — VASOPRESSIN 20 UNIT/ML IV SOLN
INTRAVENOUS | Status: AC
  Filled 2020-02-23: qty 1

## 2020-02-23 MED ORDER — LIDOCAINE HCL 2 % IJ SOLN
INTRAMUSCULAR | Status: DC | PRN
  Administered 2020-02-23: 10 mL

## 2020-02-23 MED ORDER — LACTATED RINGERS IV SOLN: INTRAVENOUS | Status: DC

## 2020-02-23 MED ORDER — ONDANSETRON HCL 4 MG/2ML IJ SOLN
INTRAMUSCULAR | Status: DC | PRN
  Administered 2020-02-23: 4 mg via INTRAVENOUS

## 2020-02-23 MED ORDER — SILVER NITRATE-POT NITRATE 75-25 % EX MISC
CUTANEOUS | Status: AC
  Filled 2020-02-23: qty 10

## 2020-02-23 MED ORDER — DEXAMETHASONE SODIUM PHOSPHATE 10 MG/ML IJ SOLN
INTRAMUSCULAR | Status: DC | PRN
  Administered 2020-02-23: 4 mg via INTRAVENOUS

## 2020-02-23 MED ORDER — FENTANYL CITRATE (PF) 250 MCG/5ML IJ SOLN
INTRAMUSCULAR | Status: DC | PRN
  Administered 2020-02-23 (×2): 25 ug via INTRAVENOUS
  Administered 2020-02-23: 50 ug via INTRAVENOUS

## 2020-02-23 MED ORDER — IBUPROFEN 600 MG PO TABS: 600.0000 mg | ORAL_TABLET | Freq: Four times a day (QID) | ORAL | 0 refills | Status: DC | PRN

## 2020-02-23 MED ORDER — LEVONORGESTREL 20 MCG/24HR IU IUD
INTRAUTERINE_SYSTEM | INTRAUTERINE | Status: DC
  Filled 2020-02-23: qty 1

## 2020-02-23 MED ORDER — PROPOFOL 10 MG/ML IV BOLUS
INTRAVENOUS | Status: DC | PRN
  Administered 2020-02-23: 110 mg via INTRAVENOUS

## 2020-02-23 MED ORDER — LIDOCAINE HCL 2 % IJ SOLN
INTRAMUSCULAR | Status: AC
  Filled 2020-02-23: qty 20

## 2020-02-23 MED ORDER — SODIUM CHLORIDE (PF) 0.9 % IJ SOLN
INTRAMUSCULAR | Status: AC
  Filled 2020-02-23: qty 50

## 2020-02-23 MED ORDER — OXYCODONE HCL 5 MG PO TABS: 5.0000 mg | ORAL_TABLET | Freq: Once | ORAL | Status: DC | PRN

## 2020-02-23 MED ORDER — FENTANYL CITRATE (PF) 100 MCG/2ML IJ SOLN
INTRAMUSCULAR | Status: AC
  Filled 2020-02-23: qty 2

## 2020-02-23 MED ORDER — EPHEDRINE SULFATE-NACL 50-0.9 MG/10ML-% IV SOSY
PREFILLED_SYRINGE | INTRAVENOUS | Status: DC | PRN
  Administered 2020-02-23: 10 mg via INTRAVENOUS

## 2020-02-23 MED ORDER — POVIDONE-IODINE 10 % EX SWAB
2.0000 "application " | Freq: Once | CUTANEOUS | Status: AC
  Administered 2020-02-23: 2 via TOPICAL

## 2020-02-23 MED ORDER — KETOROLAC TROMETHAMINE 30 MG/ML IJ SOLN: 30.0000 mg | Freq: Once | INTRAMUSCULAR | Status: DC | PRN

## 2020-02-23 SURGICAL SUPPLY — 17 items
BRIEF STRETCH FOR OB PAD XXL (UNDERPADS AND DIAPERS) ×3 IMPLANT
CATH ROBINSON RED A/P 16FR (CATHETERS) ×3 IMPLANT
DILATOR CANAL MILEX (MISCELLANEOUS) ×3 IMPLANT
GLOVE BIO SURGEON STRL SZ7 (GLOVE) ×3 IMPLANT
GLOVE BIOGEL PI IND STRL 7.0 (GLOVE) ×2 IMPLANT
GLOVE BIOGEL PI INDICATOR 7.0 (GLOVE) ×4
GOWN STRL REUS W/TWL LRG LVL3 (GOWN DISPOSABLE) ×6 IMPLANT
KIT PROCEDURE FLUENT (KITS) ×1 IMPLANT
Mirena ×2 IMPLANT
NDL SPNL 18GX3.5 QUINCKE PK (NEEDLE) IMPLANT
NEEDLE SPNL 18GX3.5 QUINCKE PK (NEEDLE) ×3 IMPLANT
PACK VAGINAL MINOR WOMEN LF (CUSTOM PROCEDURE TRAY) ×3 IMPLANT
PAD OB MATERNITY 4.3X12.25 (PERSONAL CARE ITEMS) ×3 IMPLANT
PAD PREP 24X48 CUFFED NSTRL (MISCELLANEOUS) ×3 IMPLANT
SET GENESYS HTA PROCERVA (MISCELLANEOUS) ×2 IMPLANT
SLEEVE SCD COMPRESS KNEE MED (MISCELLANEOUS) ×3 IMPLANT
TOWEL GREEN STERILE FF (TOWEL DISPOSABLE) ×6 IMPLANT

## 2020-02-23 NOTE — Anesthesia Procedure Notes (Signed)
Procedure Name: LMA Insertion Date/Time: 02/23/2020 12:53 PM Performed by: Myna Bright, CRNA Pre-anesthesia Checklist: Patient identified, Emergency Drugs available, Suction available and Patient being monitored Patient Re-evaluated:Patient Re-evaluated prior to induction Oxygen Delivery Method: Circle system utilized Preoxygenation: Pre-oxygenation with 100% oxygen Induction Type: IV induction Ventilation: Mask ventilation without difficulty LMA: LMA inserted LMA Size: 4.0 Tube type: Oral Number of attempts: 1 Placement Confirmation: positive ETCO2 and breath sounds checked- equal and bilateral Tube secured with: Tape Dental Injury: Teeth and Oropharynx as per pre-operative assessment

## 2020-02-23 NOTE — Interval H&P Note (Signed)
History and Physical Interval Note:  02/23/2020 12:36 PM  Lori Cameron  has presented today for surgery, with the diagnosis of D21.9-Fibroids N92.1-Menorrhagia with irregular cycle.  The various methods of treatment have been discussed with the patient and family. After consideration of risks, benefits and other options for treatment, the patient has consented to  Procedure(s): DILATATION & CURETTAGE/HYSTEROSCOPY WITH HYDROTHERMAL ABLATION AND POSSIBLE MYOSURE (N/A) INTRAUTERINE DEVICE (IUD) [place] (N/A) as a surgical intervention.  The patient's history has been reviewed, patient examined, no change in status, stable for surgery.  I have reviewed the patient's chart and labs.  Questions were answered to the patient's satisfaction.     Thurnell Lose

## 2020-02-23 NOTE — Op Note (Signed)
NAME: Lori Cameron, Lori Cameron MEDICAL RECORD [id number] ACCOUNT [id number] DATE OF BIRTH:09/23/1975 FACILITY: [facility] LOCATION: [facility] PHYSICIAN:Mackay Hanauer Al Decant, MD  OPERATIVE REPORT  DATE OF PROCEDURE:  02/23/2020  PREOPERATIVE DIAGNOSES:  Fibroids and fibroids.  POSTOPERATIVE DIAGNOSES:  Fibroids and fibroids.  PROCEDURE:   1.  Hysteroscopy.   2.  Dilatation and curettage. 3.  Hydro ThermAblator endometrial ablation.  4.  Insertion of Mirena intrauterine device.  SURGEON:  Thurnell Lose, MD  ASSISTANT:  None.  ANESTHESIA:  Local and general.  ESTIMATED BLOOD LOSS:  5 mL.  BLOOD ADMINISTERED:  None.  DRAINS:  None.  LOCAL:  Lidocaine 2%, 10 mL.  SPECIMENS:  Endometrial curettings.  DISPOSITION OF SPECIMEN:  To pathology.  PATIENT DISPOSITION:  To PACU, hemodynamically stable.  COMPLICATIONS:  None.  DEVICE:  For the IUD, lot number [id number], expiration date 12/13/2021.  FINDINGS:  Normal appearing endometrial cavity.  No obvious polyps or submucosal fibroids.  Cervix appeared normal as well.  Vaginal wall laxity noted.  DESCRIPTION OF PROCEDURE:  The patient was taken to the operating room with IV running.  She underwent general anesthesia without complication.  She was then placed in the dorsal lithotomy position and prepped and draped in a normal sterile fashion.   SCDs were on her legs and operating.  A timeout was performed.  No antibiotics were given prior to the procedure.  A Graves speculum was inserted into the vagina, but due to the depth and the vaginal wall laxity, we needed a longer Graves.  I did use a Deaver and a short weighted speculum unsuccessfully, but when a longer Graves was available, it was appropriate.   Paracervical block was performed with lidocaine.  Single-tooth tenaculum was applied at the anterior lip of the cervix.  The cervix was dilated up to a 14 Pratt.  The hysteroscope was then advanced.  The HTA hysteroscope was advanced and  the findings  above were noted.  The ablation was then activated and the ablation proceeded without complication.  The patient appeared to have an adequate burn at 2 minutes.  At the end of the procedure, additional hysteroscopy was performed.  No fibroids had  herniated through after the ablation and the entire endometrial cavity appeared to be adequately treated.  The hysteroscope was then removed.  Gentle curettage was performed of the 4 quadrants.  The uterus was then sounded to 8.5 cm.  Then a Mirena IUD was inserted without difficulty in the usual fashion.  Strings were trimmed.  Tenaculum was removed.  Tenaculum site was hemostatic.  There was no active bleeding.  Graves was removed.  The patient tolerated the procedure well.  All instrument and sponge counts were correct x3.  VN/NUANCE  D:02/23/2020 T:02/23/2020 JOB:[id number]

## 2020-02-23 NOTE — Transfer of Care (Signed)
Immediate Anesthesia Transfer of Care Note  Patient: Lori Cameron  Procedure(s) Performed: DILATATION & CURETTAGE/HYSTEROSCOPY WITH HYDROTHERMAL ABLATION (N/A Uterus) INTRAUTERINE DEVICE (IUD) INSERTION MARENA (N/A Uterus)  Patient Location: PACU  Anesthesia Type:General  Level of Consciousness: awake, alert , oriented and patient cooperative  Airway & Oxygen Therapy: Patient Spontanous Breathing and Patient connected to nasal cannula oxygen  Post-op Assessment: Report given to RN, Post -op Vital signs reviewed and stable and Patient moving all extremities  Post vital signs: Reviewed and stable  Last Vitals:  Vitals Value Taken Time  BP 127/88 02/23/20 1400  Temp    Pulse 91 02/23/20 1403  Resp 20 02/23/20 1404  SpO2 97 % 02/23/20 1403  Vitals shown include unvalidated device data.  Last Pain:  Vitals:   02/23/20 1132  TempSrc: Oral  PainSc: 0-No pain         Complications: No complications documented.

## 2020-02-23 NOTE — Anesthesia Postprocedure Evaluation (Signed)
Anesthesia Post Note  Patient: Lori Cameron  Procedure(s) Performed: DILATATION & CURETTAGE/HYSTEROSCOPY WITH HYDROTHERMAL ABLATION (N/A Uterus) INTRAUTERINE DEVICE (IUD) INSERTION MARENA (N/A Uterus)     Patient location during evaluation: PACU Anesthesia Type: General Level of consciousness: awake and alert Pain management: pain level controlled Vital Signs Assessment: post-procedure vital signs reviewed and stable Respiratory status: spontaneous breathing, nonlabored ventilation, respiratory function stable and patient connected to nasal cannula oxygen Cardiovascular status: blood pressure returned to baseline and stable Postop Assessment: no apparent nausea or vomiting Anesthetic complications: no   No complications documented.  Last Vitals:  Vitals:   02/23/20 1430 02/23/20 1441  BP: 132/90 127/87  Pulse: 80 77  Resp: 19 20  Temp:    SpO2: 98% 100%    Last Pain:  Vitals:   02/23/20 1441  TempSrc:   PainSc: 0-No pain   Pain Goal:                   Barnet Glasgow

## 2020-02-23 NOTE — Brief Op Note (Signed)
02/23/2020  2:21 PM  PATIENT:  Lori Cameron  44 y.o. female  PRE-OPERATIVE DIAGNOSIS:  D21.9-Fibroids N92.1-Menorrhagia with irregular cycle  POST-OPERATIVE DIAGNOSIS:  D21.9-Fibroids N92.1-Menorrhagia with irregular cycle  PROCEDURE:  Procedure(s): DILATATION & CURETTAGE/HYSTEROSCOPY WITH HYDROTHERMAL ABLATION (N/A) INTRAUTERINE DEVICE (IUD) INSERTION MARENA (N/A)  SURGEON:  Surgeon(s) and Role:    Thurnell Lose, MD - Primary  PHYSICIAN ASSISTANT:   ASSISTANTS: None   ANESTHESIA:   local and general  EBL:  5 ml   BLOOD ADMINISTERED:none  DRAINS: none   LOCAL MEDICATIONS USED:  2% LIDOCAINE  and Amount: 10 ml  SPECIMEN:  Source of Specimen:  endometrial currettings  DISPOSITION OF SPECIMEN:  PATHOLOGY  COUNTS:  YES  TOURNIQUET:  * No tourniquets in log *  DICTATION: .Other Dictation: Dictation Number Dictated @ 1434, 02/23/20.  Number not retrived.  PLAN OF CARE: Discharge to home after PACU  PATIENT DISPOSITION:  PACU - hemodynamically stable.   Delay start of Pharmacological VTE agent (>24hrs) due to surgical blood loss or risk of bleeding: not applicable

## 2020-02-23 NOTE — Discharge Instructions (Signed)
Post Anesthesia Home Care Instructions  Activity: Get plenty of rest for the remainder of the day. A responsible individual must stay with you for 24 hours following the procedure.  For the next 24 hours, DO NOT: -Drive a car -Paediatric nurse -Drink alcoholic beverages -Take any medication unless instructed by your physician -Make any legal decisions or sign important papers.  Meals: Start with liquid foods such as gelatin or soup. Progress to regular foods as tolerated. Avoid greasy, spicy, heavy foods. If nausea and/or vomiting occur, drink only clear liquids until the nausea and/or vomiting subsides. Call your physician if vomiting continues.  Special Instructions/Symptoms: Your throat may feel dry or sore from the anesthesia or the breathing tube placed in your throat during surgery. If this causes discomfort, gargle with warm salt water. The discomfort should disappear within 24 hours.   Endometrial Ablation Endometrial ablation is a procedure that destroys the thin inner layer of the lining of the uterus (endometrium). This procedure may be done:  To stop heavy periods.  To stop bleeding that is causing anemia.  To control irregular bleeding.  To treat bleeding caused by small tumors (fibroids) in the endometrium. This procedure is often an alternative to major surgery, such as removal of the uterus and cervix (hysterectomy). As a result of this procedure:  You may not be able to have children. However, if you are premenopausal (you have not gone through menopause): ? You may still have a small chance of getting pregnant. ? You will need to use a reliable method of birth control after the procedure to prevent pregnancy.  You may stop having a menstrual period, or you may have only a small amount of bleeding during your period. Menstruation may return several years after the procedure. Tell a health care provider about:  Any allergies you have.  All medicines you are  taking, including vitamins, herbs, eye drops, creams, and over-the-counter medicines.  Any problems you or family members have had with the use of anesthetic medicines.  Any blood disorders you have.  Any surgeries you have had.  Any medical conditions you have. What are the risks? Generally, this is a safe procedure. However, problems may occur, including:  A hole (perforation) in the uterus or bowel.  Infection of the uterus, bladder, or vagina.  Bleeding.  Damage to other structures or organs.  An air bubble in the lung (air embolus).  Problems with pregnancy after the procedure.  Failure of the procedure.  Decreased ability to diagnose cancer in the endometrium. What happens before the procedure?  You will have tests of your endometrium to make sure there are no pre-cancerous cells or cancer cells present.  You may have an ultrasound of the uterus.  You may be given medicines to thin the endometrium.  Ask your health care provider about: ? Changing or stopping your regular medicines. This is especially important if you take diabetes medicines or blood thinners. ? Taking medicines such as aspirin and ibuprofen. These medicines can thin your blood. Do not take these medicines before your procedure if your doctor tells you not to.  Plan to have someone take you home from the hospital or clinic. What happens during the procedure?   You will lie on an exam table with your feet and legs supported as in a pelvic exam.  To lower your risk of infection: ? Your health care team will wash or sanitize their hands and put on germ-free (sterile) gloves. ? Your genital area will  be washed with soap.  An IV tube will be inserted into one of your veins.  You will be given a medicine to help you relax (sedative).  A surgical instrument with a light and camera (resectoscope) will be inserted into your vagina and moved into your uterus. This allows your surgeon to see inside your  uterus.  Endometrial tissue will be removed using one of the following methods: ? Radiofrequency. This method uses a radiofrequency-alternating electric current to remove the endometrium. ? Cryotherapy. This method uses extreme cold to freeze the endometrium. ? Heated-free liquid. This method uses a heated saltwater (saline) solution to remove the endometrium. ? Microwave. This method uses high-energy microwaves to heat up the endometrium and remove it. ? Thermal balloon. This method involves inserting a catheter with a balloon tip into the uterus. The balloon tip is filled with heated fluid to remove the endometrium. The procedure may vary among health care providers and hospitals. What happens after the procedure?  Your blood pressure, heart rate, breathing rate, and blood oxygen level will be monitored until the medicines you were given have worn off.  As tissue healing occurs, you may notice vaginal bleeding for 4-6 weeks after the procedure. You may also experience: ? Cramps. ? Thin, watery vaginal discharge that is light pink or brown in color. ? A need to urinate more frequently than usual. ? Nausea.  Do not drive for 24 hours if you were given a sedative.  Do not have sex or insert anything into your vagina until your health care provider approves. Summary  Endometrial ablation is done to treat the many causes of heavy menstrual bleeding.  The procedure may be done only after medications have been tried to control the bleeding.  Plan to have someone take you home from the hospital or clinic. This information is not intended to replace advice given to you by your health care provider. Make sure you discuss any questions you have with your health care provider. Document Revised: 01/27/2018 Document Reviewed: 08/29/2016 Elsevier Patient Education  Lori Cameron.

## 2020-02-23 NOTE — H&P (Signed)
History of Present Illness  General:          44 y/o female presents today for preoperative examination prior to hysteroscopy/D&C w/ HTA (hydrothermal ablation) to be performed on 02/23/2020. This is being performed due to history of uterine fibroids.        The pt's last A1C was noted to be 6.5, which is within acceptable range for surgery.        The pt's BP has been under good control on Losartan, and she has a BP of 112/84 in the office today.        The pt reports that she has just started menses, and that she tends to have a strange odor to her urine when menses is about to start. The pt does not report dysuria or a change in color of her urine at this time. Isolation Precautions:         Respiratory Illness Screening            1. Is fever present / reported? No          2. Are respiratory illness symptom(s) present / reported? No          3. Are other symptom(s) present / reported? No          5. Has there been reported travel to a High Risk respiratory illness region? Unknown          6. Has close* contact with person(s) known to have communicable illness been reported? No       Has patient been tested for COVID-19? No.        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      Current Medications  Taking  CeleXA(Citalopram Hydrobromide) 10 MG Tablet 1 tablet Orally Once a day    Losartan Potassium-HCTZ 50-12.5 MG Tablet 1 tablet Orally Once a day    Breo Ellipta(Fluticasone Furoate-Vilanterol) 100-25 MCG/INH Aerosol Powder Breath Activated 1 puff Inhalation Once a day    Albuterol Sulfate HFA 108 (90 Base) MCG/ACT Aerosol Solution 2 puffs as needed Inhalation every 6 hrs    Flonase(Fluticasone Propionate) 50 MCG/ACT Suspension 1 spray in each nostril Nasally twice a day    OTC Sucontral D 2 capsules Orally Once a day    Medication List reviewed and reconciled with the patient       Past Medical History        Asthma.         Per patient  peptic ulcer, never had any bleeding.         HPV s/p LEEP 2011.         depression stable off meds 05/2012, prior on meds for 6 months with good results. MEDS RESUMED 05/2014.         Hypertension.         Impaired fasting fasting glucose.         Dyspnea 09/28/2019, ekg,ddimer ok, resolved with prednisone taper.         DM Type 2.              Surgical History        laparotomy (vertical incisiont) secondary to adhesions and ovarian cyst, appendectomy Age 58        tonsillectomy         right anterior shin mole removed         LEEP 2010       Family History   Father: alive, healthy  Mother: alive, cervical cancer, diabetes mellitus, hypertension   Brother 1: alive, hypertension   Brother2: alive   Sister 1: alive   Sister 2: alive   2 brother(s) , 2 sister(s) .        Social History  General:        Tobacco use             cigarettes: Former smoker           Quit in year 2021           Tobacco history last updated 02/11/2020           Vaping No      Alcohol: yes, wine.       no Recreational drug use.       Marital Status: single.       EDUCATION: [employer].       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      Gyn History  Sexual activity currently sexually active.   Periods : irregular, heavy blood loss.   LMP 02/11/2020.   Birth control none.   Last pap smear date 11/05/2018-Neg/HPV neg.   Last mammogram date 12/01/2019.   Abnormal pap smear Pap 2014, ASCUS/ HPV neg, LEEP 2010, LGSIL, neg margins, Pap/HPV 2010, HGSIL.   STD Chlamydia or , Gonorrhea, HPV.   Menarche 22.       OB History  abortion 67.       Allergies   Azithromycin: diarrhea       Hospitalization/Major Diagnostic Procedure   surgery        Review of Systems  See scanned ROS form for details.     Vital Signs  Wt 168.7, Wt change -1.3 lb, Ht 62.5, BMI 30.36, Temp 97.4, Pulse sitting 86, BP sitting 112/84.     Physical Examination  Chaperone  present:         Chaperone present  Whitfield,Dia 02/11/2020 11:33:23 AM > , for pelvic exam.   GENERAL:         Patient appears  in NAD, pleasant.         Build:  well developed.         General Appearance:  well-appearing.   LUNGS:         Breath sounds:  clear to auscultation, bilaterally.   HEART:         Heart sounds:  RRR, no murmurs, gallops or rubs.   ABDOMEN:         General:  no masses,tenderness,organomegaly, , BS normal, non distended.   FEMALE GENITOURINARY:         Adnexa:  no mass, non tender.         Anus/perineum:  normal, no lesions.         Cervix/ cuff:  normal appearance , no lesions/discharge/bleeding, good pelvic support .         External genitalia:  normal, no lesions, no skin discoloration.         Rectum:  deferred.         Urethra:  normal external meatus.         Uterus:  normal size/shape/consistency, freely mobile, non tender.         Vagina:  pink/moist mucosa, no lesions, no abnormal discharge, odorless.         Vulva:  normal, no lesions, no skin discoloration.   EXTREMITIES:         Extremities  no clubbing cyanosis or edema present.  NEUROLOGICAL:         gross motor and sensory  grossly intact.         Orientation:  alert and oriented x 3.      Pt aware of scribe services today.     Assessments     1. Encounter for other preprocedural examination - Z01.818 (Primary)   2. Screening for diabetes mellitus - Z13.1   3. Abnormal uterine bleeding - N93.9        Treatment   1. Encounter for other preprocedural examination   Notes: We have discused the hysteroscopy/D&C w/ HTA (hydrothermal ablation) to be performed on 02/23/2020 with the pt. We have informed the pt that we will not be giving her antibiotics prior to her surgery. We have discussed the risk of a uterine preforation with the pt, but have informed her that such risk is low and should it occur it will be resolved during the surgery. We have also dicsussed the process of the  procedure with the pt, stating that we will examine her uterine fibroids under the scope and her fibroids will remove the fibroiids prior to ablation. Informational pamphlet provided to pt. We have also discussed hysterectomy as an option with the pt, as it, while a more involved process, will be a permanent solution to her fibroids and will stop her menses, and the current procedure may only provide a temporary solution to her fibroids and may only make her menses later. We have informed the pt that she will be unable to have children after the surgery is done. We have offered insertion of Mirena IUD at the time of the surgery as well.      2. Screening for diabetes mellitus        LAB: Hemoglobin A1c   3. Abnormal uterine bleeding            Referral To:               Reason:Add IUD insertion (Mirena) to procedure on 02/23/2020 as well         Procedures  Venipuncture:         Venipuncture:  Gasanova,Svetlana 02/11/2020 11:47:14 AM > , performed in right arm.             Labs         Lab: Hemoglobin A1c 5.9         Value Reference Range       HGB A1C 5.9 H 4.8-5.6 - %        Estimated Ave Glucose 123  - mg/dL                 Randy Whitener B 02/12/2020 04:41:01 PM > Much improved. Keep up the good work! Whitfield,Dia 02/14/2020 08:56:51 AM > called and spoke with pt-made her aware of results.    Visit Codes   99213 OV LEVEL 3.         Follow Up  2 weeks post surgery on 02/23/2020

## 2020-02-24 ENCOUNTER — Encounter (HOSPITAL_BASED_OUTPATIENT_CLINIC_OR_DEPARTMENT_OTHER): Payer: Self-pay | Admitting: Obstetrics and Gynecology

## 2020-02-24 LAB — SURGICAL PATHOLOGY

## 2020-06-09 ENCOUNTER — Ambulatory Visit: Payer: PRIVATE HEALTH INSURANCE | Admitting: Sports Medicine

## 2020-06-12 ENCOUNTER — Ambulatory Visit: Payer: Self-pay

## 2020-06-12 ENCOUNTER — Ambulatory Visit (INDEPENDENT_AMBULATORY_CARE_PROVIDER_SITE_OTHER): Payer: PRIVATE HEALTH INSURANCE | Admitting: Sports Medicine

## 2020-06-12 DIAGNOSIS — G8929 Other chronic pain: Secondary | ICD-10-CM

## 2020-06-12 DIAGNOSIS — M5416 Radiculopathy, lumbar region: Secondary | ICD-10-CM | POA: Diagnosis not present

## 2020-06-12 DIAGNOSIS — M533 Sacrococcygeal disorders, not elsewhere classified: Secondary | ICD-10-CM | POA: Diagnosis not present

## 2020-06-12 NOTE — Assessment & Plan Note (Signed)
She did have a left-sided L5-S1 interlaminar epidural back in 2016, if she fails the right-sided SI joint we will consider a right-sided L5-S1 interlaminar epidural again. We would need lumbar spine advanced imaging prior.

## 2020-06-12 NOTE — Assessment & Plan Note (Signed)
This is a pleasant 44 year old female, she is having a recurrence of low back pain after aggressive treatment back in January, localized to the right SI joint. I do think she has some pain coming from her L5-S1 level as well. Today we injected her sacroiliac joint for diagnostic and therapeutic purposes. Return to see me in a month, we will proceed with lumbar spine advanced imaging and intervention if no better.

## 2020-06-12 NOTE — Progress Notes (Signed)
    Procedures performed today:    Procedure: Real-time Ultrasound Guided injection of the right sacroiliac joint Device: Samsung HS60  Verbal informed consent obtained.  Time-out conducted.  Noted no overlying erythema, induration, or other signs of local infection.  Skin prepped in a sterile fashion.  Local anesthesia: Topical Ethyl chloride.  With sterile technique and under real time ultrasound guidance:  Noted slightly arthritic SI joint, 22-gauge needle advanced in and 1 cc Kenalog 40, 2 cc lidocaine, 2 cc bupivacaine injected easily Completed without difficulty  Pain immediately resolved suggesting accurate placement of the medication.  Advised to call if fevers/chills, erythema, induration, drainage, or persistent bleeding.  Images permanently stored and available for review in PACS.  Impression: Technically successful ultrasound guided injection.  Independent interpretation of notes and tests performed by another provider:   None.  Brief History, Exam, Impression, and Recommendations:    Chronic right SI joint pain This is a pleasant 44 year old female, she is having a recurrence of low back pain after aggressive treatment back in January, localized to the right SI joint. I do think she has some pain coming from her L5-S1 level as well. Today we injected her sacroiliac joint for diagnostic and therapeutic purposes. Return to see me in a month, we will proceed with lumbar spine advanced imaging and intervention if no better.  Left lumbar radiculitis She did have a left-sided L5-S1 interlaminar epidural back in 2016, if she fails the right-sided SI joint we will consider a right-sided L5-S1 interlaminar epidural again. We would need lumbar spine advanced imaging prior.    ___________________________________________ Gwen Her. Dianah Field, M.D., ABFM., CAQSM. Primary Care and [place] Instructor of [place] of [facility] of Medicine

## 2020-06-29 ENCOUNTER — Telehealth: Payer: Self-pay | Admitting: Family Medicine

## 2020-06-29 NOTE — Telephone Encounter (Signed)
Called pt to discuss lab results. A1c is 6.8 which indicates a progression to diabetes. She is resistant to starting metformin. She would like to continue with lifestyle changes for now. She has an appointment with an integrative health MD the end of this month and would like to wait to start any medications until she sees them and their opinion. Recommend increasing physical activity, decreasing sugary drinks/food and limiting carbohydrates. F/u A1c in 3 months. She should schedule an appt should she like to discuss treatment options further.

## 2020-07-18 ENCOUNTER — Ambulatory Visit: Payer: PRIVATE HEALTH INSURANCE | Admitting: Sports Medicine

## 2021-01-19 ENCOUNTER — Ambulatory Visit: Payer: PRIVATE HEALTH INSURANCE | Admitting: Sports Medicine

## 2021-01-19 ENCOUNTER — Other Ambulatory Visit: Payer: Self-pay

## 2021-01-19 DIAGNOSIS — M533 Sacrococcygeal disorders, not elsewhere classified: Secondary | ICD-10-CM | POA: Diagnosis not present

## 2021-01-19 DIAGNOSIS — G8929 Other chronic pain: Secondary | ICD-10-CM

## 2021-01-19 DIAGNOSIS — M5416 Radiculopathy, lumbar region: Secondary | ICD-10-CM | POA: Diagnosis not present

## 2021-01-19 DIAGNOSIS — M2550 Pain in unspecified joint: Secondary | ICD-10-CM | POA: Diagnosis not present

## 2021-01-19 MED ORDER — PREDNISONE 50 MG PO TABS: ORAL_TABLET | ORAL | 0 refills | Status: DC

## 2021-01-19 NOTE — Progress Notes (Signed)
    Procedures performed today:    None.  Independent interpretation of notes and tests performed by another provider:   None.  Brief History, Exam, Impression, and Recommendations:    Polyarthralgia This pleasant 45 year old female has multifactorial low back and hip pain, historically we have injected her sacroiliac joint that resulted in good relief, today she is complaining of pain that is somewhat different, mostly hip, thigh girdle, worse when sitting for periods of time, achiness all the way down the legs. I did review her lumbar spine MRI from 2016, there really is not anything on this MRI to explain pain going all the way down the thighs and legs. No discrete areas of obvious neuroforaminal stenosis. For this reason we are going to proceed out an autoimmune pathway, looking for polymyalgia rheumatica, adding a full rheumatoid panel, 5 days of prednisone. Return to see me in a month, if insufficient improvement we will consider repeating her lumbar spine MRI potentially injecting her SI joint again.  Her pain is a chronic process with exacerbation and pharmacologic intervention.  Chronic right SI joint pain She did really well after a right sacroiliac joint injection back in October of last year, but the pain today feels somewhat different and more generalized than her previous SI joint pain.  Left lumbar radiculitis Of note she also had a left-sided L5-S1 interlaminar epidural back in 2016, if the autoimmune work-up is unrevealing and if she has predominantly left-sided pain we will consider repeat left L5-S1 interlaminar epidural after updated lumbar spine imaging.    ___________________________________________ Gwen Her. Dianah Field, M.D., ABFM., CAQSM. Primary Care and [place] Instructor of [place] of [facility] of Medicine

## 2021-01-19 NOTE — Assessment & Plan Note (Addendum)
This pleasant 45 year old female has multifactorial low back and hip pain, historically we have injected her sacroiliac joint that resulted in good relief, today she is complaining of pain that is somewhat different, mostly hip, thigh girdle, worse when sitting for periods of time, achiness all the way down the legs. I did review her lumbar spine MRI from 2016, there really is not anything on this MRI to explain pain going all the way down the thighs and legs. No discrete areas of obvious neuroforaminal stenosis. For this reason we are going to proceed out an autoimmune pathway, looking for polymyalgia rheumatica, adding a full rheumatoid panel, 5 days of prednisone. Return to see me in a month, if insufficient improvement we will consider repeating her lumbar spine MRI potentially injecting her SI joint again.  Her pain is a chronic process with exacerbation and pharmacologic intervention.

## 2021-01-19 NOTE — Assessment & Plan Note (Signed)
She did really well after a right sacroiliac joint injection back in October of last year, but the pain today feels somewhat different and more generalized than her previous SI joint pain.

## 2021-01-19 NOTE — Assessment & Plan Note (Signed)
Of note she also had a left-sided L5-S1 interlaminar epidural back in 2016, if the autoimmune work-up is unrevealing and if she has predominantly left-sided pain we will consider repeat left L5-S1 interlaminar epidural after updated lumbar spine imaging.

## 2021-01-24 LAB — CBC WITH DIFFERENTIAL/PLATELET
Absolute Monocytes: 444 cells/uL (ref 200–950)
Basophils Absolute: 37 cells/uL (ref 0–200)
Basophils Relative: 0.5 %
Eosinophils Absolute: 163 cells/uL (ref 15–500)
Eosinophils Relative: 2.2 %
HCT: 41.3 % (ref 35.0–45.0)
Hemoglobin: 14 g/dL (ref 11.7–15.5)
Lymphs Abs: 1909 cells/uL (ref 850–3900)
MCH: 30.8 pg (ref 27.0–33.0)
MCHC: 33.9 g/dL (ref 32.0–36.0)
MCV: 90.8 fL (ref 80.0–100.0)
MPV: 9.5 fL (ref 7.5–12.5)
Monocytes Relative: 6 %
Neutro Abs: 4847 cells/uL (ref 1500–7800)
Neutrophils Relative %: 65.5 %
Platelets: 263 10*3/uL (ref 140–400)
RBC: 4.55 10*6/uL (ref 3.80–5.10)
RDW: 11.7 % (ref 11.0–15.0)
Total Lymphocyte: 25.8 %
WBC: 7.4 10*3/uL (ref 3.8–10.8)

## 2021-01-24 LAB — CK: Total CK: 73 U/L (ref 29–143)

## 2021-01-24 LAB — COMPREHENSIVE METABOLIC PANEL
AG Ratio: 1.6 (calc) (ref 1.0–2.5)
ALT: 21 U/L (ref 6–29)
AST: 16 U/L (ref 10–30)
Albumin: 4.2 g/dL (ref 3.6–5.1)
Alkaline phosphatase (APISO): 58 U/L (ref 31–125)
BUN: 18 mg/dL (ref 7–25)
CO2: 27 mmol/L (ref 20–32)
Calcium: 9.3 mg/dL (ref 8.6–10.2)
Chloride: 100 mmol/L (ref 98–110)
Creat: 0.81 mg/dL (ref 0.50–1.10)
Globulin: 2.7 g/dL (calc) (ref 1.9–3.7)
Glucose, Bld: 132 mg/dL — ABNORMAL HIGH (ref 65–99)
Potassium: 3.6 mmol/L (ref 3.5–5.3)
Sodium: 136 mmol/L (ref 135–146)
Total Bilirubin: 0.7 mg/dL (ref 0.2–1.2)
Total Protein: 6.9 g/dL (ref 6.1–8.1)

## 2021-01-24 LAB — LUPUS(12) PANEL
Anti Nuclear Antibody (ANA): NEGATIVE
C3 Complement: 114 mg/dL (ref 83–193)
C4 Complement: 15 mg/dL (ref 15–57)
ENA SM Ab Ser-aCnc: <1 AI
Rheumatoid fact SerPl-aCnc: <14 IU/mL (ref <14)
Ribosomal P Protein Ab: <1 AI
SM/RNP: <1 AI
SSA (Ro) (ENA) Antibody, IgG: <1 AI
SSB (La) (ENA) Antibody, IgG: <1 AI
Scleroderma (Scl-70) (ENA) Antibody, IgG: <1 AI
Thyroperoxidase Ab SerPl-aCnc: <1 IU/mL (ref <9)
ds DNA Ab: <1 IU/mL

## 2021-01-24 LAB — URIC ACID: Uric Acid, Serum: 3.6 mg/dL (ref 2.5–7.0)

## 2021-01-24 LAB — RHEUMATOID FACTOR (IGA, IGG, IGM)
Rheumatoid Factor (IgA): <5 U (ref <=6)
Rheumatoid Factor (IgG): <5 U (ref <=6)
Rheumatoid Factor (IgM): <5 U (ref <=6)

## 2021-01-24 LAB — CYCLIC CITRUL PEPTIDE ANTIBODY, IGG: Cyclic Citrullin Peptide Ab: <16 UNITS

## 2021-01-24 LAB — HLA-B27 ANTIGEN: HLA-B27 Antigen: POSITIVE — AB

## 2021-01-24 LAB — SEDIMENTATION RATE: Sed Rate: 2 mm/h (ref 0–20)

## 2021-01-26 ENCOUNTER — Telehealth: Payer: Self-pay

## 2021-01-26 NOTE — Telephone Encounter (Signed)
In her case it does not mean anything yet.  She does not really have the symptoms of ankylosing spondylitis so I would consider it more of a false positive at this juncture.  We can talk about this in further detail at her follow-up.

## 2021-01-26 NOTE — Telephone Encounter (Signed)
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 She wants to know what that means. Please advise.

## 2021-01-29 NOTE — Telephone Encounter (Signed)
Patient is aware of the labs results and will wait to talk about it at the follow up appt.   She reports having some vaginal irritation and discharge after taking the prednisone. She states that it ran her sugars up which she thinks has caused a yeast infection. She would like a diflucan sent to [place]

## 2021-01-29 NOTE — Telephone Encounter (Signed)
Outside of my area of expertise, this probably needs to go to her PCP.

## 2021-01-29 NOTE — Telephone Encounter (Signed)
Patient aware of Dr. Darene Lamer recommendation.

## 2021-02-16 ENCOUNTER — Ambulatory Visit: Payer: PRIVATE HEALTH INSURANCE | Admitting: Sports Medicine

## 2021-02-16 ENCOUNTER — Other Ambulatory Visit: Payer: Self-pay

## 2021-02-16 DIAGNOSIS — M5416 Radiculopathy, lumbar region: Secondary | ICD-10-CM

## 2021-02-16 DIAGNOSIS — M2550 Pain in unspecified joint: Secondary | ICD-10-CM | POA: Diagnosis not present

## 2021-02-16 DIAGNOSIS — M533 Sacrococcygeal disorders, not elsewhere classified: Secondary | ICD-10-CM

## 2021-02-16 DIAGNOSIS — G8929 Other chronic pain: Secondary | ICD-10-CM | POA: Diagnosis not present

## 2021-02-16 MED ORDER — NAPROXEN 500 MG PO TABS: 500.0000 mg | ORAL_TABLET | Freq: Two times a day (BID) | ORAL | 11 refills | Status: DC

## 2021-02-16 NOTE — Progress Notes (Signed)
    Procedures performed today:    None.  Independent interpretation of notes and tests performed by another provider:   None.  Brief History, Exam, Impression, and Recommendations:    Polyarthralgia This pleasant 45 year old female returns, we have been evaluating her for polyarthralgia, she had a completely normal lupus, rheumatoid type work-up. She endorses occasional discomfort, ankle for a couple of days then the knee for several days, but the symptoms resolved. Frankly I think this is just normal life, she has no evidence of underlying autoimmune disease, and likely just has mild degenerative processes. We will do naproxen 5 or twice a day, she will try to stay active and return to see me as needed.  Left lumbar radiculitis Back pain is doing okay now, she did have known left L5-S1 DDD and had an epidural back in 2016 that provided good relief. If she has recurrence of axial discogenic back pain or radicular pain we will get an updated MRI and discuss an updated epidural.  Chronic right SI joint pain Did well after a right sacroiliac joint injection back in October 2021, back pain continues to do well, no intervention needed.    ___________________________________________ Gwen Her. Dianah Field, M.D., ABFM., CAQSM. Primary Care and [place] Instructor of [place] of [facility] of Medicine

## 2021-02-16 NOTE — Assessment & Plan Note (Signed)
Back pain is doing okay now, she did have known left L5-S1 DDD and had an epidural back in 2016 that provided good relief. If she has recurrence of axial discogenic back pain or radicular pain we will get an updated MRI and discuss an updated epidural.

## 2021-02-16 NOTE — Assessment & Plan Note (Signed)
Did well after a right sacroiliac joint injection back in October 2021, back pain continues to do well, no intervention needed.

## 2021-02-16 NOTE — Assessment & Plan Note (Signed)
This pleasant 45 year old female returns, we have been evaluating her for polyarthralgia, she had a completely normal lupus, rheumatoid type work-up. She endorses occasional discomfort, ankle for a couple of days then the knee for several days, but the symptoms resolved. Frankly I think this is just normal life, she has no evidence of underlying autoimmune disease, and likely just has mild degenerative processes. We will do naproxen 5 or twice a day, she will try to stay active and return to see me as needed.

## 2021-03-05 NOTE — Telephone Encounter (Signed)
Virtual has been scheduled for 03/13/21.AM

## 2021-03-13 ENCOUNTER — Telehealth (INDEPENDENT_AMBULATORY_CARE_PROVIDER_SITE_OTHER): Payer: PRIVATE HEALTH INSURANCE | Admitting: Sports Medicine

## 2021-03-13 DIAGNOSIS — M5416 Radiculopathy, lumbar region: Secondary | ICD-10-CM

## 2021-03-13 DIAGNOSIS — M170 Bilateral primary osteoarthritis of knee: Secondary | ICD-10-CM | POA: Diagnosis not present

## 2021-03-13 NOTE — Assessment & Plan Note (Signed)
Complaining of knee pain, she had some trouble getting up from a squatting position recently, pain and swelling, suspect osteoarthritis, x-rays from a couple of years ago did show mild degenerative changes. We will get some updated x-rays and she will return to see me at her leisure for knee joint injection.

## 2021-03-13 NOTE — Progress Notes (Signed)
Per patient, the naproxen is not helping as much as the ibuprofen was. Patient is thinking she may want to go back to the ibuprofen unless you have other ideas.

## 2021-03-13 NOTE — Progress Notes (Signed)
   Virtual Visit via WebEx/MyChart   I connected with  Lori Cameron  on 03/13/21 via WebEx/MyChart/Doximity Video and verified that I am speaking with the correct person using two identifiers.   I discussed the limitations, risks, security and privacy concerns of performing an evaluation and management service by WebEx/MyChart/Doximity Video, including the higher likelihood of inaccurate diagnosis and treatment, and the availability of in person appointments.  We also discussed the likely need of an additional face to face encounter for complete and high quality delivery of care.  I also discussed with the patient that there may be a patient responsible charge related to this service. The patient expressed understanding and wishes to proceed.  Provider location is in medical facility. Patient location is at their home, different from provider location. People involved in care of the patient during this telehealth encounter were myself, my nurse/medical assistant, and my front office/scheduling team member.  Review of Systems: No fevers, chills, night sweats, weight loss, chest pain, or shortness of breath.   Objective Findings:    General: Speaking full sentences, no audible heavy breathing.  Sounds alert and appropriately interactive.  Appears well.  Face symmetric.  Extraocular movements intact.  Pupils equal and round.  No nasal flaring or accessory muscle use visualized.  Independent interpretation of tests performed by another provider:   None.  Brief History, Exam, Impression, and Recommendations:    Left lumbar radiculitis Back pain is overall doing okay, she does have known left L5-S1 DDD with an epidural back in 2016 that provided good relief, for now she plans to just watch it, we are going to go ahead and get an updated x-ray just in case we need to proceed with MRI in the future. Last epidural was 2016 as well.  Primary osteoarthritis of both knees Complaining of knee  pain, she had some trouble getting up from a squatting position recently, pain and swelling, suspect osteoarthritis, x-rays from a couple of years ago did show mild degenerative changes. We will get some updated x-rays and she will return to see me at her leisure for knee joint injection.   I discussed the above assessment and treatment plan with the patient. The patient was provided an opportunity to ask questions and all were answered. The patient agreed with the plan and demonstrated an understanding of the instructions.   The patient was advised to call back or seek an in-person evaluation if the symptoms worsen or if the condition fails to improve as anticipated.   I provided 30 minutes of face to face and non-face-to-face time during this encounter date, time was needed to gather information, review chart, records, communicate/coordinate with staff remotely, as well as complete documentation.  Specifically I gave her some anticipatory guidance with what to expect regarding her lumbar DDD.   ___________________________________________ Gwen Her. Dianah Field, M.D., ABFM., CAQSM. Primary Care and [place] Instructor of [place] of [facility] of Medicine

## 2021-03-13 NOTE — Assessment & Plan Note (Signed)
Back pain is overall doing okay, she does have known left L5-S1 DDD with an epidural back in 2016 that provided good relief, for now she plans to just watch it, we are going to go ahead and get an updated x-ray just in case we need to proceed with MRI in the future. Last epidural was 2016 as well.

## 2021-03-14 ENCOUNTER — Other Ambulatory Visit: Payer: Self-pay

## 2021-03-14 ENCOUNTER — Ambulatory Visit (INDEPENDENT_AMBULATORY_CARE_PROVIDER_SITE_OTHER): Payer: PRIVATE HEALTH INSURANCE

## 2021-03-14 DIAGNOSIS — M549 Dorsalgia, unspecified: Secondary | ICD-10-CM | POA: Diagnosis not present

## 2021-03-14 DIAGNOSIS — M5416 Radiculopathy, lumbar region: Secondary | ICD-10-CM | POA: Diagnosis not present

## 2021-03-14 DIAGNOSIS — M170 Bilateral primary osteoarthritis of knee: Secondary | ICD-10-CM

## 2021-03-15 DIAGNOSIS — M2550 Pain in unspecified joint: Secondary | ICD-10-CM

## 2021-03-16 NOTE — Assessment & Plan Note (Signed)
Normal lupus, rheumatoid work-up, she does have degenerative processes in her lumbar spine, knees. She is requesting a rheumatoid consultation, I do think she is more of a degenerative patient rather than a rheumatoid patient, but I will oblige her with a referral to Dr. Estanislado Pandy per her request.

## 2021-03-23 ENCOUNTER — Ambulatory Visit: Payer: PRIVATE HEALTH INSURANCE | Admitting: Sports Medicine

## 2021-05-03 NOTE — Progress Notes (Signed)
Office Visit Note  Patient: Lori Cameron             Date of Birth: March 06, 1976           MRN: [id number]             PCP: Seward Carol, MD Referring: Silverio Decamp,* Visit Date: 05/14/2021 Occupation: _0 @  Subjective:  New Patient (Initial Visit) (Abnormal labs, bil hand, bil knees and low back joint pain)   History of Present Illness: Lori Cameron is a 45 y.o. female seen in consultation per request of Dr. Dianah Field.  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  She states she has had lower back pain for many years.  About 1 year ago she was experiencing increased lower back pain and SI joint pain.  She states she had SI joint injection by Dr. Darene Lamer which helped.  She had recurrence of SI joint pain for which she was given a prednisone taper.  She has had no recurrence of SI joint pain since then.  She states for the last 2 to 3 months she has been experiencing increased pain in her hands and her feet.  The pain gets worse especially after prolonged sitting she also experiences pain in her bilateral lower extremities.  She is off-and-on pain in the right trochanter area.  She states Dr. Dianah Field did lab work her and her HLA-B27 was positive.  About a month ago she developed pain and discomfort in her right great toe she says the pain lasted for 4 days the pain was severe but without much swelling or warmth.  She was seen at orthopedic urgent care where she was given the diagnosis of gout and was given a prednisone taper which helped.  She had no recurrence of pain in her toe.  She continues to have discomfort in her bilateral hands, right trochanter, bilateral feet.  She states her right knee joint has been painful since June 2022 which gradually got worse.  She was seen at [facility] urgent care where she was given a cortisone injection without much relief.  She had MRI of her knee joint which was consistent with  meniscal tear.  She was advised surgery.  There is no family history of autoimmune disease in her immediate family she states she has a third cousin with scleroderma and lupus.  She is gravida 1, para 0, abortion 1.  There is no history of DVTs.  She had uterine ablation and she has Mirena IUD  Activities of Daily Living:  Patient reports morning stiffness for 0  none .   Patient Reports nocturnal pain.  Difficulty dressing/grooming: Denies Difficulty climbing stairs: Denies Difficulty getting out of chair: Denies Difficulty using hands for taps, buttons, cutlery, and/or writing: Reports  Review of Systems  Constitutional:  Positive for fatigue. Negative for night sweats, weight gain and weight loss.  HENT:  Positive for mouth dryness. Negative for mouth sores, trouble swallowing, trouble swallowing and nose dryness.   Eyes:  Positive for discharge. Negative for pain, redness, visual disturbance and dryness.  Respiratory:  Negative for cough, shortness of breath and difficulty breathing.   Cardiovascular:  Positive for swelling in legs/feet. Negative for chest pain, palpitations, hypertension and irregular heartbeat.  Gastrointestinal:  Negative for blood in stool, constipation and diarrhea.  Endocrine: Positive for heat intolerance, excessive thirst and increased urination.  Genitourinary:  Negative for difficulty urinating and vaginal dryness.  Musculoskeletal:  Positive for joint pain, joint pain and joint swelling. Negative for myalgias, muscle weakness, morning stiffness, muscle tenderness and myalgias.  Skin:  Negative for rash, hair loss, skin tightness, ulcers and sensitivity to sunlight.  Allergic/Immunologic: Negative for susceptible to infections.  Neurological:  Positive for numbness. Negative for dizziness, memory loss, night sweats and weakness.  Hematological:  Negative for bruising/bleeding tendency and swollen glands.  Psychiatric/Behavioral:  Positive for depressed mood and  sleep disturbance. The patient is nervous/anxious.    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   C&B/ LEEP AUGUST 2010 WITH CIN 1 AND MARGINS FREE   Hypertension    PONV (postoperative nausea and vomiting)     Family History  Problem Relation Age of Onset   Diabetes Mother    Hypertension Mother    Cancer Mother        Cervical   Healthy Sister    Healthy Sister    Healthy Brother    Healthy Brother    Hypertension Maternal Grandmother    Heart disease Maternal Grandmother    Cancer Maternal Aunt        colon   Past Surgical History:  Procedure Laterality Date   ABDOMINAL SURGERY     LAPAROTOMY APPENDECTOMY, LYSIS OF ADHESIONS   APPENDECTOMY     CERVICAL BIOPSY  W/ LOOP ELECTRODE EXCISION     CINI MARGINS FREE   DILITATION & CURRETTAGE/HYSTROSCOPY WITH HYDROTHERMAL ABLATION N/A 02/23/2020   Procedure: DILATATION & CURETTAGE/HYSTEROSCOPY WITH HYDROTHERMAL ABLATION;  Surgeon: Varnado, Evelyn, MD;  Location: [facility];  Service: Gynecology;  Laterality: N/A;   INTRAUTERINE DEVICE (IUD) INSERTION N/A 02/23/2020   Procedure: INTRAUTERINE DEVICE (IUD) INSERTION MARENA;  Surgeon: Varnado, Evelyn, MD;  Location: [facility];  Service: Gynecology;  Laterality: N/A;   TONSILLECTOMY AND ADENOIDECTOMY     Social History   Social History Narrative   Not on file    There is no immunization history  on file for this patient.   Objective: Vital Signs: BP 127/82 (BP Location: Right Arm, Patient Position: Sitting, Cuff Size: Normal)   Pulse 78   Resp 12   Ht 5' 3.75" (1.619 m)   Wt 168 lb 6.4 oz (76.4 kg)   BMI 29.13 kg/m    Physical Exam Vitals and nursing note reviewed.  Constitutional:      Appearance: She is well-developed.  HENT:     Head: Normocephalic and atraumatic.  Eyes:     Conjunctiva/sclera: Conjunctivae normal.  Cardiovascular:     Rate and Rhythm: Normal rate and regular rhythm.     Heart sounds: Normal heart sounds.  Pulmonary:     Effort: Pulmonary effort is normal.     Breath sounds: Normal breath sounds.  Abdominal:     General: Bowel sounds are normal.     Palpations: Abdomen is soft.  Musculoskeletal:       Cervical back: Normal range of motion.  Lymphadenopathy:     Cervical: No cervical adenopathy.  Skin:    General: Skin is warm and dry.     Capillary Refill: Capillary refill takes less than 2 seconds.  Neurological:     Mental Status: She is alert and oriented to person, place, and time.  Psychiatric:        Behavior: Behavior normal.     Musculoskeletal Exam: C-spine thoracic and lumbar spine were in good range of motion.  She is some tenderness on palpation of her SI joints.  Shoulder joints, elbow joints, wrist joints, MCPs PIPs and DIPs with good range of motion with no synovitis.  Hip joints with good range of motion.  She had some warmth on palpation of her right knee joint with painful range of motion.  Left knee joint was in full range of motion.  There was no tenderness over ankles, Achilles tendon, plantar fascial or MTPs.  CDAI Exam: CDAI Score: -- Patient Global: --; Provider Global: -- Swollen: --; Tender: -- Joint Exam 05/14/2021   No joint exam has been documented for this visit   There is currently no information documented on the homunculus. Go to the Rheumatology activity and complete the homunculus joint  exam.  Investigation: No additional findings.  Imaging: No results found.  Recent Labs: Lab Results  Component Value Date   WBC 7.4 01/19/2021   HGB 14.0 01/19/2021   PLT 263 01/19/2021   NA 136 01/19/2021   K 3.6 01/19/2021   CL 100 01/19/2021   CO2 27 01/19/2021   GLUCOSE 132 (H) 01/19/2021   BUN 18 01/19/2021   CREATININE 0.81 01/19/2021   BILITOT 0.7 01/19/2021   ALKPHOS 52 12/27/2013   AST 16 01/19/2021   ALT 21 01/19/2021   PROT 6.9 01/19/2021   ALBUMIN 4.0 12/27/2013   CALCIUM 9.3 01/19/2021   GFRAA >60 02/21/2020    Speciality Comments: No specialty comments available.  Procedures:  No procedures performed Allergies: Amoxicillin-pot clavulanate and Clindamycin hcl   Assessment / Plan:     Visit Diagnoses: Polyarthralgia - 01/19/21: ANA-, complements WNL, dsDNA-, ribosomal P protein-, SM ab-, SM/RNP-, Ro-, La-, thyroperoxidase ab-, Scl-70-, RF-, CK 73, uric acid 3.6, ESR 2, CCP ab6  Pain in both hands -she complains of pain and discomfort in her bilateral hands.  She gives history of intermittent swelling.  No synovitis was noted.  All autoimmune work-up was negative.  HLA-B27 was positive.  Plan: XR Hand 2 View Right, XR Hand 2 View Left.  X-ray of bilateral hands were unremarkable.  Pain in both feet -she gives history of severe pain and discomfort in her right first MTP joint few weeks ago.  She was treated with prednisone.  I reviewed her labs and her uric acid was normal in May.  It is unusual for her to have gout at her age.  Advised her to contact me in case she develops swelling again.  Plan: XR Foot 2 Views Right, XR Foot 2 Views Left.  X-ray of bilateral feet were unremarkable.  HLA B27 (HLA B27 positive)-significance of positive HLA-B27 was discussed.  She had no synovitis on examination.  There is no history of uveitis, IBD, psoriasis, Achilles tendinitis or plantar fasciitis.  She gives history of joint pain.  No synovitis was noted.  Chronic right SI  joint pain -she has had cortisone injection in her SI joint and also a prednisone taper by Dr. Thekkekandam in the past.    She had mild tenderness over the right SI joint.  Plan: XR Pelvis 1-2 Views.  No SI joint sclerosis or narrowing was noted.  Left lumbar radiculitis-she gives history of lower back pain.  I reviewed her prior x-rays which were unremarkable except for some facet joint arthropathy.  Primary osteoarthritis of both knees-I reviewed her previous x-rays as well.  Patient states her right knee joint MRI recently showed meniscal tear and she will require surgery in the future.  History of asthma  History of depression  Intramural leiomyoma of uterus  Orders: Orders Placed This Encounter  Procedures   XR Hand 2 View Right   XR Hand 2 View Left   XR Foot 2 Views Right   XR Foot 2 Views Left   XR Pelvis 1-2 Views    No orders of the defined types were placed in this encounter.   Follow-Up Instructions: Return for Polyarthralgia, HLA-B27 positive.   Shaili Deveshwar, MD  Note - This record has been created using Dragon software.  Chart creation errors have been sought, but may not always  have been located. Such creation errors do not reflect on  the standard of medical care.  

## 2021-05-14 ENCOUNTER — Ambulatory Visit: Payer: Self-pay

## 2021-05-14 ENCOUNTER — Ambulatory Visit: Payer: PRIVATE HEALTH INSURANCE | Admitting: Rheumatology

## 2021-05-14 ENCOUNTER — Encounter: Payer: Self-pay | Admitting: Rheumatology

## 2021-05-14 ENCOUNTER — Other Ambulatory Visit: Payer: Self-pay

## 2021-05-14 VITALS — BP 127/82 | HR 78 | Resp 12 | Ht 63.75 in | Wt 168.4 lb

## 2021-05-14 DIAGNOSIS — M5416 Radiculopathy, lumbar region: Secondary | ICD-10-CM

## 2021-05-14 DIAGNOSIS — D251 Intramural leiomyoma of uterus: Secondary | ICD-10-CM

## 2021-05-14 DIAGNOSIS — G8929 Other chronic pain: Secondary | ICD-10-CM

## 2021-05-14 DIAGNOSIS — M533 Sacrococcygeal disorders, not elsewhere classified: Secondary | ICD-10-CM | POA: Diagnosis not present

## 2021-05-14 DIAGNOSIS — M170 Bilateral primary osteoarthritis of knee: Secondary | ICD-10-CM

## 2021-05-14 DIAGNOSIS — M79641 Pain in right hand: Secondary | ICD-10-CM

## 2021-05-14 DIAGNOSIS — M79642 Pain in left hand: Secondary | ICD-10-CM

## 2021-05-14 DIAGNOSIS — M79671 Pain in right foot: Secondary | ICD-10-CM | POA: Diagnosis not present

## 2021-05-14 DIAGNOSIS — Z1589 Genetic susceptibility to other disease: Secondary | ICD-10-CM | POA: Diagnosis not present

## 2021-05-14 DIAGNOSIS — Z8709 Personal history of other diseases of the respiratory system: Secondary | ICD-10-CM

## 2021-05-14 DIAGNOSIS — Z8659 Personal history of other mental and behavioral disorders: Secondary | ICD-10-CM

## 2021-05-14 DIAGNOSIS — M79672 Pain in left foot: Secondary | ICD-10-CM | POA: Diagnosis not present

## 2021-05-14 DIAGNOSIS — M2550 Pain in unspecified joint: Secondary | ICD-10-CM

## 2021-05-14 NOTE — Patient Instructions (Signed)
Hand Exercises Hand exercises can be helpful for almost anyone. These exercises can strengthen the hands, improve flexibility and movement, and increase blood flow to the hands. These results can make work and daily tasks easier. Hand exercises can be especially helpful for people who have joint pain from arthritis or have nerve damage from overuse (carpal tunnel syndrome). These exercises can also help people who have injured a hand. Exercises Most of these hand exercises are gentle stretching and motion exercises. It is usually safe to do them often throughout the day. Warming up your hands before exercise may help to reduce stiffness. You can do this with gentle massage or by placing your hands in warm water for 10-15 minutes. It is normal to feel some stretching, pulling, tightness, or mild discomfort as you begin new exercises. This will gradually improve. Stop an exercise right away if you feel sudden, severe pain or your pain gets worse. Ask your health care provider which exercises are best for you. Knuckle bend or "claw" fist  Stand or sit with your arm, hand, and all five fingers pointed straight up. Make sure to keep your wrist straight during the exercise. Gently bend your fingers down toward your palm until the tips of your fingers are touching the top of your palm. Keep your big knuckle straight and just bend the small knuckles in your fingers. Hold this position for __________ seconds. Straighten (extend) your fingers back to the starting position. Repeat this exercise 5-10 times with each hand. Full finger fist  Stand or sit with your arm, hand, and all five fingers pointed straight up. Make sure to keep your wrist straight during the exercise. Gently bend your fingers into your palm until the tips of your fingers are touching the middle of your palm. Hold this position for __________ seconds. Extend your fingers back to the starting position, stretching every joint fully. Repeat  this exercise 5-10 times with each hand. Straight fist Stand or sit with your arm, hand, and all five fingers pointed straight up. Make sure to keep your wrist straight during the exercise. Gently bend your fingers at the big knuckle, where your fingers meet your hand, and the middle knuckle. Keep the knuckle at the tips of your fingers straight and try to touch the bottom of your palm. Hold this position for __________ seconds. Extend your fingers back to the starting position, stretching every joint fully. Repeat this exercise 5-10 times with each hand. Tabletop  Stand or sit with your arm, hand, and all five fingers pointed straight up. Make sure to keep your wrist straight during the exercise. Gently bend your fingers at the big knuckle, where your fingers meet your hand, as far down as you can while keeping the small knuckles in your fingers straight. Think of forming a tabletop with your fingers. Hold this position for __________ seconds. Extend your fingers back to the starting position, stretching every joint fully. Repeat this exercise 5-10 times with each hand. Finger spread  Place your hand flat on a table with your palm facing down. Make sure your wrist stays straight as you do this exercise. Spread your fingers and thumb apart from each other as far as you can until you feel a gentle stretch. Hold this position for __________ seconds. Bring your fingers and thumb tight together again. Hold this position for __________ seconds. Repeat this exercise 5-10 times with each hand. Making circles  Stand or sit with your arm, hand, and all five fingers pointed   straight up. Make sure to keep your wrist straight during the exercise. Make a circle by touching the tip of your thumb to the tip of your index finger. Hold for __________ seconds. Then open your hand wide. Repeat this motion with your thumb and each finger on your hand. Repeat this exercise 5-10 times with each hand. Thumb  motion  Sit with your forearm resting on a table and your wrist straight. Your thumb should be facing up toward the ceiling. Keep your fingers relaxed as you move your thumb. Lift your thumb up as high as you can toward the ceiling. Hold for __________ seconds. Bend your thumb across your palm as far as you can, reaching the tip of your thumb for the small finger (pinkie) side of your palm. Hold for __________ seconds. Repeat this exercise 5-10 times with each hand. Grip strengthening  Hold a stress ball or other soft ball in the middle of your hand. Slowly increase the pressure, squeezing the ball as much as you can without causing pain. Think of bringing the tips of your fingers into the middle of your palm. All of your finger joints should bend when doing this exercise. Hold your squeeze for __________ seconds, then relax. Repeat this exercise 5-10 times with each hand. Contact a health care provider if: Your hand pain or discomfort gets much worse when you do an exercise. Your hand pain or discomfort does not improve within 2 hours after you exercise. If you have any of these problems, stop doing these exercises right away. Do not do them again unless your health care provider says that you can. Get help right away if: You develop sudden, severe hand pain or swelling. If this happens, stop doing these exercises right away. Do not do them again unless your health care provider says that you can. This information is not intended to replace advice given to you by your health care provider. Make sure you discuss any questions you have with your health care provider. Document Revised: 11/30/2020 Document Reviewed: 11/30/2020 Elsevier Patient Education  [place]. Back Exercises The following exercises strengthen the muscles that help to support the trunk (torso) and back. They also help to keep the lower back flexible. Doing these exercises can help to prevent or lessen existing low  back pain. If you have back pain or discomfort, try doing these exercises 2-3 times each day or as told by your health care provider. As your pain improves, do them once each day, but increase the number of times that you repeat the steps for each exercise (do more repetitions). To prevent the recurrence of back pain, continue to do these exercises once each day or as told by your health care provider. Do exercises exactly as told by your health care provider and adjust them as directed. It is normal to feel mild stretching, pulling, tightness, or discomfort as you do these exercises, but you should stop right away if you feel sudden pain or your pain gets worse. Exercises Single knee to chest Repeat these steps 3-5 times for each leg: Lie on your back on a firm bed or the floor with your legs extended. Bring one knee to your chest. Your other leg should stay extended and in contact with the floor. Hold your knee in place by grabbing your knee or thigh with both hands and hold. Pull on your knee until you feel a gentle stretch in your lower back or buttocks. Hold the stretch for 10-30 seconds.  Slowly release and straighten your leg. Pelvic tilt Repeat these steps 5-10 times: Lie on your back on a firm bed or the floor with your legs extended. Bend your knees so they are pointing toward the ceiling and your feet are flat on the floor. Tighten your lower abdominal muscles to press your lower back against the floor. This motion will tilt your pelvis so your tailbone points up toward the ceiling instead of pointing to your feet or the floor. With gentle tension and even breathing, hold this position for 5-10 seconds. Cat-cow Repeat these steps until your lower back becomes more flexible: Get into a hands-and-knees position on a firm bed or the floor. Keep your hands under your shoulders, and keep your knees under your hips. You may place padding under your knees for comfort. Let your head hang  down toward your chest. Contract your abdominal muscles and point your tailbone toward the floor so your lower back becomes rounded like the back of a cat. Hold this position for 5 seconds. Slowly lift your head, let your abdominal muscles relax, and point your tailbone up toward the ceiling so your back forms a sagging arch like the back of a cow. Hold this position for 5 seconds.  Press-ups Repeat these steps 5-10 times: Lie on your abdomen (face-down) on a firm bed or the floor. Place your palms near your head, about shoulder-width apart. Keeping your back as relaxed as possible and keeping your hips on the floor, slowly straighten your arms to raise the top half of your body and lift your shoulders. Do not use your back muscles to raise your upper torso. You may adjust the placement of your hands to make yourself more comfortable. Hold this position for 5 seconds while you keep your back relaxed. Slowly return to lying flat on the floor.  Bridges Repeat these steps 10 times: Lie on your back on a firm bed or the floor. Bend your knees so they are pointing toward the ceiling and your feet are flat on the floor. Your arms should be flat at your sides, next to your body. Tighten your buttocks muscles and lift your buttocks off the floor until your waist is at almost the same height as your knees. You should feel the muscles working in your buttocks and the back of your thighs. If you do not feel these muscles, slide your feet 1-2 inches (2.5-5 cm) farther away from your buttocks. Hold this position for 3-5 seconds. Slowly lower your hips to the starting position, and allow your buttocks muscles to relax completely. If this exercise is too easy, try doing it with your arms crossed over your chest. Abdominal crunches Repeat these steps 5-10 times: Lie on your back on a firm bed or the floor with your legs extended. Bend your knees so they are pointing toward the ceiling and your feet are flat  on the floor. Cross your arms over your chest. Tip your chin slightly toward your chest without bending your neck. Tighten your abdominal muscles and slowly raise your torso high enough to lift your shoulder blades a tiny bit off the floor. Avoid raising your torso higher than that because it can put too much stress on your lower back and does not help to strengthen your abdominal muscles. Slowly return to your starting position. Back lifts Repeat these steps 5-10 times: Lie on your abdomen (face-down) with your arms at your sides, and rest your forehead on the floor. Tighten the muscles in  your legs and your buttocks. Slowly lift your chest off the floor while you keep your hips pressed to the floor. Keep the back of your head in line with the curve in your back. Your eyes should be looking at the floor. Hold this position for 3-5 seconds. Slowly return to your starting position. Contact a health care provider if: Your back pain or discomfort gets much worse when you do an exercise. Your worsening back pain or discomfort does not lessen within 2 hours after you exercise. If you have any of these problems, stop doing these exercises right away. Do not do them again unless your health care provider says that you can. Get help right away if: You develop sudden, severe back pain. If this happens, stop doing the exercises right away. Do not do them again unless your health care provider says that you can. This information is not intended to replace advice given to you by your health care provider. Make sure you discuss any questions you have with your health care provider. Document Revised: 10/25/2020 Document Reviewed: 10/25/2020 Elsevier Patient Education  [place].

## 2021-05-22 ENCOUNTER — Encounter: Payer: Self-pay | Admitting: Family Medicine

## 2021-05-22 ENCOUNTER — Ambulatory Visit: Payer: PRIVATE HEALTH INSURANCE | Admitting: Family Medicine

## 2021-05-22 VITALS — BP 124/86 | HR 96 | Ht 62.5 in | Wt 165.0 lb

## 2021-05-22 DIAGNOSIS — Z789 Other specified health status: Secondary | ICD-10-CM

## 2021-05-22 NOTE — Progress Notes (Signed)
Subjective:     Patient ID: Lori Cameron, female   DOB: 1976/03/17, 45 y.o.   MRN: [id number]  HPI [place] presents to the [facility] today for her required wellness visit for insurance. Her PCP is Dr. Delfina Redwood. She is due for a mammogram. Her Pap smear is UTD. Discussed colonoscopy at age 41. She is working on [employer] and exercise. Denies any problems or concerns today.   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   C&B/ LEEP AUGUST 2010 WITH CIN 1 AND MARGINS FREE   Hypertension    PONV (postoperative nausea and vomiting)    Allergies  Allergen Reactions   Amoxicillin-Pot Clavulanate Diarrhea   Clindamycin Hcl Diarrhea    Current Outpatient Medications:    citalopram (CELEXA) 10 MG tablet, citalopram 10 mg tablet, Disp: , Rfl:    losartan-hydrochlorothiazide (HYZAAR) 50-12.5 MG tablet, Take 1 tablet by mouth daily., Disp: , Rfl:    MAGNESIUM PO, Take by mouth., Disp: , Rfl:    Omega-3 1000 MG CAPS, Take by mouth., Disp: , Rfl:    VITAMIN D, ERGOCALCIFEROL, PO, Take by mouth., Disp: , Rfl:    VITAMIN K PO, Take by mouth., Disp: , Rfl:    albuterol (VENTOLIN HFA) 108 (90 Base) MCG/ACT inhaler, Ventolin HFA 90 mcg/actuation aerosol inhaler, Disp: , Rfl:    fluticasone furoate-vilanterol (BREO ELLIPTA) 100-25 MCG/INH AEPB, Breo Ellipta 100 mcg-25 mcg/dose powder for inhalation  INHALE 1 PUFF BY MOUTH ONCE DAILY, Disp: , Rfl:    naproxen (NAPROSYN) 500 MG tablet, Take 1 tablet (500 mg total) by mouth 2 (two) times daily with a meal. (Patient not taking: Reported on 05/14/2021), Disp: 60 tablet, Rfl: 11   Testosterone 10 MG/ACT (2%) GEL, Apply 1/4 g - 1/2 g (1-2 clicks) daily to rotating  sites (behind knees or inner thighs), Disp: , Rfl:    Review of Systems  Constitutional:  Negative for chills, fatigue, fever and unexpected weight change.  HENT:  Negative for congestion, ear pain, sinus pressure, sinus pain and sore throat.   Eyes:  Negative for discharge and visual disturbance.  Respiratory:  Negative for cough, shortness of breath and wheezing.   Cardiovascular:  Negative for chest pain and leg swelling.  Gastrointestinal:  Negative for abdominal pain, blood in stool, constipation, diarrhea, nausea and vomiting.  Genitourinary:  Negative for difficulty urinating and hematuria.  Skin:  Negative for color change.  Neurological:  Negative for dizziness, weakness, light-headedness and headaches.  Hematological:  Negative for adenopathy.  All other systems reviewed and are negative.     Objective:   Physical Exam Vitals reviewed.  Constitutional:      General: She is not in acute distress.    Appearance: Normal appearance. She is well-developed.  HENT:     Head: Normocephalic and atraumatic.  Eyes:     General:        Right eye: No discharge.        Left eye: No discharge.  Cardiovascular:     Rate and Rhythm: Normal rate and regular rhythm.     Heart sounds: Normal heart sounds.  Pulmonary:     Effort: Pulmonary effort is normal.  No respiratory distress.     Breath sounds: Normal breath sounds.  Musculoskeletal:     Cervical back: Neck supple.  Skin:    General: Skin is warm and dry.  Neurological:     Mental Status: She is alert and oriented to person, place, and time.  Psychiatric:        Mood and Affect: Mood normal.        Behavior: Behavior normal.   Today's Vitals   05/22/21 1644  BP: 124/86  Pulse: 96  SpO2: 98%  Weight: 165 lb (74.8 kg)  Height: 5' 2.5" (1.588 m)   Body mass index is 29.7 kg/m.      Assessment:     Participant in health and wellness plan     Plan:     Keep all regular appts with PCP. Encouraged continued  efforts at healthy eating and exercise. Schedule mammogram. F/u here prn.

## 2021-06-01 NOTE — Progress Notes (Deleted)
Office Visit Note  Patient: Lori Cameron             Date of Birth: [birth year]           MRN: [id number]             PCP: Seward Carol, MD Referring: Seward Carol, MD Visit Date: 06/11/2021 Occupation: '@GUAROCC' @  Subjective:  No chief complaint on file.   History of Present Illness: Lori Cameron is a 45 y.o. female ***   Activities of Daily Living:  Patient reports morning stiffness for *** {minute/hour:19697}.   Patient {ACTIONS;DENIES/REPORTS:21021675::"Denies"} nocturnal pain.  Difficulty dressing/grooming: {ACTIONS;DENIES/REPORTS:21021675::"Denies"} Difficulty climbing stairs: {ACTIONS;DENIES/REPORTS:21021675::"Denies"} Difficulty getting out of chair: {ACTIONS;DENIES/REPORTS:21021675::"Denies"} Difficulty using hands for taps, buttons, cutlery, and/or writing: {ACTIONS;DENIES/REPORTS:21021675::"Denies"}  No Rheumatology ROS completed.   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   C&B/ LEEP AUGUST 2010 WITH CIN 1 AND MARGINS FREE   Hypertension    PONV (postoperative nausea and vomiting)     Family History  Problem Relation Age of Onset   Diabetes Mother    Hypertension Mother    Cancer Mother        Cervical   Healthy Sister    Healthy Sister    Healthy Brother    Healthy Brother    Hypertension Maternal Grandmother    Heart disease Maternal Grandmother    Cancer Maternal Aunt        colon    Past Surgical History:  Procedure Laterality Date   ABDOMINAL SURGERY     LAPAROTOMY APPENDECTOMY, LYSIS OF ADHESIONS   APPENDECTOMY     CERVICAL BIOPSY  W/ LOOP ELECTRODE EXCISION     CINI MARGINS FREE   DILITATION & CURRETTAGE/HYSTROSCOPY WITH HYDROTHERMAL ABLATION N/A 02/23/2020   Procedure: DILATATION & CURETTAGE/HYSTEROSCOPY WITH HYDROTHERMAL ABLATION;  Surgeon: Thurnell Lose, MD;  Location: [facility];  Service: Gynecology;  Laterality: N/A;   INTRAUTERINE DEVICE (IUD) INSERTION N/A 02/23/2020   Procedure: INTRAUTERINE DEVICE (IUD) INSERTION MARENA;  Surgeon: Thurnell Lose, MD;  Location: [facility];  Service: Gynecology;  Laterality: N/A;   TONSILLECTOMY AND ADENOIDECTOMY     Social History   Social History Narrative   Not on file    There is no immunization history on file for this patient.   Objective: Vital Signs: There were no vitals taken for this visit.   Physical Exam   Musculoskeletal Exam: ***  CDAI Exam: CDAI Score: -- Patient Global: --; Provider Global: -- Swollen: --; Tender: -- Joint Exam 06/11/2021   No joint exam has been documented for this visit   There is currently no information documented on the homunculus. Go to the Rheumatology activity and complete the homunculus joint exam.  Investigation: No additional findings.  Imaging: XR Foot 2 Views Left  Result Date: 05/14/2021 No MTP, PIP or DIP narrowing was noted.  No intertarsal, tibiotalar  or subtalar joint space narrowing was noted.  No erosive changes were noted. Impression: Unremarkable x-ray of the foot.  XR Foot 2 Views Right  Result Date: 05/14/2021 No MTP, PIP or DIP narrowing was noted.  No intertarsal, tibiotalar or subtalar joint space narrowing was noted.  No erosive changes were noted. Impression: Unremarkable x-ray of the foot.  XR Hand 2 View Left  Result Date: 05/14/2021 No MCP, PIP or DIP narrowing was noted.  No intercarpal or  radiocarpal joint space narrowing was noted.  No erosive changes were noted. Impression: Unremarkable x-ray of the hand.  XR Hand 2 View Right  Result Date: 05/14/2021 No MCP, PIP or DIP narrowing was noted.  No intercarpal or radiocarpal joint space narrowing was noted.  No erosive changes were noted. Impression: Unremarkable x-ray of the hand.  XR Pelvis 1-2 Views  Result Date: 05/14/2021 No SI joint sclerosis or narrowing was noted.  No erosive changes were noted. Impression: Unremarkable x-ray of the SI joints.   Recent Labs: Lab Results  Component Value Date   WBC 7.4 01/19/2021   HGB 14.0 01/19/2021   PLT 263 01/19/2021   NA 136 01/19/2021   K 3.6 01/19/2021   CL 100 01/19/2021   CO2 27 01/19/2021   GLUCOSE 132 (H) 01/19/2021   BUN 18 01/19/2021   CREATININE 0.81 01/19/2021   BILITOT 0.7 01/19/2021   ALKPHOS 52 12/27/2013   AST 16 01/19/2021   ALT 21 01/19/2021   PROT 6.9 01/19/2021   ALBUMIN 4.0 12/27/2013   CALCIUM 9.3 01/19/2021   GFRAA >60 02/21/2020    01/19/21: ANA-, complements WNL, dsDNA-, ribosomal P protein-, SM ab-, SM/RNP-, Ro-, La-, thyroperoxidase ab-, Scl-70-, RF-, CK 73, uric acid 3.6, ESR 2, CCP ab 6   Speciality Comments: No specialty comments available.  Procedures:  No procedures performed Allergies: Amoxicillin-pot clavulanate and Clindamycin hcl   Assessment / Plan:     Visit Diagnoses: No diagnosis found.  Orders: No orders of the defined types were placed in this encounter.  No orders of the defined types were placed in this encounter.   Face-to-face time spent with patient was *** minutes. Greater than 50% of time was spent in counseling and coordination of care.  Follow-Up Instructions: No follow-ups on file.   Bo Merino, MD  Note - This record has been created using Editor, commissioning.  Chart creation errors have been sought, but may not always  have been located. Such creation errors do not reflect on  the standard of  medical care.

## 2021-06-11 ENCOUNTER — Ambulatory Visit: Payer: PRIVATE HEALTH INSURANCE | Admitting: Rheumatology

## 2021-06-11 DIAGNOSIS — Z8709 Personal history of other diseases of the respiratory system: Secondary | ICD-10-CM

## 2021-06-11 DIAGNOSIS — Z8659 Personal history of other mental and behavioral disorders: Secondary | ICD-10-CM

## 2021-06-11 DIAGNOSIS — M79641 Pain in right hand: Secondary | ICD-10-CM

## 2021-06-11 DIAGNOSIS — M79671 Pain in right foot: Secondary | ICD-10-CM

## 2021-06-11 DIAGNOSIS — M47816 Spondylosis without myelopathy or radiculopathy, lumbar region: Secondary | ICD-10-CM

## 2021-06-11 DIAGNOSIS — Z1589 Genetic susceptibility to other disease: Secondary | ICD-10-CM

## 2021-06-11 DIAGNOSIS — M170 Bilateral primary osteoarthritis of knee: Secondary | ICD-10-CM

## 2021-06-11 DIAGNOSIS — D251 Intramural leiomyoma of uterus: Secondary | ICD-10-CM

## 2021-06-11 DIAGNOSIS — G8929 Other chronic pain: Secondary | ICD-10-CM

## 2021-10-16 ENCOUNTER — Other Ambulatory Visit: Payer: Self-pay | Admitting: Nurse Practitioner

## 2021-10-16 ENCOUNTER — Other Ambulatory Visit: Payer: Self-pay | Admitting: Obstetrics and Gynecology

## 2021-10-16 DIAGNOSIS — Z1231 Encounter for screening mammogram for malignant neoplasm of breast: Secondary | ICD-10-CM

## 2021-10-25 ENCOUNTER — Ambulatory Visit
Admission: RE | Admit: 2021-10-25 | Discharge: 2021-10-25 | Disposition: A | Discharge status: Home / Self Care | Payer: PRIVATE HEALTH INSURANCE | Source: Ambulatory Visit | Attending: Obstetrics and Gynecology | Admitting: Obstetrics and Gynecology

## 2021-10-25 DIAGNOSIS — Z1231 Encounter for screening mammogram for malignant neoplasm of breast: Secondary | ICD-10-CM

## 2021-11-24 HISTORY — PX: MEDIAL PARTIAL KNEE REPLACEMENT: SHX5965

## 2022-05-02 ENCOUNTER — Other Ambulatory Visit (HOSPITAL_COMMUNITY)
Admission: RE | Admit: 2022-05-02 | Discharge: 2022-05-02 | Disposition: A | Discharge status: Home / Self Care | Payer: Commercial Managed Care - HMO | Source: Ambulatory Visit | Attending: Nurse Practitioner | Admitting: Nurse Practitioner

## 2022-05-02 DIAGNOSIS — Z124 Encounter for screening for malignant neoplasm of cervix: Secondary | ICD-10-CM | POA: Diagnosis present

## 2022-05-02 DIAGNOSIS — Z87410 Personal history of cervical dysplasia: Secondary | ICD-10-CM | POA: Diagnosis present

## 2022-05-15 LAB — CYTOLOGY - PAP
Comment: NEGATIVE
Diagnosis: NEGATIVE
Diagnosis: REACTIVE
High risk HPV: NEGATIVE

## 2022-06-04 ENCOUNTER — Ambulatory Visit: Payer: PRIVATE HEALTH INSURANCE | Admitting: Gastroenterology

## 2023-01-02 ENCOUNTER — Ambulatory Visit: Payer: PRIVATE HEALTH INSURANCE | Admitting: Orthopedic Surgery

## 2023-01-06 ENCOUNTER — Encounter: Payer: Self-pay | Admitting: Orthopedic Surgery

## 2023-01-06 ENCOUNTER — Ambulatory Visit (INDEPENDENT_AMBULATORY_CARE_PROVIDER_SITE_OTHER): Payer: PRIVATE HEALTH INSURANCE | Admitting: Orthopedic Surgery

## 2023-01-06 ENCOUNTER — Other Ambulatory Visit (INDEPENDENT_AMBULATORY_CARE_PROVIDER_SITE_OTHER): Payer: PRIVATE HEALTH INSURANCE

## 2023-01-06 VITALS — BP 161/82 | HR 82 | Ht 64.0 in | Wt 169.8 lb

## 2023-01-06 DIAGNOSIS — M5416 Radiculopathy, lumbar region: Secondary | ICD-10-CM | POA: Diagnosis not present

## 2023-01-06 NOTE — Progress Notes (Signed)
Orthopedic Spine Surgery Office Note  Assessment: Patient is a 47 y.o. female with low back pain. Possible L5/S1 DDD or SI joint etiology    Plan: -Explained that initially conservative treatment is tried as a significant number of patients may experience relief with these treatment modalities. Discussed that the conservative treatments include:  -activity modification  -physical therapy  -over the counter pain medications  -medrol dosepak  -steroid injections -Told her to bring the MRI disc of her lumbar next time so I can look at it -Patient has tried PT, tylenol, oral steroids, lumbar steroid injections  -Recommended diagnostic/therapeutic SI joint injections  -Patient should return to office in 3 weeks, x-rays at next visit: none   Patient expressed understanding of the plan and all questions were answered to the patient's satisfaction.   ___________________________________________________________________________   History:  Patient is a 47 y.o. female who presents today for lumbar spine. Patient has had 9 months of pain that starts in her low back and radiates into her bilateral hips. It also radiated down the right lower extremity. The radiating right leg pain has since resolved. She still has significant lower back pain that radiates into her bilateral hips. No pain radiating past either hip. She feels pain on a daily basis. Pain is worse with activity and improves with rest.    Weakness: denies Symptoms of imbalance: denies Paresthesias and numbness: denies Bowel or bladder incontinence: denies Saddle anesthesia: denies  Treatments tried: PT, tylenol, oral steroids, lumbar injections  Review of systems: Denies fevers and chills, night sweats, unexplained weight loss, history of cancer. Has had pain that wakes her at night  Past medical history: Depression HTN Chronic pain  Allergies: NKDA  Past surgical history:  Tonsillectomy Right unicompartmental knee  arthroplasty   Social history: Denies use of nicotine product (smoking, vaping, patches, smokeless) Alcohol use: yes, 5 drinks per week  Denies recreational drug use   Physical Exam:  General: no acute distress, appears stated age Neurologic: alert, answering questions appropriately, following commands Respiratory: unlabored breathing on room air, symmetric chest rise Psychiatric: appropriate affect, normal cadence to speech   MSK (spine):  -Strength exam      Left  Right EHL    5/5  5/5 TA    5/5  5/5 GSC    5/5  5/5 Knee extension  5/5  5/5 Hip flexion   5/5  5/5  -Sensory exam    Sensation intact to light touch in L3-S1 nerve distributions of bilateral lower extremities  -Achilles DTR: 2/4 on the left, 2/4 on the right -Patellar tendon DTR: 2/4 on the left, 2/4 on the right  -Straight leg raise: negative bilaterally  -Femoral nerve stretch test: negative bilaterally  -Clonus: no beats bilaterally  -Left hip exam: TTP over the SI joint, negative FABER, negative stinchfield, negative gaenslens, negative SI joint compression test -Right hip exam: TTP over the SI joint, negative FABER, negative stinchfield, negative gaenslens, negative SI joint compression test  Imaging: XR of the lumbar spine from 01/06/2023 was independently reviewed and interpreted, showing disc height loss with anterior osteophyte formation at L5/S1. No other significant degenerative changes. No evidence of instability on flexion/extension views.    Patient name: Lori Cameron Patient MRN: [id number] Date of visit: 01/06/23

## 2023-01-10 ENCOUNTER — Ambulatory Visit (INDEPENDENT_AMBULATORY_CARE_PROVIDER_SITE_OTHER): Payer: PRIVATE HEALTH INSURANCE | Admitting: Sports Medicine

## 2023-01-10 ENCOUNTER — Encounter: Payer: Self-pay | Admitting: Sports Medicine

## 2023-01-10 ENCOUNTER — Other Ambulatory Visit: Payer: Self-pay

## 2023-01-10 DIAGNOSIS — M533 Sacrococcygeal disorders, not elsewhere classified: Secondary | ICD-10-CM

## 2023-01-10 DIAGNOSIS — G8929 Other chronic pain: Secondary | ICD-10-CM | POA: Diagnosis not present

## 2023-01-10 NOTE — Progress Notes (Signed)
   Procedure Note  Patient: Lori Cameron             Date of Birth: [birth year]           MRN: [id number]             Visit Date: 01/10/2023  Procedures: Visit Diagnoses:  1. Chronic right SI joint pain   2. Chronic left SI joint pain    U/S-guided SI-joint injection, Right   After discussion of risk/benefits/indications, informed verbal consent was obtained. A timeout was then performed. The patient was positioned in a prone position on exam room table with a pillow placed under the pelvis for mild hip flexion. The SI joint area was cleaned and prepped with betadine and alcohol swabs. Sterile ultrasound gel was applied and the ultrasound transducer was placed in an anatomic axial plane over the PSIS, then moved distally over the SI-joint. Using ultrasound guidance, a 22-gauge, 3.5" needle was inserted from a medial to lateral approach utilizing an in-plane approach and directed into the SI-joint. The SI-joint was then injected with a mixture of 4:1 lidocaine:depomedrol with visualization of the injectate flow into the SI-joint under ultrasound visualization. The patient tolerated the procedure well without immediate complications.  U/S-guided SI-joint injection, Left   After discussion of risk/benefits/indications, informed verbal consent was obtained. A timeout was then performed. The patient was positioned in a prone position on exam room table with a pillow placed under the pelvis for mild hip flexion. The SI joint area was cleaned and prepped with betadine and alcohol swabs. Sterile ultrasound gel was applied and the ultrasound transducer was placed in an anatomic axial plane over the PSIS, then moved distally over the SI-joint. Using ultrasound guidance, a 22-gauge, 3.5" needle was inserted from a medial to lateral approach utilizing an in-plane approach and directed into the SI-joint. The SI-joint was then injected with a mixture of 4:1 lidocaine:depomedrol with visualization of the  injectate flow into the SI-joint under ultrasound visualization. The patient tolerated the procedure well without immediate complications.  - I evaluated the patient about 10 minutes post-injection and she had very good improvement in pain and range of motion - follow-up with Dr. Christell Constant as indicated in 3 weeks; I am happy to see them as needed  Madelyn Brunner, DO Primary Care Sports Medicine Physician  [facility] - Orthoped[facility]  This note was dictated using Dragon naturally speaking software and may contain errors in syntax, spelling, or content which have not been identified prior to signing this note.

## 2023-01-27 ENCOUNTER — Ambulatory Visit (INDEPENDENT_AMBULATORY_CARE_PROVIDER_SITE_OTHER): Payer: PRIVATE HEALTH INSURANCE | Admitting: Orthopedic Surgery

## 2023-01-27 DIAGNOSIS — M533 Sacrococcygeal disorders, not elsewhere classified: Secondary | ICD-10-CM | POA: Diagnosis not present

## 2023-01-27 DIAGNOSIS — G8929 Other chronic pain: Secondary | ICD-10-CM | POA: Diagnosis not present

## 2023-01-27 NOTE — Progress Notes (Signed)
Orthopedic Spine Surgery Office Note   Assessment: Patient is a 47 y.o. female with low back pain. Had significant improvement with SI joint injections which would be consistent with SI joint as the etiology     Plan: -Patient has tried PT, tylenol, oral steroids, lumbar steroid injections  -She has had one SI joint injection with significant relief, so recommended a second diagnostic/therapeutic injection to the SI joints with Dr. Shon Baton -Discussed SI joint ablation and SI fusion as potential future treatments if she gets good relief with another injection -Patient should return to office in 4 weeks, x-rays at next visit: none     Patient expressed understanding of the plan and all questions were answered to the patient's satisfaction.    ___________________________________________________________________________     History:   Patient is a 47 y.o. female who presents today for routine follow-up of her low back pain.  After last visit, she got bilateral SI joint injections.  She says she got 95% relief with the injections.  She said she was doing amazing for about a week.  Then she noticed the pain gradually returning.  Pain has returned to baseline at this point.  She is feeling it in the bilateral posterior hips.  She notices it is worse with weightbearing and improves with rest.  She does not have any pain radiating past the posterior hips on either side.  Of note, she says she has been seen in the past by another provider who also thought that this was the SI joint.  She got an injection and felt that she got a year of relief with that injection.     Treatments tried: PT, tylenol, oral steroids, lumbar injections, SI joint injections    Physical Exam:   General: no acute distress, appears stated age Neurologic: alert, answering questions appropriately, following commands Respiratory: unlabored breathing on room air, symmetric chest rise Psychiatric: appropriate affect, normal  cadence to speech     MSK (spine):   -Strength exam                                                   Left                  Right EHL                              5/5                  5/5 TA                                 5/5                  5/5 GSC                             5/5                  5/5 Knee extension            5/5                  5/5 Hip flexion  5/5                  5/5   -Sensory exam                           Sensation intact to light touch in L3-S1 nerve distributions of bilateral lower extremities   -Achilles DTR: 2/4 on the left, 2/4 on the right -Patellar tendon DTR: 2/4 on the left, 2/4 on the right   -Straight leg raise: negative bilaterally  -Femoral nerve stretch test: negative bilaterally  -Clonus: no beats bilaterally   -Left hip exam: Positive Fortin finger test, positive Gaenslen's, positive SI joint compression test, negative FABER -Right hip exam: Positive Fortin finger test, positive Gaenslen's, positive SI joint compression test, negative FABER   Imaging: XR of the lumbar spine from 01/06/2023 was previously independently reviewed and interpreted, showing disc height loss with anterior osteophyte formation at L5/S1. No other significant degenerative changes. No evidence of instability on flexion/extension views.   MRI of the lumbar spine (on disc) from 07/06/2022 was independently reviewed and interpreted, showing DDD at L5/S1.  There is a large broad-based disc herniation at L5/S1 that appears to be contacting the right S1 nerve root.  No significant stenosis seen.     Patient name: Lori Cameron Patient MRN: [id number] Date of visit: 01/27/23

## 2023-02-05 ENCOUNTER — Ambulatory Visit: Payer: PRIVATE HEALTH INSURANCE | Admitting: Sports Medicine

## 2023-02-12 ENCOUNTER — Encounter (HOSPITAL_BASED_OUTPATIENT_CLINIC_OR_DEPARTMENT_OTHER): Payer: Self-pay | Admitting: Physical Therapy

## 2023-02-12 ENCOUNTER — Ambulatory Visit (HOSPITAL_BASED_OUTPATIENT_CLINIC_OR_DEPARTMENT_OTHER): Payer: PRIVATE HEALTH INSURANCE | Attending: Student | Admitting: Physical Therapy

## 2023-02-12 DIAGNOSIS — G8929 Other chronic pain: Secondary | ICD-10-CM | POA: Insufficient documentation

## 2023-02-12 DIAGNOSIS — M6281 Muscle weakness (generalized): Secondary | ICD-10-CM | POA: Diagnosis present

## 2023-02-12 DIAGNOSIS — M25561 Pain in right knee: Secondary | ICD-10-CM | POA: Insufficient documentation

## 2023-02-12 DIAGNOSIS — M5459 Other low back pain: Secondary | ICD-10-CM | POA: Insufficient documentation

## 2023-02-12 DIAGNOSIS — R2689 Other abnormalities of gait and mobility: Secondary | ICD-10-CM | POA: Insufficient documentation

## 2023-02-12 NOTE — Therapy (Signed)
OUTPATIENT PHYSICAL THERAPY LOWER EXTREMITY EVALUATION   Patient Name: Lori Cameron MRN: [id number] DOB:1976-01-07, 47 y.o., female Today's Date: 02/12/2023  END OF SESSION:  PT End of Session - 02/12/23 1443     Visit Number 1    Number of Visits 16    Date for PT Re-Evaluation 04/09/23    PT Start Time 1345    PT Stop Time 1428    PT Time Calculation (min) 43 min    Activity Tolerance Patient tolerated treatment well    Behavior During Therapy [facility] for tasks assessed/performed             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   C&B/ LEEP AUGUST 2010 WITH CIN 1 AND MARGINS FREE   Hypertension    PONV (postoperative nausea and vomiting)    Past Surgical History:  Procedure Laterality Date   ABDOMINAL SURGERY     LAPAROTOMY APPENDECTOMY, LYSIS OF ADHESIONS   APPENDECTOMY     CERVICAL BIOPSY  W/ LOOP ELECTRODE EXCISION     CINI MARGINS FREE   DILITATION & CURRETTAGE/HYSTROSCOPY WITH HYDROTHERMAL ABLATION N/A 02/23/2020   Procedure: DILATATION & CURETTAGE/HYSTEROSCOPY WITH HYDROTHERMAL ABLATION;  Surgeon: Geryl Rankins, MD;  Location: [facility];  Service: Gynecology;  Laterality: N/A;   INTRAUTERINE DEVICE (IUD) INSERTION N/A 02/23/2020   Procedure: INTRAUTERINE DEVICE (IUD) INSERTION MARENA;  Surgeon: Geryl Rankins, MD;  Location: [facility];  Service: Gynecology;  Laterality: N/A;   TONSILLECTOMY AND ADENOIDECTOMY     Patient Active Problem List   Diagnosis Date Noted   Primary osteoarthritis of both knees 03/13/2021   Polyarthralgia 01/19/2021   Chronic right SI joint pain 09/01/2019   Fibroid uterus 01/07/2018   Left lumbar radiculitis 07/12/2013    Depression    ASTHMA 03/09/2011    PCP: Dr Renford Dills   REFERRING PROVIDER: Eartha Inch PA   REFERRING DIAG: Right TKA (April 2024)   THERAPY DIAG:  Chronic pain of right knee  Muscle weakness (generalized)  Other abnormalities of gait and mobility  Other low back pain  Rationale for Evaluation and Treatment: Rehabilitation  ONSET DATE: April was UKA but has had knee pain for a long period of time/ Low back pain at 47 years old. Flare up was October of 2023   SUBJECTIVE:   SUBJECTIVE STATEMENT: Patient had a unicompartmental knee replacement in April 2024.  She received physical therapy for the knee.  She had improvement in range of motion but still feels like she has instability in the knee.  She feels at times when she is walking the knee feels like it will give out.  Since October of last year she has had significant pain in her SI joint/gluteal/low back area.  She will have injections tomorrow in that area.  She had an injection last month that lasted about a week.  PERTINENT HISTORY: Diabetes, depression PAIN:  Are you having pain? Yes: NPRS scale:6 /10 right now. The low back pain can reach a 10/10  Pain location: right side hurts more  Pain description: Aching Aggravating factors: Pretty much all the time Relieving factors: Extension  PRECAUTIONS: None  WEIGHT BEARING RESTRICTIONS: No  FALLS:  Has patient fallen in last 6 months? No  LIVING ENVIRONMENT: A few steps into the house  OCCUPATION:  Long term care nursing director at [employer]    Hobbies:   Member of [employer]  Would like to get back into riding her bike     PLOF: Independent  PATIENT GOALS: to have less pain   NEXT MD VISIT:   OBJECTIVE:   DIAGNOSTIC FINDINGS:  Lumbar x-ray: XR of the lumbar spine from 01/06/2023 was independently reviewed and  interpreted, showing disc height loss with anterior osteophyte formation  at L5/S1. No other significant degenerative  changes. No evidence of  instability on flexion/extension views.   PATIENT SURVEYS:  FOTO    COGNITION: Overall cognitive status: Within functional limits for tasks assessed     SENSATION: WFL  EDEMA:  No visual swelling of the knee   MUSCLE LENGTH:   POSTURE: No Significant postural limitations  PALPATION: Significant tenderness to palpation in bilateral gluteals  LOWER EXTREMITY ROM:  LUMBAR ROM:   Active  A/PROM  eval  Flexion Limited 50%  Extension Improved pain  Right lateral flexion   Left lateral flexion   Right rotation No pain  Left rotation No pain    (Blank rows = not tested)   Passive ROM Right eval Left eval  Hip flexion    Hip extension    Hip abduction    Hip adduction    Hip internal rotation    Hip external rotation    Knee flexion 125   Knee extension -3   Ankle dorsiflexion    Ankle plantarflexion    Ankle inversion    Ankle eversion     (Blank rows = not tested)  LOWER EXTREMITY MMT:  MMT Right eval Left eval  Hip flexion 17.8 22.4  Hip extension    Hip abduction 20.9 27.1  Hip adduction    Hip internal rotation    Hip external rotation    Knee flexion    Knee extension 22.9 27.2  Ankle dorsiflexion    Ankle plantarflexion    Ankle inversion    Ankle eversion     (Blank rows = not tested)  GAIT: Deficit in single leg stance tim on the right.  TODAY'S TREATMENT:                                                                                                                              DATE:   Access Code: [email or web address] URL: [email or web address] Date: 02/12/2023 Prepared by: Lorayne Bender  Exercises - Static Prone on Elbows  - 1 x daily - 7 x weekly - 3 sets - 10 reps - Theracane Over Shoulder  - 1 x daily - 7 x weekly - 3 sets - 10 reps - Supine Piriformis Stretch with Foot on Ground  - 1 x daily - 7 x weekly - 3 sets - 3 reps - 20 sec hold  hold - Seated  Knee Extension with Resistance  - 1 x daily - 7  x weekly - 3 sets - 8-20 reps - Standing March with Counter Support  - 1 x daily - 7 x weekly - 3 sets - 8-20 reps   PATIENT EDUCATION:  Education details: HEP, symptom management  Person educated: Patient Education method: Explanation, Demonstration, Tactile cues, Verbal cues, and Handouts Education comprehension: verbalized understanding, returned demonstration, verbal cues required, tactile cues required, and needs further education  HOME EXERCISE PROGRAM: Access Code: [email or web address] URL: [email or web address] Date: 02/13/2023 Prepared by: Lorayne Bender  Exercises - Static Prone on Elbows  - 1 x daily - 7 x weekly - 3 sets - 10 reps - Theracane Over Shoulder  - 1 x daily - 7 x weekly - 3 sets - 10 reps - Supine Piriformis Stretch with Foot on Ground  - 1 x daily - 7 x weekly - 3 sets - 3 reps - 20 sec hold  hold - Seated Knee Extension with Resistance  - 1 x daily - 7 x weekly - 3 sets - 8-20 reps - Standing March with Counter Support  - 1 x daily - 7 x weekly - 3 sets - 8-20 reps  ASSESSMENT:  CLINICAL IMPRESSION: Patient is a 47 year old female who presents with right knee instability following a UKA in April 2024.  She has decreased single-leg stance stability on the right side.  She has strength deficits in knee extension hip flexion and hip abduction.  She also has chronic low back pain with a recent exacerbation.  She has significant tenderness to palpation in bilateral gluteals and lower lumbar spine.  She reports improvement with extension.  She is given prone positioning to try at home to see if it will change her pain.  She would benefit from skilled therapy to improve right lower extremity stability and to develop complete pool and land program.  She is a member of [employer]. OBJECTIVE IMPAIRMENTS: Abnormal gait, decreased activity tolerance, difficulty walking, decreased ROM, decreased strength, and pain.   ACTIVITY LIMITATIONS: carrying, lifting, bending,  sitting, standing, squatting, stairs, and transfers, ambulation on level surface  PARTICIPATION LIMITATIONS: meal prep, cleaning, laundry, driving, shopping, yard work, and school  PERSONAL FACTORS: and 1-2 comorbidities: low back pain  are also affecting patient's functional outcome.   REHAB POTENTIAL: Good  CLINICAL DECISION MAKING: Evolving/moderate complexity significant lower back pain at this time   EVALUATION COMPLEXITY: Moderate   GOALS: Goals reviewed with patient? Yes  SHORT TERM GOALS: Target date: 03/12/2023   Patient will increase right single leg stance time to 20 sec without pain  Baseline: Goal status: INITIAL  2.  Patient will increase right gross LE strength by 5 lbs  Baseline:  Goal status: INITIAL  3.  Patient will be independent with HEP  Baseline:  Goal status: INITIAL  LONG TERM GOALS: Target date: 04/09/2023    Patient will stand at work for >45 minutes without increased hip and back pain  Baseline:  Goal status: INITIAL  2.  Patient will ambulate community distances without increased pain  Baseline:  Goal status: INITIAL  3.  Patient will go up/down 8 steps without increased pain and instability Baseline:  Goal status: INITIAL  4.  Patient will have a complete gym and aquatic program  Baseline:  Goal status: INITIAL    PLAN:  PT FREQUENCY: 2x/week  PT DURATION: 8 weeks  PLANNED INTERVENTIONS: Therapeutic exercises, Therapeutic activity, Neuromuscular re-education, Balance training, Gait training, Patient/Family education, Self  Care, Joint mobilization, Stair training, DME instructions, Aquatic Therapy, Dry Needling, Spinal mobilization, Cryotherapy, Moist heat, Taping, and Manual therapy  PLAN FOR NEXT SESSION: Patient responds to extension. She was given a prone lying position to try at home. If this helps consider advancing that. For the knee consider standing hip 3 way for stability. Stability work on the air-ex. Keep in mind the  back issues. Consider PPT, and bridge if the low back tolerates it. Patient is a [employer] member   Aquatic: single leg stability training; core stability training. Patient has pain with flexion but does well with extension to a point.    Dessie Coma, PT 02/12/2023, 2:45 PM

## 2023-02-13 ENCOUNTER — Other Ambulatory Visit: Payer: Self-pay

## 2023-02-13 ENCOUNTER — Encounter: Payer: Self-pay | Admitting: Sports Medicine

## 2023-02-13 ENCOUNTER — Ambulatory Visit (INDEPENDENT_AMBULATORY_CARE_PROVIDER_SITE_OTHER): Payer: PRIVATE HEALTH INSURANCE | Admitting: Sports Medicine

## 2023-02-13 ENCOUNTER — Encounter (HOSPITAL_BASED_OUTPATIENT_CLINIC_OR_DEPARTMENT_OTHER): Payer: Self-pay | Admitting: Physical Therapy

## 2023-02-13 DIAGNOSIS — M533 Sacrococcygeal disorders, not elsewhere classified: Secondary | ICD-10-CM

## 2023-02-13 DIAGNOSIS — G8929 Other chronic pain: Secondary | ICD-10-CM | POA: Diagnosis not present

## 2023-02-13 NOTE — Progress Notes (Signed)
   Procedure Note  Patient: Lori Cameron             Date of Birth: Dec 27, 1975           MRN: [id number]             Visit Date: 02/13/2023  Procedures: Visit Diagnoses:  1. Chronic left SI joint pain   2. Chronic right SI joint pain    U/S-guided SI-joint injection, Right   After discussion of risk/benefits/indications, informed verbal consent was obtained. A timeout was then performed. The patient was positioned in a prone position on exam room table with a pillow placed under the pelvis for mild hip flexion. The SI joint area was cleaned and prepped with betadine and alcohol swabs. Sterile ultrasound gel was applied and the ultrasound transducer was placed in an anatomic axial plane over the PSIS, then moved distally over the SI-joint. Using ultrasound guidance, a 22-gauge, 3.5" needle was inserted from a medial to lateral approach utilizing an in-plane approach and directed into the SI-joint. The SI-joint was then injected with a mixture of 4:1 lidocaine:depomedrol with visualization of the injectate flow into the SI-joint under ultrasound visualization. The patient tolerated the procedure well without immediate complications.   U/S-guided SI-joint injection, Left   After discussion of risk/benefits/indications, informed verbal consent was obtained. A timeout was then performed. The patient was positioned in a prone position on exam room table with a pillow placed under the pelvis for mild hip flexion. The SI joint area was cleaned and prepped with betadine and alcohol swabs. Sterile ultrasound gel was applied and the ultrasound transducer was placed in an anatomic axial plane over the PSIS, then moved distally over the SI-joint. Using ultrasound guidance, a 22-gauge, 3.5" needle was inserted from a medial to lateral approach utilizing an in-plane approach and directed into the SI-joint. The SI-joint was then injected with a mixture of 4:1 lidocaine:depomedrol with visualization of the  injectate flow into the SI-joint under ultrasound visualization. The patient tolerated the procedure well without immediate complications.  -Similarly to last injections, she did have rather good improvement in pain and motion minutes following the injection -She will follow-up with Dr. Christell Constant as indicated in 3 weeks; I am happy to see her back as needed  Madelyn Brunner, DO Primary Care Sports Medicine Physician  [facility] - Orthopedics  This note was dictated using Dragon naturally speaking software and may contain errors in syntax, spelling, or content which have not been identified prior to signing this note.

## 2023-02-17 ENCOUNTER — Encounter (HOSPITAL_BASED_OUTPATIENT_CLINIC_OR_DEPARTMENT_OTHER): Payer: Self-pay

## 2023-02-17 ENCOUNTER — Ambulatory Visit (HOSPITAL_BASED_OUTPATIENT_CLINIC_OR_DEPARTMENT_OTHER): Payer: PRIVATE HEALTH INSURANCE

## 2023-02-17 DIAGNOSIS — G8929 Other chronic pain: Secondary | ICD-10-CM

## 2023-02-17 DIAGNOSIS — M6281 Muscle weakness (generalized): Secondary | ICD-10-CM

## 2023-02-17 DIAGNOSIS — M25561 Pain in right knee: Secondary | ICD-10-CM | POA: Diagnosis not present

## 2023-02-17 DIAGNOSIS — M5459 Other low back pain: Secondary | ICD-10-CM

## 2023-02-17 DIAGNOSIS — R2689 Other abnormalities of gait and mobility: Secondary | ICD-10-CM

## 2023-02-17 NOTE — Therapy (Signed)
OUTPATIENT PHYSICAL THERAPY LOWER EXTREMITY TREATMENT   Patient Name: Lori Cameron MRN: [id number] DOB:[birth year], 47 y.o., female Today's Date: 02/17/2023  END OF SESSION:  PT End of Session - 02/17/23 0820     Visit Number 2    Number of Visits 16    Date for PT Re-Evaluation 04/09/23    PT Start Time 0808    PT Stop Time 0844    PT Time Calculation (min) 36 min    Activity Tolerance Patient tolerated treatment well    Behavior During Therapy 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   C&B/ LEEP AUGUST 2010 WITH CIN 1 AND MARGINS FREE   Hypertension    PONV (postoperative nausea and vomiting)    Past Surgical History:  Procedure Laterality Date   ABDOMINAL SURGERY     LAPAROTOMY APPENDECTOMY, LYSIS OF ADHESIONS   APPENDECTOMY     CERVICAL BIOPSY  W/ LOOP ELECTRODE EXCISION     CINI MARGINS FREE   DILITATION & CURRETTAGE/HYSTROSCOPY WITH HYDROTHERMAL ABLATION N/A 02/23/2020   Procedure: DILATATION & CURETTAGE/HYSTEROSCOPY WITH HYDROTHERMAL ABLATION;  Surgeon: Geryl Rankins, MD;  Location: [facility];  Service: Gynecology;  Laterality: N/A;   INTRAUTERINE DEVICE (IUD) INSERTION N/A 02/23/2020   Procedure: INTRAUTERINE DEVICE (IUD) INSERTION MARENA;  Surgeon: Geryl Rankins, MD;  Location: [facility];  Service: Gynecology;  Laterality: N/A;   TONSILLECTOMY AND ADENOIDECTOMY     Patient Active Problem List   Diagnosis Date Noted   Primary osteoarthritis of both knees 03/13/2021   Polyarthralgia 01/19/2021   Chronic right SI joint pain 09/01/2019   Fibroid uterus 01/07/2018   Left lumbar radiculitis 07/12/2013    Depression    ASTHMA 03/09/2011    PCP: Dr Renford Dills   REFERRING PROVIDER: Eartha Inch PA   REFERRING DIAG: Right TKA (April 2024)   THERAPY DIAG:  Chronic pain of right knee  Muscle weakness (generalized)  Other low back pain  Other abnormalities of gait and mobility  Rationale for Evaluation and Treatment: Rehabilitation  ONSET DATE: April was UKA but has had knee pain for a long period of time/ Low back pain at 47 years old. Flare up was October of 2023   SUBJECTIVE:   SUBJECTIVE STATEMENT: Pt reports her pain in low back has decreased since her injection. 2/10 LBP at entry. 6-7/10 R knee pain. R knee feels unstable an uncomfortable today.   PERTINENT HISTORY: Diabetes, depression PAIN:  Are you having pain? Yes: NPRS scale:7/10 right now. The low back pain can reach a 10/10  Pain location: L knee, 2/10 low back Pain description: Aching Aggravating factors: Pretty much all the time Relieving factors: Extension  PRECAUTIONS: None  WEIGHT BEARING RESTRICTIONS: No  FALLS:  Has patient fallen in last 6 months? 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 No other significant degenerative changes. No evidence of  instability on flexion/extension views.   PATIENT SURVEYS:  FOTO 47  COGNITION: Overall cognitive status: Within functional limits for tasks assessed     SENSATION: WFL  EDEMA:  No visual swelling of the knee   MUSCLE LENGTH:   POSTURE: No Significant postural  limitations  PALPATION: Significant tenderness to palpation in bilateral gluteals  LOWER EXTREMITY ROM:  LUMBAR ROM:   Active  A/PROM  eval  Flexion Limited 50%  Extension Improved pain  Right lateral flexion   Left lateral flexion   Right rotation No pain  Left rotation No pain    (Blank rows = not tested)   Passive ROM Right eval Left eval  Hip flexion    Hip extension    Hip abduction    Hip adduction    Hip internal rotation    Hip external rotation    Knee flexion 125   Knee extension -3   Ankle dorsiflexion    Ankle plantarflexion    Ankle inversion    Ankle eversion     (Blank rows = not tested)  LOWER EXTREMITY MMT:  MMT Right eval Left eval  Hip flexion 17.8 22.4  Hip extension    Hip abduction 20.9 27.1  Hip adduction    Hip internal rotation    Hip external rotation    Knee flexion    Knee extension 22.9 27.2  Ankle dorsiflexion    Ankle plantarflexion    Ankle inversion    Ankle eversion     (Blank rows = not tested)  GAIT: Deficit in single leg stance tim on the right.  TODAY'S TREATMENT:                                                                                                                              DATE:  6/24: DKTC stretch- 30sec x3 Piriformis stretch 30sec x2ea Supine SLR 2x10R Sidelying hip abduction 2x10ea Supine marching 2x10 Single leg heel raise 1x10 R Static lunge 2x10 R LE Step up with controlled descent- 6"  Staggered sit to stands 1x10 Review and update to HEP  PATIENT EDUCATION:  Education details: HEP, symptom management  Person educated: Patient Education method: Explanation, Demonstration, Tactile cues, Verbal cues, and Handouts Education comprehension: verbalized understanding, returned demonstration, verbal cues required, tactile cues required, and needs further education  HOME EXERCISE PROGRAM: Access Code: [email or web address] (Pt has app on phone) URL: [email or web address] Date:  02/13/2023 Prepared by: Lorayne Bender  Exercises - Static Prone on Elbows  - 1 x daily - 7 x weekly - 3 sets - 10 reps - Theracane Over Shoulder  - 1 x daily - 7 x weekly - 3 sets -  10 reps - Supine Piriformis Stretch with Foot on Ground  - 1 x daily - 7 x weekly - 3 sets - 3 reps - 20 sec hold  hold - Seated Knee Extension with Resistance  - 1 x daily - 7 x weekly - 3 sets - 8-20 reps - Standing March with Counter Support  - 1 x daily - 7 x weekly - 3 sets - 8-20 reps  ASSESSMENT:  CLINICAL IMPRESSION: Pt demosntrates significant tightness and weakness within bilateral gluteal mm. Added strengthening for hip abductors for pt to complete at home. Pt is tender to palpation of distal medial HS and distal quad. Educated pt on self massage to these areas. Good tolerance for gentle lumbar stretching and core activation. Pt is especially challenged by eccentric quad control with step ups and sit to stands. Fatigues quickly with hip 3 way on plinth. Will continue to progress with strengthening as tolerated. OBJECTIVE IMPAIRMENTS: Abnormal gait, decreased activity tolerance, difficulty walking, decreased ROM, decreased strength, and pain.   ACTIVITY LIMITATIONS: carrying, lifting, bending, sitting, standing, squatting, stairs, and transfers, ambulation on level surface  PARTICIPATION LIMITATIONS: meal prep, cleaning, laundry, driving, shopping, yard work, and school  PERSONAL FACTORS: and 1-2 comorbidities: low back pain  are also affecting patient's functional outcome.   REHAB POTENTIAL: Good  CLINICAL DECISION MAKING: Evolving/moderate complexity significant lower back pain at this time   EVALUATION COMPLEXITY: Moderate   GOALS: Goals reviewed with patient? Yes  SHORT TERM GOALS: Target date: 03/12/2023   Patient will increase right single leg stance time to 20 sec without pain  Baseline: Goal status: INITIAL  2.  Patient will increase right gross LE strength by 5 lbs  Baseline:   Goal status: INITIAL  3.  Patient will be independent with HEP  Baseline:  Goal status: INITIAL  LONG TERM GOALS: Target date: 04/09/2023    Patient will stand at work for >45 minutes without increased hip and back pain  Baseline:  Goal status: INITIAL  2.  Patient will ambulate community distances without increased pain  Baseline:  Goal status: INITIAL  3.  Patient will go up/down 8 steps without increased pain and instability Baseline:  Goal status: INITIAL  4.  Patient will have a complete gym and aquatic program  Baseline:  Goal status: INITIAL    PLAN:  PT FREQUENCY: 2x/week  PT DURATION: 8 weeks  PLANNED INTERVENTIONS: Therapeutic exercises, Therapeutic activity, Neuromuscular re-education, Balance training, Gait training, Patient/Family education, Self Care, Joint mobilization, Stair training, DME instructions, Aquatic Therapy, Dry Needling, Spinal mobilization, Cryotherapy, Moist heat, Taping, and Manual therapy  PLAN FOR NEXT SESSION: Patient responds to extension. She was given a prone lying position to try at home. If this helps consider advancing that. For the knee consider standing hip 3 way for stability. Stability work on the air-ex. Keep in mind the back issues. Consider PPT, and bridge if the low back tolerates it. Patient is a [employer] member   Aquatic: single leg stability training; core stability training. Patient has pain with flexion but does well with extension to a point.    Donnel Saxon Ziva Nunziata, PTA 02/17/2023, 10:25 AM

## 2023-02-21 ENCOUNTER — Ambulatory Visit (HOSPITAL_BASED_OUTPATIENT_CLINIC_OR_DEPARTMENT_OTHER): Payer: PRIVATE HEALTH INSURANCE

## 2023-02-21 NOTE — Therapy (Deleted)
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   C&B/ LEEP AUGUST 2010 WITH CIN 1 AND MARGINS FREE   Hypertension    PONV (postoperative nausea and vomiting)    Past Surgical History:  Procedure Laterality Date   ABDOMINAL SURGERY     LAPAROTOMY APPENDECTOMY, LYSIS OF ADHESIONS   APPENDECTOMY     CERVICAL BIOPSY  W/ LOOP ELECTRODE EXCISION     CINI MARGINS FREE   DILITATION & CURRETTAGE/HYSTROSCOPY WITH HYDROTHERMAL ABLATION N/A 02/23/2020   Procedure: DILATATION & CURETTAGE/HYSTEROSCOPY WITH HYDROTHERMAL ABLATION;  Surgeon: Geryl Rankins, MD;  Location: [facility];  Service: Gynecology;  Laterality: N/A;   INTRAUTERINE DEVICE (IUD) INSERTION N/A 02/23/2020   Procedure: INTRAUTERINE DEVICE (IUD) INSERTION MARENA;  Surgeon: Geryl Rankins, MD;  Location: [facility];  Service: Gynecology;  Laterality: N/A;   TONSILLECTOMY AND ADENOIDECTOMY     Patient Active Problem List   Diagnosis Date Noted   Primary osteoarthritis of both knees 03/13/2021   Polyarthralgia 01/19/2021   Chronic right SI joint pain 09/01/2019   Fibroid uterus 01/07/2018   Left lumbar radiculitis 07/12/2013   Depression    ASTHMA 03/09/2011    PCP: Dr Renford Dills   REFERRING PROVIDER: Eartha Inch PA   REFERRING DIAG: Right TKA (April 2024)   THERAPY DIAG:  No diagnosis found.  Rationale for Evaluation and Treatment: Rehabilitation  ONSET DATE: April was UKA but has had knee pain for a long period of time/ Low back pain  at 47 years old. Flare up was October of 2023   SUBJECTIVE:   SUBJECTIVE STATEMENT: Pt reports her pain in low back has decreased since her injection. 2/10 LBP at entry. 6-7/10 R knee pain. R knee feels unstable an uncomfortable today.   PERTINENT HISTORY: Diabetes, depression PAIN:  Are you having pain? Yes: NPRS scale:7/10 right now. The low back pain can reach a 10/10  Pain location: L knee, 2/10 low back Pain description: Aching Aggravating factors: Pretty much all the time Relieving factors: Extension  PRECAUTIONS: None  WEIGHT BEARING RESTRICTIONS: No  FALLS:  Has patient fallen in last 6 months? No  LIVING ENVIRONMENT: A few steps into the house  OCCUPATION:  Long term care nursing director at [employer]    Hobbies:   Member of [employer]  Would like to get back into riding her bike     PLOF: Independent  PATIENT GOALS: to have less pain   NEXT MD VISIT:   OBJECTIVE:   DIAGNOSTIC FINDINGS:  Lumbar x-ray: XR of the lumbar spine from 01/06/2023 was independently reviewed and  interpreted, showing disc height loss with anterior osteophyte formation  at L5/S1. No other significant degenerative changes. No evidence of  instability on flexion/extension views.   PATIENT SURVEYS:  FOTO 47  COGNITION: Overall cognitive status: Within functional limits for tasks assessed     SENSATION: WFL  EDEMA:  No visual swelling of the knee   MUSCLE LENGTH:   POSTURE: No Significant postural limitations  PALPATION:  Significant tenderness to palpation in bilateral gluteals  LOWER EXTREMITY ROM:  LUMBAR ROM:   Active  A/PROM  eval  Flexion Limited 50%  Extension Improved pain  Right lateral flexion   Left lateral flexion   Right rotation No pain  Left rotation No pain    (Blank rows = not tested)   Passive ROM Right eval Left eval  Hip flexion    Hip extension    Hip abduction    Hip adduction    Hip internal rotation    Hip external  rotation    Knee flexion 125   Knee extension -3   Ankle dorsiflexion    Ankle plantarflexion    Ankle inversion    Ankle eversion     (Blank rows = not tested)  LOWER EXTREMITY MMT:  MMT Right eval Left eval  Hip flexion 17.8 22.4  Hip extension    Hip abduction 20.9 27.1  Hip adduction    Hip internal rotation    Hip external rotation    Knee flexion    Knee extension 22.9 27.2  Ankle dorsiflexion    Ankle plantarflexion    Ankle inversion    Ankle eversion     (Blank rows = not tested)  GAIT: Deficit in single leg stance tim on the right.  TODAY'S TREATMENT:                                                                                                                              DATE:  6/24: DKTC stretch- 30sec x3 Piriformis stretch 30sec x2ea Supine SLR 2x10R Sidelying hip abduction 2x10ea Supine marching 2x10 Single leg heel raise 1x10 R Static lunge 2x10 R LE Step up with controlled descent- 6"  Staggered sit to stands 1x10 Review and update to HEP  PATIENT EDUCATION:  Education details: HEP, symptom management  Person educated: Patient Education method: Explanation, Demonstration, Tactile cues, Verbal cues, and Handouts Education comprehension: verbalized understanding, returned demonstration, verbal cues required, tactile cues required, and needs further education  HOME EXERCISE PROGRAM: Access Code: [email or web address] (Pt has app on phone) URL: [email or web address] Date: 02/13/2023 Prepared by: Lorayne Bender  Exercises - Static Prone on Elbows  - 1 x daily - 7 x weekly - 3 sets - 10 reps - Theracane Over Shoulder  - 1 x daily - 7 x weekly - 3 sets - 10 reps - Supine Piriformis Stretch with Foot on Ground  - 1 x daily - 7 x weekly - 3 sets - 3 reps - 20 sec hold  hold - Seated Knee Extension with Resistance  - 1 x daily - 7 x weekly - 3 sets - 8-20 reps - Standing March with Counter Support  - 1 x daily - 7 x weekly - 3 sets - 8-20  reps  ASSESSMENT:  CLINICAL IMPRESSION: Pt demosntrates significant tightness and weakness within bilateral gluteal mm. Added strengthening for hip abductors for pt to complete at home.  Pt is tender to palpation of distal medial HS and distal quad. Educated pt on self massage to these areas. Good tolerance for gentle lumbar stretching and core activation. Pt is especially challenged by eccentric quad control with step ups and sit to stands. Fatigues quickly with hip 3 way on plinth. Will continue to progress with strengthening as tolerated. OBJECTIVE IMPAIRMENTS: Abnormal gait, decreased activity tolerance, difficulty walking, decreased ROM, decreased strength, and pain.   ACTIVITY LIMITATIONS: carrying, lifting, bending, sitting, standing, squatting, stairs, and transfers, ambulation on level surface  PARTICIPATION LIMITATIONS: meal prep, cleaning, laundry, driving, shopping, yard work, and school  PERSONAL FACTORS: and 1-2 comorbidities: low back pain  are also affecting patient's functional outcome.   REHAB POTENTIAL: Good  CLINICAL DECISION MAKING: Evolving/moderate complexity significant lower back pain at this time   EVALUATION COMPLEXITY: Moderate   GOALS: Goals reviewed with patient? Yes  SHORT TERM GOALS: Target date: 03/12/2023   Patient will increase right single leg stance time to 20 sec without pain  Baseline: Goal status: INITIAL  2.  Patient will increase right gross LE strength by 5 lbs  Baseline:  Goal status: INITIAL  3.  Patient will be independent with HEP  Baseline:  Goal status: INITIAL  LONG TERM GOALS: Target date: 04/09/2023    Patient will stand at work for >45 minutes without increased hip and back pain  Baseline:  Goal status: INITIAL  2.  Patient will ambulate community distances without increased pain  Baseline:  Goal status: INITIAL  3.  Patient will go up/down 8 steps without increased pain and instability Baseline:  Goal status:  INITIAL  4.  Patient will have a complete gym and aquatic program  Baseline:  Goal status: INITIAL    PLAN:  PT FREQUENCY: 2x/week  PT DURATION: 8 weeks  PLANNED INTERVENTIONS: Therapeutic exercises, Therapeutic activity, Neuromuscular re-education, Balance training, Gait training, Patient/Family education, Self Care, Joint mobilization, Stair training, DME instructions, Aquatic Therapy, Dry Needling, Spinal mobilization, Cryotherapy, Moist heat, Taping, and Manual therapy  PLAN FOR NEXT SESSION: Patient responds to extension. She was given a prone lying position to try at home. If this helps consider advancing that. For the knee consider standing hip 3 way for stability. Stability work on the air-ex. Keep in mind the back issues. Consider PPT, and bridge if the low back tolerates it. Patient is a [employer]   Aquatic: single leg stability training; core stability training. Patient has pain with flexion but does well with extension to a point.    Donnel Saxon Xzavien Harada, PTA 02/21/2023, 7:45 AM

## 2023-02-25 ENCOUNTER — Ambulatory Visit (HOSPITAL_BASED_OUTPATIENT_CLINIC_OR_DEPARTMENT_OTHER): Payer: PRIVATE HEALTH INSURANCE | Attending: Student

## 2023-02-25 ENCOUNTER — Encounter (HOSPITAL_BASED_OUTPATIENT_CLINIC_OR_DEPARTMENT_OTHER): Payer: Self-pay

## 2023-02-25 DIAGNOSIS — M5459 Other low back pain: Secondary | ICD-10-CM | POA: Diagnosis present

## 2023-02-25 DIAGNOSIS — R2689 Other abnormalities of gait and mobility: Secondary | ICD-10-CM

## 2023-02-25 DIAGNOSIS — M25561 Pain in right knee: Secondary | ICD-10-CM | POA: Insufficient documentation

## 2023-02-25 DIAGNOSIS — M6281 Muscle weakness (generalized): Secondary | ICD-10-CM | POA: Diagnosis present

## 2023-02-25 DIAGNOSIS — G8929 Other chronic pain: Secondary | ICD-10-CM | POA: Diagnosis present

## 2023-02-25 NOTE — Therapy (Signed)
OUTPATIENT PHYSICAL THERAPY LOWER EXTREMITY TREATMENT   Patient Name: Lori Cameron MRN: [id number] DOB:[birth year], 47 y.o., female Today's Date: 02/25/2023  END OF SESSION:  PT End of Session - 02/25/23 0805     Visit Number 3    Number of Visits 16    Date for PT Re-Evaluation 04/09/23    PT Start Time 0803    PT Stop Time 0844    PT Time Calculation (min) 41 min    Activity Tolerance Patient tolerated treatment well    Behavior During Therapy [facility] for tasks assessed/performed               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   C&B/ LEEP AUGUST 2010 WITH CIN 1 AND MARGINS FREE   Hypertension    PONV (postoperative nausea and vomiting)    Past Surgical History:  Procedure Laterality Date   ABDOMINAL SURGERY     LAPAROTOMY APPENDECTOMY, LYSIS OF ADHESIONS   APPENDECTOMY     CERVICAL BIOPSY  W/ LOOP ELECTRODE EXCISION     CINI MARGINS FREE   DILITATION & CURRETTAGE/HYSTROSCOPY WITH HYDROTHERMAL ABLATION N/A 02/23/2020   Procedure: DILATATION & CURETTAGE/HYSTEROSCOPY WITH HYDROTHERMAL ABLATION;  Surgeon: Geryl Rankins, MD;  Location: [facility];  Service: Gynecology;  Laterality: N/A;   INTRAUTERINE DEVICE (IUD) INSERTION N/A 02/23/2020   Procedure: INTRAUTERINE DEVICE (IUD) INSERTION MARENA;  Surgeon: Geryl Rankins, MD;  Location: [facility];  Service: Gynecology;  Laterality: N/A;   TONSILLECTOMY AND ADENOIDECTOMY     Patient Active Problem List   Diagnosis Date Noted   Primary osteoarthritis of both knees 03/13/2021   Polyarthralgia 01/19/2021   Chronic right SI joint pain 09/01/2019   Fibroid uterus 01/07/2018   Left lumbar radiculitis 07/12/2013    Depression    ASTHMA 03/09/2011    PCP: Dr Renford Dills   REFERRING PROVIDER: Eartha Inch PA   REFERRING DIAG: Right TKA (April 2024)   THERAPY DIAG:  Chronic pain of right knee  Other low back pain  Muscle weakness (generalized)  Other abnormalities of gait and mobility  Rationale for Evaluation and Treatment: Rehabilitation  ONSET DATE: April was UKA but has had knee pain for a long period of time/ Low back pain at 47 years old. Flare up was October of 2023   SUBJECTIVE:   SUBJECTIVE STATEMENT: 4-5/10 low back pain at entry that started worsening yesterday. R knee still gives out on her, but she can keep herself from falling. No R knee pain at rest, but has 4-5/10 "discomfort" level with activity.   PERTINENT HISTORY: Diabetes, depression PAIN:  Are you having pain? Yes: NPRS scale:7/10 right now. The low back pain can reach a 10/10  Pain location: R knee, 2/10 low back Pain description: Aching Aggravating factors: Pretty much all the time Relieving factors: Extension  PRECAUTIONS: None  WEIGHT BEARING RESTRICTIONS: No  FALLS:  Has patient fallen in last 6 months? No  LIVING ENVIRONMENT: A few steps into the house  OCCUPATION:  Long term care nursing director at [employer]    Hobbies:   Member of [employer]  Would like to get back into riding her bike  PLOF: Independent  PATIENT GOALS: to have less pain   NEXT MD VISIT:   OBJECTIVE:   DIAGNOSTIC FINDINGS:  Lumbar x-ray: XR of the lumbar spine from 01/06/2023 was independently reviewed and  interpreted, showing disc height loss with anterior osteophyte formation  at L5/S1. No other significant degenerative changes. No evidence of  instability on flexion/extension views.   PATIENT SURVEYS:  FOTO 47  COGNITION: Overall cognitive status: Within functional limits for tasks assessed     SENSATION: WFL  EDEMA:  No visual swelling of the knee   MUSCLE LENGTH:   POSTURE:  No Significant postural limitations  PALPATION: Significant tenderness to palpation in bilateral gluteals  LOWER EXTREMITY ROM:  LUMBAR ROM:   Active  A/PROM  eval  Flexion Limited 50%  Extension Improved pain  Right lateral flexion   Left lateral flexion   Right rotation No pain  Left rotation No pain    (Blank rows = not tested)   Passive ROM Right eval Left eval  Hip flexion    Hip extension    Hip abduction    Hip adduction    Hip internal rotation    Hip external rotation    Knee flexion 125   Knee extension -3   Ankle dorsiflexion    Ankle plantarflexion    Ankle inversion    Ankle eversion     (Blank rows = not tested)  LOWER EXTREMITY MMT:  MMT Right eval Left eval  Hip flexion 17.8 22.4  Hip extension    Hip abduction 20.9 27.1  Hip adduction    Hip internal rotation    Hip external rotation    Knee flexion    Knee extension 22.9 27.2  Ankle dorsiflexion    Ankle plantarflexion    Ankle inversion    Ankle eversion     (Blank rows = not tested)  GAIT: Deficit in single leg stance time on the right.  TODAY'S TREATMENT:                                                                                                                              DATE:   7/2 DKTC stretch- 30sec x3 Piriformis stretch 30sec x3ea STM to lumbar paraspinals Supine PPT 5" 2x10 Supine SLR 2x10R Sidelying hip abduction 3x10ea Step up with controlled descent- 6" x20 Staggered sit to stands 1x10 Posterior lunge slide at rail x10R (pillowcase under L foot) Fwd eccentric slide (pillowcase under L foot) x10   6/24: DKTC stretch- 30sec x3 Piriformis stretch 30sec x2ea Supine SLR 2x10R Sidelying hip abduction 2x10ea Supine marching 2x10 Single leg heel raise 1x10 R Static lunge 2x10 R LE Step up with controlled descent- 6"  Staggered sit to stands 1x10 Review and update to HEP  PATIENT EDUCATION:  Education details: HEP, symptom management  Person educated:  Patient Education method: Explanation, Demonstration, Tactile cues, Verbal cues, and Handouts Education comprehension: verbalized understanding, returned demonstration, verbal cues required, tactile  cues required, and needs further education  HOME EXERCISE PROGRAM: Access Code: [email or web address] (Pt has app on phone) URL: [email or web address] Date: 02/13/2023 Prepared by: Lorayne Bender  Exercises - Static Prone on Elbows  - 1 x daily - 7 x weekly - 3 sets - 10 reps - Theracane Over Shoulder  - 1 x daily - 7 x weekly - 3 sets - 10 reps - Supine Piriformis Stretch with Foot on Ground  - 1 x daily - 7 x weekly - 3 sets - 3 reps - 20 sec hold  hold - Seated Knee Extension with Resistance  - 1 x daily - 7 x weekly - 3 sets - 8-20 reps - Standing March with Counter Support  - 1 x daily - 7 x weekly - 3 sets - 8-20 reps  ASSESSMENT:  CLINICAL IMPRESSION: Pt demonstrates slight improvement in R LE eccentric control compared to last visit, though still significantly challenged by closed chain exercises. Weakness remains in bilateral hip abductors, though she was able to complete increased repetitions with S/L hip abduction without compromise in form. Will plan to review gym machines pt can use at [facility] next land visit. Has aquatic PT next session.   OBJECTIVE IMPAIRMENTS: Abnormal gait, decreased activity tolerance, difficulty walking, decreased ROM, decreased strength, and pain.   ACTIVITY LIMITATIONS: carrying, lifting, bending, sitting, standing, squatting, stairs, and transfers, ambulation on level surface  PARTICIPATION LIMITATIONS: meal prep, cleaning, laundry, driving, shopping, yard work, and school  PERSONAL FACTORS: and 1-2 comorbidities: low back pain  are also affecting patient's functional outcome.   REHAB POTENTIAL: Good  CLINICAL DECISION MAKING: Evolving/moderate complexity significant lower back pain at this time   EVALUATION COMPLEXITY: Moderate   GOALS: Goals  reviewed with patient? Yes  SHORT TERM GOALS: Target date: 03/12/2023   Patient will increase right single leg stance time to 20 sec without pain  Baseline: Goal status: INITIAL  2.  Patient will increase right gross LE strength by 5 lbs  Baseline:  Goal status: INITIAL  3.  Patient will be independent with HEP  Baseline:  Goal status: INITIAL  LONG TERM GOALS: Target date: 04/09/2023    Patient will stand at work for >45 minutes without increased hip and back pain  Baseline:  Goal status: INITIAL  2.  Patient will ambulate community distances without increased pain  Baseline:  Goal status: INITIAL  3.  Patient will go up/down 8 steps without increased pain and instability Baseline:  Goal status: INITIAL  4.  Patient will have a complete gym and aquatic program  Baseline:  Goal status: INITIAL    PLAN:  PT FREQUENCY: 2x/week  PT DURATION: 8 weeks  PLANNED INTERVENTIONS: Therapeutic exercises, Therapeutic activity, Neuromuscular re-education, Balance training, Gait training, Patient/Family education, Self Care, Joint mobilization, Stair training, DME instructions, Aquatic Therapy, Dry Needling, Spinal mobilization, Cryotherapy, Moist heat, Taping, and Manual therapy  PLAN FOR NEXT SESSION: Patient responds to extension. She was given a prone lying position to try at home. If this helps consider advancing that. For the knee consider standing hip 3 way for stability. Stability work on the air-ex. Keep in mind the back issues. Consider PPT, and bridge if the low back tolerates it. Patient is a [employer]   Aquatic: single leg stability training; core stability training. Patient has pain with flexion but does well with extension to a point.    Donnel Saxon Azizi Bally, PTA 02/25/2023, 9:31 AM

## 2023-03-03 ENCOUNTER — Ambulatory Visit: Payer: PRIVATE HEALTH INSURANCE | Admitting: Orthopedic Surgery

## 2023-03-05 ENCOUNTER — Ambulatory Visit (HOSPITAL_BASED_OUTPATIENT_CLINIC_OR_DEPARTMENT_OTHER): Payer: PRIVATE HEALTH INSURANCE | Admitting: Physical Therapy

## 2023-03-05 ENCOUNTER — Encounter (HOSPITAL_BASED_OUTPATIENT_CLINIC_OR_DEPARTMENT_OTHER): Payer: Self-pay | Admitting: Physical Therapy

## 2023-03-05 DIAGNOSIS — M25561 Pain in right knee: Secondary | ICD-10-CM | POA: Diagnosis not present

## 2023-03-05 DIAGNOSIS — G8929 Other chronic pain: Secondary | ICD-10-CM

## 2023-03-05 DIAGNOSIS — M5459 Other low back pain: Secondary | ICD-10-CM

## 2023-03-05 DIAGNOSIS — M6281 Muscle weakness (generalized): Secondary | ICD-10-CM

## 2023-03-05 DIAGNOSIS — R2689 Other abnormalities of gait and mobility: Secondary | ICD-10-CM

## 2023-03-05 NOTE — Therapy (Signed)
OUTPATIENT PHYSICAL THERAPY LOWER EXTREMITY TREATMENT   Patient Name: Lori Cameron MRN: [id number] DOB:1976/08/10, 47 y.o., female Today's Date: 03/05/2023  END OF SESSION:  PT End of Session - 03/05/23 1708     Visit Number 4    Number of Visits 16    Date for PT Re-Evaluation 04/09/23    PT Start Time 1703    PT Stop Time 1745    PT Time Calculation (min) 42 min    Activity Tolerance Patient tolerated treatment well    Behavior During Therapy [facility] for tasks assessed/performed               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   C&B/ LEEP AUGUST 2010 WITH CIN 1 AND MARGINS FREE   Hypertension    PONV (postoperative nausea and vomiting)    Past Surgical History:  Procedure Laterality Date   ABDOMINAL SURGERY     LAPAROTOMY APPENDECTOMY, LYSIS OF ADHESIONS   APPENDECTOMY     CERVICAL BIOPSY  W/ LOOP ELECTRODE EXCISION     CINI MARGINS FREE   DILITATION & CURRETTAGE/HYSTROSCOPY WITH HYDROTHERMAL ABLATION N/A 02/23/2020   Procedure: DILATATION & CURETTAGE/HYSTEROSCOPY WITH HYDROTHERMAL ABLATION;  Surgeon: Geryl Rankins, MD;  Location: [facility];  Service: Gynecology;  Laterality: N/A;   INTRAUTERINE DEVICE (IUD) INSERTION N/A 02/23/2020   Procedure: INTRAUTERINE DEVICE (IUD) INSERTION MARENA;  Surgeon: Geryl Rankins, MD;  Location: [facility];  Service: Gynecology;  Laterality: N/A;   TONSILLECTOMY AND ADENOIDECTOMY     Patient Active Problem List   Diagnosis Date Noted   Primary osteoarthritis of both knees 03/13/2021   Polyarthralgia 01/19/2021   Chronic right SI joint pain 09/01/2019   Fibroid uterus 01/07/2018   Left lumbar radiculitis 07/12/2013    Depression    ASTHMA 03/09/2011    PCP: Dr Renford Dills   REFERRING PROVIDER: Eartha Inch PA   REFERRING DIAG: Right TKA (April 2024)   THERAPY DIAG:  Chronic pain of right knee  Other low back pain  Muscle weakness (generalized)  Other abnormalities of gait and mobility  Rationale for Evaluation and Treatment: Rehabilitation  ONSET DATE: April was UKA but has had knee pain for a long period of time/ Low back pain at 47 years old. Flare up was October of 2023   SUBJECTIVE:   SUBJECTIVE STATEMENT: Pt reports increased pain as she had to drive yesterday for about 1.5 hours.  4-5/10 low back pain at entry that started worsening yesterday. R knee still gives out on her, but she can keep herself from falling. No R knee pain at rest, but has 4-5/10 "discomfort" level with activity.   PERTINENT HISTORY: Diabetes, depression PAIN:  Are you having pain? Yes: NPRS scale:5/10 right now. The low back pain can reach a 8/10  Pain location: R knee, 6-7/10 low back Pain description: Aching Aggravating factors: Pretty much all the time Relieving factors: Extension  PRECAUTIONS: None  WEIGHT BEARING RESTRICTIONS: No  FALLS:  Has patient fallen in last 6 months? No  LIVING ENVIRONMENT: A few steps into the house  OCCUPATION:  Long term care nursing director at [employer]    Hobbies:   Member  of [employer]  Would like to get back into riding her bike     PLOF: Independent  PATIENT GOALS: to have less pain   NEXT MD VISIT:   OBJECTIVE:   DIAGNOSTIC FINDINGS:  Lumbar x-ray: XR of the lumbar spine from 01/06/2023 was independently reviewed and  interpreted, showing disc height loss with anterior osteophyte formation  at L5/S1. No other significant degenerative changes. No evidence of  instability on flexion/extension views.   PATIENT SURVEYS:  FOTO 47  COGNITION: Overall cognitive status: Within functional limits for tasks  assessed     SENSATION: WFL  EDEMA:  No visual swelling of the knee   MUSCLE LENGTH:   POSTURE: No Significant postural limitations  PALPATION: Significant tenderness to palpation in bilateral gluteals  LOWER EXTREMITY ROM:  LUMBAR ROM:   Active  A/PROM  eval  Flexion Limited 50%  Extension Improved pain  Right lateral flexion   Left lateral flexion   Right rotation No pain  Left rotation No pain    (Blank rows = not tested)   Passive ROM Right eval Left eval  Hip flexion    Hip extension    Hip abduction    Hip adduction    Hip internal rotation    Hip external rotation    Knee flexion 125   Knee extension -3   Ankle dorsiflexion    Ankle plantarflexion    Ankle inversion    Ankle eversion     (Blank rows = not tested)  LOWER EXTREMITY MMT:  MMT Right eval Left eval  Hip flexion 17.8 22.4  Hip extension    Hip abduction 20.9 27.1  Hip adduction    Hip internal rotation    Hip external rotation    Knee flexion    Knee extension 22.9 27.2  Ankle dorsiflexion    Ankle plantarflexion    Ankle inversion    Ankle eversion     (Blank rows = not tested)  GAIT: Deficit in single leg stance time on the right.  TODAY'S TREATMENT:                                                                                                                              DATE:    03/05/23 Pt seen for aquatic therapy today.  Treatment took place in water 3.5-4.75 ft in depth at the [facility] pool. Temp of water was 91.  Pt entered/exited the pool via stairs using step through pattern with hand rail.  *intro to setting *forward, backward and side stepping unsupported *Core engagement: yellow HB carry forward then backward x 2 widths (pt reports some LB discomfort) *L stretch x 3; L stretch with tail wagging *anterior and posterior pelvic tilts; hip hiking; pelvic rotation seated on noodle *Unilateral hb carry rainbow forward and back 2  widths *Decompression on yellow noodle  wrapped posteriorly across chest.: cycling; hip add/abd (slightly increases LBP) *Relaxed squats ue support yellow  ahnd buoy *tandem walking forward and back 3.8 ft unsupported (difficult) *SLS unsupported unsteady rle able to hold x 10s ea *TrA sets: solid noodle pull downs wide stance x 10; staggered stance   Pt requires the buoyancy and hydrostatic pressure of water for support, and to offload joints by unweighting joint load by at least 50 % in navel deep water and by at least 75-80% in chest to neck deep water.  Viscosity of the water is needed for resistance of strengthening. Water current perturbations provides challenge to standing balance requiring increased core activation.     7/2 DKTC stretch- 30sec x3 Piriformis stretch 30sec x3ea STM to lumbar paraspinals Supine PPT 5" 2x10 Supine SLR 2x10R Sidelying hip abduction 3x10ea Step up with controlled descent- 6" x20 Staggered sit to stands 1x10 Posterior lunge slide at rail x10R (pillowcase under L foot) Fwd eccentric slide (pillowcase under L foot) x10   6/24: DKTC stretch- 30sec x3 Piriformis stretch 30sec x2ea Supine SLR 2x10R Sidelying hip abduction 2x10ea Supine marching 2x10 Single leg heel raise 1x10 R Static lunge 2x10 R LE Step up with controlled descent- 6"  Staggered sit to stands 1x10 Review and update to HEP  PATIENT EDUCATION:  Education details: HEP, symptom management  Person educated: Patient Education method: Explanation, Demonstration, Tactile cues, Verbal cues, and Handouts Education comprehension: verbalized understanding, returned demonstration, verbal cues required, tactile cues required, and needs further education  HOME EXERCISE PROGRAM: Access Code: [email or web address] (Pt has app on phone) URL: [email or web address] Date: 02/13/2023 Prepared by: Lorayne Bender  Exercises - Static Prone on Elbows  - 1 x daily - 7 x weekly - 3 sets - 10  reps - Theracane Over Shoulder  - 1 x daily - 7 x weekly - 3 sets - 10 reps - Supine Piriformis Stretch with Foot on Ground  - 1 x daily - 7 x weekly - 3 sets - 3 reps - 20 sec hold  hold - Seated Knee Extension with Resistance  - 1 x daily - 7 x weekly - 3 sets - 8-20 reps - Standing March with Counter Support  - 1 x daily - 7 x weekly - 3 sets - 8-20 reps  ASSESSMENT:  CLINICAL IMPRESSION: Pt demonstrates safety and indep in setting with therapist instructing from deck.  She is directed through LB and LE stretching and strengthening as tolerated. Balance challenges to assess ability submerged. Pt maintains positioning in SLS and tandem best in 3.8-4.0 ft. Trialed pelvic ROM for LBP relief.  Does have LB discomfort with yellow HB carry and full range hip add/abd. With modifications pain is modified if not eliminated. Cues throughout session for abd bracing/core stability to Improve form/execution of exercises and balance   Goals ongoing    OBJECTIVE IMPAIRMENTS: Abnormal gait, decreased activity tolerance, difficulty walking, decreased ROM, decreased strength, and pain.   ACTIVITY LIMITATIONS: carrying, lifting, bending, sitting, standing, squatting, stairs, and transfers, ambulation on level surface  PARTICIPATION LIMITATIONS: meal prep, cleaning, laundry, driving, shopping, yard work, and school  PERSONAL FACTORS: and 1-2 comorbidities: low back pain  are also affecting patient's functional outcome.   REHAB POTENTIAL: Good  CLINICAL DECISION MAKING: Evolving/moderate complexity significant lower back pain at this time   EVALUATION COMPLEXITY: Moderate   GOALS: Goals reviewed with patient? Yes  SHORT TERM GOALS: Target date: 03/12/2023   Patient will increase right single leg stance time to 20 sec without pain  Baseline: Goal status: INITIAL  2.  Patient will increase right gross LE  strength by 5 lbs  Baseline:  Goal status: INITIAL  3.  Patient will be independent with HEP   Baseline:  Goal status: INITIAL  LONG TERM GOALS: Target date: 04/09/2023    Patient will stand at work for >45 minutes without increased hip and back pain  Baseline:  Goal status: INITIAL  2.  Patient will ambulate community distances without increased pain  Baseline:  Goal status: INITIAL  3.  Patient will go up/down 8 steps without increased pain and instability Baseline:  Goal status: INITIAL  4.  Patient will have a complete gym and aquatic program  Baseline:  Goal status: INITIAL    PLAN:  PT FREQUENCY: 2x/week  PT DURATION: 8 weeks  PLANNED INTERVENTIONS: Therapeutic exercises, Therapeutic activity, Neuromuscular re-education, Balance training, Gait training, Patient/Family education, Self Care, Joint mobilization, Stair training, DME instructions, Aquatic Therapy, Dry Needling, Spinal mobilization, Cryotherapy, Moist heat, Taping, and Manual therapy  PLAN FOR NEXT SESSION: Patient responds to extension. She was given a prone lying position to try at home. If this helps consider advancing that. For the knee consider standing hip 3 way for stability. Stability work on the air-ex. Keep in mind the back issues. Consider PPT, and bridge if the low back tolerates it. Patient is a [employer]   Aquatic: single leg stability training; core stability training. Patient has pain with flexion but does well with extension to a point.   Lori Dandy (Frankie) Ruben Cameron MPT 03/05/2023, 6:07 PM

## 2023-03-07 ENCOUNTER — Encounter (HOSPITAL_BASED_OUTPATIENT_CLINIC_OR_DEPARTMENT_OTHER): Payer: Self-pay

## 2023-03-07 ENCOUNTER — Ambulatory Visit (HOSPITAL_BASED_OUTPATIENT_CLINIC_OR_DEPARTMENT_OTHER): Payer: PRIVATE HEALTH INSURANCE

## 2023-03-07 DIAGNOSIS — M25561 Pain in right knee: Secondary | ICD-10-CM | POA: Diagnosis not present

## 2023-03-07 DIAGNOSIS — G8929 Other chronic pain: Secondary | ICD-10-CM

## 2023-03-07 DIAGNOSIS — M5459 Other low back pain: Secondary | ICD-10-CM

## 2023-03-07 DIAGNOSIS — M6281 Muscle weakness (generalized): Secondary | ICD-10-CM

## 2023-03-07 DIAGNOSIS — R2689 Other abnormalities of gait and mobility: Secondary | ICD-10-CM

## 2023-03-07 NOTE — Therapy (Signed)
OUTPATIENT PHYSICAL THERAPY LOWER EXTREMITY TREATMENT   Patient Name: Lori Cameron MRN: [id number] DOB:[birth year], 47 y.o., female Today's Date: 03/07/2023  END OF SESSION:  PT End of Session - 03/07/23 1557     Visit Number 5    Number of Visits 16    Date for PT Re-Evaluation 04/09/23    PT Start Time 1557    PT Stop Time 1658    PT Time Calculation (min) 61 min    Activity Tolerance Patient tolerated treatment well    Behavior During Therapy [facility] for tasks assessed/performed               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   C&B/ LEEP AUGUST 2010 WITH CIN 1 AND MARGINS FREE   Hypertension    PONV (postoperative nausea and vomiting)    Past Surgical History:  Procedure Laterality Date   ABDOMINAL SURGERY     LAPAROTOMY APPENDECTOMY, LYSIS OF ADHESIONS   APPENDECTOMY     CERVICAL BIOPSY  W/ LOOP ELECTRODE EXCISION     CINI MARGINS FREE   DILITATION & CURRETTAGE/HYSTROSCOPY WITH HYDROTHERMAL ABLATION N/A 02/23/2020   Procedure: DILATATION & CURETTAGE/HYSTEROSCOPY WITH HYDROTHERMAL ABLATION;  Surgeon: Geryl Rankins, MD;  Location: [facility];  Service: Gynecology;  Laterality: N/A;   INTRAUTERINE DEVICE (IUD) INSERTION N/A 02/23/2020   Procedure: INTRAUTERINE DEVICE (IUD) INSERTION MARENA;  Surgeon: Geryl Rankins, MD;  Location: [facility];  Service: Gynecology;  Laterality: N/A;   TONSILLECTOMY AND ADENOIDECTOMY     Patient Active Problem List   Diagnosis Date Noted   Primary osteoarthritis of both knees 03/13/2021   Polyarthralgia 01/19/2021   Chronic right SI joint pain 09/01/2019   Fibroid uterus 01/07/2018   Left lumbar radiculitis 07/12/2013    Depression    ASTHMA 03/09/2011    PCP: Dr Renford Dills   REFERRING PROVIDER: Eartha Inch PA   REFERRING DIAG: Right TKA (April 2024)   THERAPY DIAG:  Chronic pain of right knee  Other low back pain  Other abnormalities of gait and mobility  Muscle weakness (generalized)  Rationale for Evaluation and Treatment: Rehabilitation  ONSET DATE: April was UKA but has had knee pain for a long period of time/ Low back pain at 47 years old. Flare up was October of 2023   SUBJECTIVE:   SUBJECTIVE STATEMENT: Pt reports she is frustrated with her continued symtpms. 7/10 low back pain which she describes as "tightness". Continues to have L knee buckling, especially in the morning.   PERTINENT HISTORY: Diabetes, depression PAIN:  Are you having pain? Yes: NPRS scale:7/10 right now. The low back pain can reach a 8/10  Pain location: R knee, 0/10 R knee pain Pain description: Aching Aggravating factors: Pretty much all the time Relieving factors: Extension  PRECAUTIONS: None  WEIGHT BEARING RESTRICTIONS: No  FALLS:  Has patient fallen in last 6 months? 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 No other significant degenerative changes. No evidence of  instability on flexion/extension views.   PATIENT SURVEYS:  FOTO 47  COGNITION: Overall cognitive status: Within functional limits for tasks assessed     SENSATION: WFL  EDEMA:  No visual swelling of the knee   MUSCLE LENGTH:   POSTURE: No Significant postural  limitations  PALPATION: Significant tenderness to palpation in bilateral gluteals  LOWER EXTREMITY ROM:  LUMBAR ROM:   Active  A/PROM  eval  Flexion Limited 50%  Extension Improved pain  Right lateral flexion   Left lateral flexion   Right rotation No pain  Left rotation No pain    (Blank rows = not tested)   Passive ROM Right eval Left eval  Hip flexion    Hip extension    Hip abduction    Hip adduction    Hip internal rotation    Hip external rotation    Knee flexion 125   Knee extension -3   Ankle dorsiflexion    Ankle plantarflexion    Ankle inversion    Ankle eversion     (Blank rows = not tested)  LOWER EXTREMITY MMT:  MMT Right eval Left eval  Hip flexion 17.8 22.4  Hip extension    Hip abduction 20.9 27.1  Hip adduction    Hip internal rotation    Hip external rotation    Knee flexion    Knee extension 22.9 27.2  Ankle dorsiflexion    Ankle plantarflexion    Ankle inversion    Ankle eversion     (Blank rows = not tested)  GAIT: Deficit in single leg stance time on the right.  TODAY'S TREATMENT:                                                                                                                              DATE:     7/12  STM to lumbar paraspinals and bilateral glutes Cupping to lumbar paraspinals and QL.  Instruction in diaphragmatic breathing LTR x10 Cat/cow 3" x10ea IFC to low back in seated position x 8 minutes.  03/05/23 Pt seen for aquatic therapy today.  Treatment took place in water 3.5-4.75 ft in depth at the [facility] pool. Temp of water was 91.  Pt entered/exited the pool via stairs using step through pattern with hand rail.  *intro to setting *forward, backward and side stepping unsupported *Core engagement: yellow HB carry forward then backward x 2 widths (pt reports some LB discomfort) *L stretch x 3; L stretch with tail wagging *anterior and posterior pelvic tilts; hip hiking; pelvic rotation  seated on noodle *Unilateral hb carry rainbow forward and back 2 widths *Decompression on yellow noodle  wrapped  posteriorly across chest.: cycling; hip add/abd (slightly increases LBP) *Relaxed squats ue support yellow ahnd buoy *tandem walking forward and back 3.8 ft unsupported (difficult) *SLS unsupported unsteady rle able to hold x 10s ea *TrA sets: solid noodle pull downs wide stance x 10; staggered stance   Pt requires the buoyancy and hydrostatic pressure of water for support, and to offload joints by unweighting joint load by at least 50 % in navel deep water and by at least 75-80% in chest to neck deep water.  Viscosity of the water is needed for resistance of strengthening. Water current perturbations provides challenge to standing balance requiring increased core activation.     7/2 DKTC stretch- 30sec x3 Piriformis stretch 30sec x3ea STM to lumbar paraspinals Supine PPT 5" 2x10 Supine SLR 2x10R Sidelying hip abduction 3x10ea Step up with controlled descent- 6" x20 Staggered sit to stands 1x10 Posterior lunge slide at rail x10R (pillowcase under L foot) Fwd eccentric slide (pillowcase under L foot) x10   6/24: DKTC stretch- 30sec x3 Piriformis stretch 30sec x2ea Supine SLR 2x10R Sidelying hip abduction 2x10ea Supine marching 2x10 Single leg heel raise 1x10 R Static lunge 2x10 R LE Step up with controlled descent- 6"  Staggered sit to stands 1x10 Review and update to HEP  PATIENT EDUCATION:  Education details: HEP, symptom management  Person educated: Patient Education method: Explanation, Demonstration, Tactile cues, Verbal cues, and Handouts Education comprehension: verbalized understanding, returned demonstration, verbal cues required, tactile cues required, and needs further education  HOME EXERCISE PROGRAM: Access Code: [email or web address] (Pt has app on phone) URL: [email or web address] Date: 02/13/2023 Prepared by: Lorayne Bender  Exercises -  Static Prone on Elbows  - 1 x daily - 7 x weekly - 3 sets - 10 reps - Theracane Over Shoulder  - 1 x daily - 7 x weekly - 3 sets - 10 reps - Supine Piriformis Stretch with Foot on Ground  - 1 x daily - 7 x weekly - 3 sets - 3 reps - 20 sec hold  hold - Seated Knee Extension with Resistance  - 1 x daily - 7 x weekly - 3 sets - 8-20 reps - Standing March with Counter Support  - 1 x daily - 7 x weekly - 3 sets - 8-20 reps  ASSESSMENT:  CLINICAL IMPRESSION: Focused on lumbar complaints today. Pt demonstrates significant tightness and irritability in lumbar extensors, QL's and bilateral gluteal mm. Spent time on manual technqiues to address these issues. Trialled cupping to lumbar parapsinals and QL. She demonstrates greater tightness and sensitivty to R QL cocmpared to L. Educated pt on diaphragmatic breathing techniques and postural awareness with daily activities.  Patient demonstrated chest breathing normally and was provided with update to HEP for diaphragmatic breathing.  Reviewed HEP with patient and advised her on stretches to complete to help with back tightness and pain as she currently avoids all exercise when she is having painful episodes.  Will assess pain level next visit and progress as tolerated. Pt may be a good candidate for DN at future sessions. Trialled IFC at end of session for pt see if beneficial. Suggested she look into TENS unit for home use if helpful.  Patient will continue to benefit from aquatic therapy for strengthening with decreased loading.   OBJECTIVE IMPAIRMENTS: Abnormal gait, decreased activity tolerance, difficulty walking, decreased ROM, decreased strength, and pain.   ACTIVITY LIMITATIONS: carrying, lifting, bending, sitting, standing, squatting, stairs, and transfers, ambulation on level surface  PARTICIPATION LIMITATIONS: meal prep, cleaning, laundry,  driving, shopping, yard work, and school  PERSONAL FACTORS: and 1-2 comorbidities: low back pain  are also  affecting patient's functional outcome.   REHAB POTENTIAL: Good  CLINICAL DECISION MAKING: Evolving/moderate complexity significant lower back pain at this time   EVALUATION COMPLEXITY: Moderate   GOALS: Goals reviewed with patient? Yes  SHORT TERM GOALS: Target date: 03/12/2023   Patient will increase right single leg stance time to 20 sec without pain  Baseline: Goal status: INITIAL  2.  Patient will increase right gross LE strength by 5 lbs  Baseline:  Goal status: INITIAL  3.  Patient will be independent with HEP  Baseline:  Goal status: INITIAL  LONG TERM GOALS: Target date: 04/09/2023    Patient will stand at work for >45 minutes without increased hip and back pain  Baseline:  Goal status: INITIAL  2.  Patient will ambulate community distances without increased pain  Baseline:  Goal status: INITIAL  3.  Patient will go up/down 8 steps without increased pain and instability Baseline:  Goal status: INITIAL  4.  Patient will have a complete gym and aquatic program  Baseline:  Goal status: INITIAL    PLAN:  PT FREQUENCY: 2x/week  PT DURATION: 8 weeks  PLANNED INTERVENTIONS: Therapeutic exercises, Therapeutic activity, Neuromuscular re-education, Balance training, Gait training, Patient/Family education, Self Care, Joint mobilization, Stair training, DME instructions, Aquatic Therapy, Dry Needling, Spinal mobilization, Cryotherapy, Moist heat, Taping, and Manual therapy  PLAN FOR NEXT SESSION: Patient responds to extension. She was given a prone lying position to try at home. If this helps consider advancing that. For the knee consider standing hip 3 way for stability. Stability work on the air-ex. Keep in mind the back issues. Consider PPT, and bridge if the low back tolerates it. Patient is a [employer]   Aquatic: single leg stability training; core stability training. Patient has pain with flexion but does well with extension to a point.   Riki Altes, PTA  03/07/2023, 5:05 PM

## 2023-03-11 ENCOUNTER — Ambulatory Visit (HOSPITAL_BASED_OUTPATIENT_CLINIC_OR_DEPARTMENT_OTHER): Payer: PRIVATE HEALTH INSURANCE | Admitting: Physical Therapy

## 2023-03-11 ENCOUNTER — Encounter (HOSPITAL_BASED_OUTPATIENT_CLINIC_OR_DEPARTMENT_OTHER): Payer: Self-pay | Admitting: Physical Therapy

## 2023-03-11 DIAGNOSIS — G8929 Other chronic pain: Secondary | ICD-10-CM

## 2023-03-11 DIAGNOSIS — R2689 Other abnormalities of gait and mobility: Secondary | ICD-10-CM

## 2023-03-11 DIAGNOSIS — M25561 Pain in right knee: Secondary | ICD-10-CM | POA: Diagnosis not present

## 2023-03-11 DIAGNOSIS — M6281 Muscle weakness (generalized): Secondary | ICD-10-CM

## 2023-03-11 DIAGNOSIS — M5459 Other low back pain: Secondary | ICD-10-CM

## 2023-03-11 NOTE — Therapy (Addendum)
OUTPATIENT PHYSICAL THERAPY LOWER EXTREMITY TREATMENT PHYSICAL THERAPY DISCHARGE SUMMARY  Visits from Start of Care: 6  Current functional level related to goals / functional outcomes: unknown   Remaining deficits: unknown   Education / Equipment: Management of condition/HEP   Patient agrees to discharge. Patient goals were partially met. Patient is being discharged due to not returning since the last visit.  Addend Corrie Dandy Tomma Lightning) Hayli Milligan MPT 09/24/23 10:45 AM [facility] [address] [place], [place], [address] Phone: [phone]   Fax:  [phone]     Patient Name: Lori Cameron MRN: [id number] DOB:1975-11-03, 47 y.o., female Today's Date: 03/11/2023  END OF SESSION:  PT End of Session - 03/11/23 1757     Visit Number 6    Number of Visits 16    Date for PT Re-Evaluation 04/09/23    PT Start Time 1705    PT Stop Time 1750    PT Time Calculation (min) 45 min    Activity Tolerance Patient tolerated treatment well    Behavior During Therapy [facility] for tasks assessed/performed                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   C&B/ LEEP AUGUST 2010 WITH CIN 1 AND MARGINS FREE   Hypertension    PONV (postoperative nausea and vomiting)    Past Surgical History:  Procedure Laterality Date   ABDOMINAL SURGERY     LAPAROTOMY APPENDECTOMY, LYSIS OF ADHESIONS   APPENDECTOMY     CERVICAL BIOPSY  W/ LOOP ELECTRODE EXCISION     CINI MARGINS FREE   DILITATION & CURRETTAGE/HYSTROSCOPY WITH HYDROTHERMAL ABLATION N/A 02/23/2020   Procedure: DILATATION & CURETTAGE/HYSTEROSCOPY WITH HYDROTHERMAL ABLATION;  Surgeon: Geryl Rankins, MD;   Location: [facility];  Service: Gynecology;  Laterality: N/A;   INTRAUTERINE DEVICE (IUD) INSERTION N/A 02/23/2020   Procedure: INTRAUTERINE DEVICE (IUD) INSERTION MARENA;  Surgeon: Geryl Rankins, MD;  Location: [facility];  Service: Gynecology;  Laterality: N/A;   TONSILLECTOMY AND ADENOIDECTOMY     Patient Active Problem List   Diagnosis Date Noted   Primary osteoarthritis of both knees 03/13/2021   Polyarthralgia 01/19/2021   Chronic right SI joint pain 09/01/2019   Fibroid uterus 01/07/2018   Left lumbar radiculitis 07/12/2013   Depression    ASTHMA 03/09/2011    PCP: Dr Renford Dills   REFERRING PROVIDER: Eartha Inch PA   REFERRING DIAG: Right TKA (April 2024)   THERAPY DIAG:  Chronic pain of right knee  Other low back pain  Other abnormalities of gait and mobility  Muscle weakness (generalized)  Rationale for Evaluation and Treatment: Rehabilitation  ONSET DATE: April was [facility] but has had knee pain for a long period of time/ Low back pain at 47 years old. Flare up was October of 2023   SUBJECTIVE:   SUBJECTIVE STATEMENT: Pt reports sx relief for 2 days after last session. Also good relief after last land based session. States seems to last a few days then returns. Right hip pain seems to be bothering me a lot"  PERTINENT HISTORY: Diabetes, depression PAIN:  Are you having pain? Yes: NPRS scale:7/10 right now right  hip. The low back pain can reach a 8/10  Pain location: R knee, 0/10 R knee pain Pain description: Aching Aggravating factors: Pretty much all the time Relieving factors: Extension  PRECAUTIONS: None  WEIGHT BEARING RESTRICTIONS: No  FALLS:  Has patient fallen in last 6 months? No  LIVING ENVIRONMENT: A few steps into the house  OCCUPATION:  Long term care nursing director at [employer]    Hobbies:   Member of [employer]  Would like to get back into riding her bike     PLOF:  Independent  PATIENT GOALS: to have less pain   NEXT MD VISIT:   OBJECTIVE:   DIAGNOSTIC FINDINGS:  Lumbar x-ray: XR of the lumbar spine from 01/06/2023 was independently reviewed and  interpreted, showing disc height loss with anterior osteophyte formation  at L5/S1. No other significant degenerative changes. No evidence of  instability on flexion/extension views.   PATIENT SURVEYS:  FOTO 47  COGNITION: Overall cognitive status: Within functional limits for tasks assessed     SENSATION: WFL  EDEMA:  No visual swelling of the knee   MUSCLE LENGTH:   POSTURE: No Significant postural limitations  PALPATION: Significant tenderness to palpation in bilateral gluteals  LOWER EXTREMITY ROM:  LUMBAR ROM:   Active  A/PROM  eval  Flexion Limited 50%  Extension Improved pain  Right lateral flexion   Left lateral flexion   Right rotation No pain  Left rotation No pain    (Blank rows = not tested)   Passive ROM Right eval Left eval  Hip flexion    Hip extension    Hip abduction    Hip adduction    Hip internal rotation    Hip external rotation    Knee flexion 125   Knee extension -3   Ankle dorsiflexion    Ankle plantarflexion    Ankle inversion    Ankle eversion     (Blank rows = not tested)  LOWER EXTREMITY MMT:  MMT Right eval Left eval  Hip flexion 17.8 22.4  Hip extension    Hip abduction 20.9 27.1  Hip adduction    Hip internal rotation    Hip external rotation    Knee flexion    Knee extension 22.9 27.2  Ankle dorsiflexion    Ankle plantarflexion    Ankle inversion    Ankle eversion     (Blank rows = not tested)  GAIT: Deficit in single leg stance time on the right.  TODAY'S TREATMENT:                                                                                                                              DATE:  03/11/23  Pt seen for aquatic therapy today.  Treatment took place in water 3.5-4.75 ft in depth at the [facility] pool. Temp of water was 91.  Pt entered/exited the pool via stairs using step through pattern with hand rail.   *  forward, backward and side stepping unsupported *Core engagement: yellow HB carry forward then backward x 2 widths (pt reports some LB discomfort)  - Unilateral hb carry rainbow forward and back 2 widths *plank on 3rd step with leg lifts/glute isometric contraction end range *L stretch x 3; L stretch with tail wagging *Seated piriformis and glute stretch *seated resisted abd using Lt blue aquaband *side stepping with aquaband with slight knee bend x 4 widths 3.6 ft *hip hiking R/L bottom step x 10 *cycling with decompression on yellow noodle *QL stretch R/L in side pendulum position  *Decompression on yellow noodle  wrapped posteriorly across chest.: cycling; hip add/abd (slightly increases LBP)   *TrA sets: solid noodle pull downs wide stance x 10; staggered stance   Pt requires the buoyancy and hydrostatic pressure of water for support, and to offload joints by unweighting joint load by at least 50 % in navel deep water and by at least 75-80% in chest to neck deep water.  Viscosity of the water is needed for resistance of strengthening. Water current perturbations provides challenge to standing balance requiring increased core activation.   7/12  STM to lumbar paraspinals and bilateral glutes Cupping to lumbar paraspinals and QL.  Instruction in diaphragmatic breathing LTR x10 Cat/cow 3" x10ea IFC to low back in seated position x 8 minutes.  03/05/23 Pt seen for aquatic therapy today.  Treatment took place in water 3.5-4.75 ft in depth at the [facility] pool. Temp of water was 91.  Pt entered/exited the pool via stairs using step through pattern with hand rail.  *intro to setting *forward, backward and side stepping unsupported *Core engagement: yellow HB carry forward then backward x 2 widths (pt reports some LB discomfort) *L stretch x 3; L  stretch with tail wagging *anterior and posterior pelvic tilts; hip hiking; pelvic rotation seated on noodle *Unilateral hb carry rainbow forward and back 2 widths *Decompression on yellow noodle  wrapped posteriorly across chest.: cycling; hip add/abd (slightly increases LBP) *Relaxed squats ue support yellow ahnd buoy *tandem walking forward and back 3.8 ft unsupported (difficult) *SLS unsupported unsteady rle able to hold x 10s ea *TrA sets: solid noodle pull downs wide stance x 10; staggered stance   Pt requires the buoyancy and hydrostatic pressure of water for support, and to offload joints by unweighting joint load by at least 50 % in navel deep water and by at least 75-80% in chest to neck deep water.  Viscosity of the water is needed for resistance of strengthening. Water current perturbations provides challenge to standing balance requiring increased core activation.     7/2 DKTC stretch- 30sec x3 Piriformis stretch 30sec x3ea STM to lumbar paraspinals Supine PPT 5" 2x10 Supine SLR 2x10R Sidelying hip abduction 3x10ea Step up with controlled descent- 6" x20 Staggered sit to stands 1x10 Posterior lunge slide at rail x10R (pillowcase under L foot) Fwd eccentric slide (pillowcase under L foot) x10   6/24: DKTC stretch- 30sec x3 Piriformis stretch 30sec x2ea Supine SLR 2x10R Sidelying hip abduction 2x10ea Supine marching 2x10 Single leg heel raise 1x10 R Static lunge 2x10 R LE Step up with controlled descent- 6"  Staggered sit to stands 1x10 Review and update to HEP  PATIENT EDUCATION:  Education details: HEP, symptom management  Person educated: Patient Education method: Explanation, Demonstration, Tactile cues, Verbal cues, and Handouts Education comprehension: verbalized understanding, returned demonstration, verbal cues required, tactile cues required, and needs further education  HOME EXERCISE PROGRAM: Access Code: [email or web address] (Pt has  app on phone) URL:  [email or web address] Date: 02/13/2023 Prepared by: Lorayne Bender  Exercises - Static Prone on Elbows  - 1 x daily - 7 x weekly - 3 sets - 10 reps - Theracane Over Shoulder  - 1 x daily - 7 x weekly - 3 sets - 10 reps - Supine Piriformis Stretch with Foot on Ground  - 1 x daily - 7 x weekly - 3 sets - 3 reps - 20 sec hold  hold - Seated Knee Extension with Resistance  - 1 x daily - 7 x weekly - 3 sets - 8-20 reps - Standing March with Counter Support  - 1 x daily - 7 x weekly - 3 sets - 8-20 reps  ASSESSMENT:  CLINICAL IMPRESSION: Pt with complaints of right hip/glute pain which has been getting more sensitive.  Focused on stretching and strengthening area. Stretching in slight lumbar extension as well as flex. She is directed in using ball for pressure relief on wall.  She is able to gain side pendulum positioning for intensive QL stretch. Upon completion pt report hip pain 01/0 (throbbing has stopped). Pt reports compliance with HEP.  Goals ongoing     OBJECTIVE IMPAIRMENTS: Abnormal gait, decreased activity tolerance, difficulty walking, decreased ROM, decreased strength, and pain.   ACTIVITY LIMITATIONS: carrying, lifting, bending, sitting, standing, squatting, stairs, and transfers, ambulation on level surface  PARTICIPATION LIMITATIONS: meal prep, cleaning, laundry, driving, shopping, yard work, and school  PERSONAL FACTORS: and 1-2 comorbidities: low back pain  are also affecting patient's functional outcome.   REHAB POTENTIAL: Good  CLINICAL DECISION MAKING: Evolving/moderate complexity significant lower back pain at this time   EVALUATION COMPLEXITY: Moderate   GOALS: Goals reviewed with patient? Yes  SHORT TERM GOALS: Target date: 03/12/2023   Patient will increase right single leg stance time to 20 sec without pain  Baseline: Goal status: INITIAL  2.  Patient will increase right gross LE strength by 5 lbs  Baseline:  Goal status: INITIAL  3.   Patient will be independent with HEP  Baseline:  Goal status: Met 03/11/23  LONG TERM GOALS: Target date: 04/09/2023    Patient will stand at work for >45 minutes without increased hip and back pain  Baseline:  Goal status: INITIAL  2.  Patient will ambulate community distances without increased pain  Baseline:  Goal status: INITIAL  3.  Patient will go up/down 8 steps without increased pain and instability Baseline:  Goal status: INITIAL  4.  Patient will have a complete gym and aquatic program  Baseline:  Goal status: INITIAL    PLAN:  PT FREQUENCY: 2x/week  PT DURATION: 8 weeks  PLANNED INTERVENTIONS: Therapeutic exercises, Therapeutic activity, Neuromuscular re-education, Balance training, Gait training, Patient/Family education, Self Care, Joint mobilization, Stair training, DME instructions, Aquatic Therapy, Dry Needling, Spinal mobilization, Cryotherapy, Moist heat, Taping, and Manual therapy  PLAN FOR NEXT SESSION: Patient responds to extension. She was given a prone lying position to try at home. If this helps consider advancing that. For the knee consider standing hip 3 way for stability. Stability work on the air-ex. Keep in mind the back issues. Consider PPT, and bridge if the low back tolerates it. Patient is a [employer] well member   Aquatic: single leg stability training; core stability training. Patient has pain with flexion but does well with extension to a point.   Varetta Chavers Tomma Lightning) Katianne Barre MPT  03/11/2023, 5:58 PM

## 2023-03-12 ENCOUNTER — Ambulatory Visit: Payer: PRIVATE HEALTH INSURANCE | Admitting: Orthopedic Surgery

## 2023-03-12 DIAGNOSIS — M533 Sacrococcygeal disorders, not elsewhere classified: Secondary | ICD-10-CM | POA: Diagnosis not present

## 2023-03-12 NOTE — Progress Notes (Signed)
Orthopedic Spine Surgery Office Note   Assessment: Patient is a 47 y.o. female with low back pain. Had significant improvement with SI joint injections which would be consistent with SI joint as the etiology     Plan: -Patient has tried PT, tylenol, oral steroids, lumbar steroid injections, SI joint injections -Went over the fact that patient has had 2 positive responses to injections with over 90% relief and has 3 physical exam findings of SI joint pathology, so per NASS criteria, she would be a candidate for SI fusion -I discussed SI ablation as another treatment option for her that would be nonoperative.  She wants to avoid surgery so she wants to explore this as an option.  Referral was provided to Dr. [place] office today -Encouraged her to learn the physical therapy sessions and do them at home regularly as well -Patient should return to office on an as-needed basis     Patient expressed understanding of the plan and all questions were answered to the patient's satisfaction.    ___________________________________________________________________________     History:   Patient is a 47 y.o. female who presents today for routine follow-up of her low back pain.  Patient has been previously seen in the office for this low back pain.  She has now gotten to SI joint injections with Dr. Shon Baton.  She got 95% relief with the first injections and got 90% relief with the last injections.  Both sets of injections only gave her about a week of relief.  Then pain returned.  She feels the pain in the lower back along the lateral aspects.  She notices worse with activity and weightbearing.  It does get better with rest.  She has no pain radiating into the lower extremities from the lower back.   Treatments tried: PT, tylenol, oral steroids, lumbar injections, SI joint injections     Physical Exam:   General: no acute distress, appears stated age Neurologic: alert, answering questions appropriately,  following commands Respiratory: unlabored breathing on room air, symmetric chest rise Psychiatric: appropriate affect, normal cadence to speech     MSK (spine):   -Strength exam                                                   Left                  Right EHL                              5/5                  5/5 TA                                 5/5                  5/5 GSC                             5/5                  5/5 Knee extension            5/5  5/5 Hip flexion                    5/5                  5/5   -Sensory exam                           Sensation intact to light touch in L3-S1 nerve distributions of bilateral lower extremities    -Left hip exam: Positive Fortin finger test, positive Gaenslen's, positive FABER, negative SI joint compression test -Right hip exam: Positive Fortin finger test, positive Gaenslen's, positive FABER, negative SI joint compression test   Imaging: XR of the lumbar spine from 01/06/2023 was previously independently reviewed and interpreted, showing disc height loss with anterior osteophyte formation at L5/S1. No other significant degenerative changes. No evidence of instability on flexion/extension views.    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  There is a large broad-based disc herniation at L5/S1 that appears to be contacting the right S1 nerve root.  No significant stenosis seen.     Patient name: Lori Cameron Patient MRN: [id number] Date of visit: 03/12/23

## 2023-03-13 ENCOUNTER — Ambulatory Visit (HOSPITAL_BASED_OUTPATIENT_CLINIC_OR_DEPARTMENT_OTHER): Payer: PRIVATE HEALTH INSURANCE

## 2023-03-14 ENCOUNTER — Encounter (HOSPITAL_BASED_OUTPATIENT_CLINIC_OR_DEPARTMENT_OTHER): Payer: PRIVATE HEALTH INSURANCE | Admitting: Physical Therapy

## 2023-03-18 ENCOUNTER — Ambulatory Visit (HOSPITAL_BASED_OUTPATIENT_CLINIC_OR_DEPARTMENT_OTHER): Payer: PRIVATE HEALTH INSURANCE | Admitting: Physical Therapy

## 2023-03-21 ENCOUNTER — Ambulatory Visit (HOSPITAL_BASED_OUTPATIENT_CLINIC_OR_DEPARTMENT_OTHER): Payer: PRIVATE HEALTH INSURANCE | Admitting: Physical Therapy

## 2023-03-25 ENCOUNTER — Ambulatory Visit: Payer: PRIVATE HEALTH INSURANCE | Admitting: Family

## 2023-03-25 ENCOUNTER — Ambulatory Visit (HOSPITAL_BASED_OUTPATIENT_CLINIC_OR_DEPARTMENT_OTHER): Payer: PRIVATE HEALTH INSURANCE | Admitting: Physical Therapy

## 2023-03-25 ENCOUNTER — Encounter: Payer: Self-pay | Admitting: Family

## 2023-03-25 VITALS — BP 122/78 | HR 73 | Temp 97.6°F | Resp 18 | Ht 62.0 in | Wt 166.0 lb

## 2023-03-25 DIAGNOSIS — Z789 Other specified health status: Secondary | ICD-10-CM

## 2023-03-25 NOTE — Progress Notes (Signed)
  Subjective:     Patient ID: Lori Cameron, female   DOB: 11/14/1975, 47 y.o.   MRN: [id number]  HPI wellness exam for insurance   Review of Systems  All other systems reviewed and are negative.      Objective:   Physical Exam Constitutional:      Appearance: Normal appearance.  HENT:     Head: Normocephalic.  Eyes:     Pupils: Pupils are equal, round, and reactive to light.  Cardiovascular:     Rate and Rhythm: Normal rate and regular rhythm.     Pulses: Normal pulses.  Musculoskeletal:        General: Normal range of motion.     Cervical back: Normal range of motion.  Skin:    General: Skin is warm and dry.  Neurological:     General: No focal deficit present.     Mental Status: She is alert.  Psychiatric:        Mood and Affect: Mood normal.        Behavior: Behavior normal.        Assessment:     Sees PCP on regular basis.  Has colonoscopy planned for Aug 2024. Needs mammogram this year, in 2024. UTD with vaccines.    Plan:     RTC in 1 week for fasting labs.

## 2023-03-28 ENCOUNTER — Ambulatory Visit (HOSPITAL_BASED_OUTPATIENT_CLINIC_OR_DEPARTMENT_OTHER): Payer: PRIVATE HEALTH INSURANCE | Attending: Student | Admitting: Physical Therapy

## 2023-03-28 DIAGNOSIS — M25561 Pain in right knee: Secondary | ICD-10-CM | POA: Insufficient documentation

## 2023-03-28 DIAGNOSIS — G8929 Other chronic pain: Secondary | ICD-10-CM | POA: Insufficient documentation

## 2023-03-28 DIAGNOSIS — R2689 Other abnormalities of gait and mobility: Secondary | ICD-10-CM | POA: Insufficient documentation

## 2023-03-28 DIAGNOSIS — M6281 Muscle weakness (generalized): Secondary | ICD-10-CM | POA: Insufficient documentation

## 2023-03-28 DIAGNOSIS — M5459 Other low back pain: Secondary | ICD-10-CM | POA: Insufficient documentation

## 2023-04-01 ENCOUNTER — Other Ambulatory Visit: Payer: PRIVATE HEALTH INSURANCE | Admitting: Nurse Practitioner

## 2023-04-01 LAB — CBC WITH DIFFERENTIAL/PLATELET
Absolute Monocytes: 360 cells/uL (ref 200–950)
Basophils Absolute: 41 cells/uL (ref 0–200)
Basophils Relative: 0.7 %
Eosinophils Absolute: 110 cells/uL (ref 15–500)
Eosinophils Relative: 1.9 %
HCT: 42.7 % (ref 35.0–45.0)
Hemoglobin: 14.2 g/dL (ref 11.7–15.5)
Lymphs Abs: 1578 cells/uL (ref 850–3900)
MCH: 30.8 pg (ref 27.0–33.0)
MCHC: 33.3 g/dL (ref 32.0–36.0)
MCV: 92.6 fL (ref 80.0–100.0)
MPV: 9.4 fL (ref 7.5–12.5)
Monocytes Relative: 6.2 %
Neutro Abs: 3712 cells/uL (ref 1500–7800)
Neutrophils Relative %: 64 %
Platelets: 237 10*3/uL (ref 140–400)
RBC: 4.61 10*6/uL (ref 3.80–5.10)
RDW: 12.2 % (ref 11.0–15.0)
Total Lymphocyte: 27.2 %
WBC: 5.8 10*3/uL (ref 3.8–10.8)

## 2023-04-01 NOTE — Progress Notes (Signed)
Fasting labs today, tolerated well.

## 2023-07-14 ENCOUNTER — Other Ambulatory Visit: Payer: Self-pay | Admitting: Nurse Practitioner

## 2023-07-14 DIAGNOSIS — Z1231 Encounter for screening mammogram for malignant neoplasm of breast: Secondary | ICD-10-CM

## 2023-08-14 ENCOUNTER — Ambulatory Visit
Admission: RE | Admit: 2023-08-14 | Discharge: 2023-08-14 | Disposition: A | Discharge status: Home / Self Care | Payer: PRIVATE HEALTH INSURANCE | Source: Ambulatory Visit | Attending: Nurse Practitioner | Admitting: Nurse Practitioner

## 2023-08-14 DIAGNOSIS — Z1231 Encounter for screening mammogram for malignant neoplasm of breast: Secondary | ICD-10-CM

## 2023-08-18 ENCOUNTER — Ambulatory Visit (INDEPENDENT_AMBULATORY_CARE_PROVIDER_SITE_OTHER): Payer: PRIVATE HEALTH INSURANCE | Admitting: Orthopedic Surgery

## 2023-08-18 DIAGNOSIS — M533 Sacrococcygeal disorders, not elsewhere classified: Secondary | ICD-10-CM | POA: Diagnosis not present

## 2023-08-18 MED ORDER — METHYLPREDNISOLONE 4 MG PO TBPK: ORAL_TABLET | ORAL | 0 refills | Status: DC

## 2023-08-18 NOTE — Progress Notes (Signed)
Orthopedic Spine Surgery Office Note   Assessment: Patient is a 47 y.o. female with low back pain. Pain has returned. Has responded well to SI joint injections in the past     Plan: -Patient has tried PT, tylenol, oral steroids, lumbar steroid injections, SI joint injections -Has had 2 positive injections (>80%) to her SI joints. Since she has gotten several months of relief with that, recommended repeat injections -Prescribed a medrol dose pak to help with her acute worsening of pain. Told her she should not use NSAIDs while taking this medication. She can resume when she is done with the prednisone -If her pain becomes refractory to conservative treatments, I told her that her option would be SI fusion -Patient should return to office on an as-needed basis; x-rays if she comes back: low-set AP pelvis     Patient expressed understanding of the plan and all questions were answered to the patient's satisfaction.    ___________________________________________________________________________     History:   Patient is a 47 y.o. female who presents today with acute worsening of her chronic low back pain. She states that she has had bad pain in her low back for the last month. She was helping take care of her godmother who ended up passing away. During that time, she was doing a lot of manual tasks to help her. She noted her pain acutely worsened during that time. She does not recall any specific injury. She had pain initially radiating down the left thigh, but that has resolved. She now has pain in her lower back bilaterally near the SI joints. No pain radiating into either lower extremity.    Treatments tried: PT, tylenol, oral steroids, lumbar injections, SI joint injections     Physical Exam:   General: no acute distress, appears stated age Neurologic: alert, answering questions appropriately, following commands Respiratory: unlabored breathing on room air, symmetric chest  rise Psychiatric: appropriate affect, normal cadence to speech     MSK (spine):   -Strength exam                                                   Left                  Right EHL                              5/5                  5/5 TA                                 5/5                  5/5 GSC                             5/5                  5/5 Knee extension            5/5                  5/5 Hip flexion  5/5                  5/5   -Sensory exam                           Sensation intact to light touch in L3-S1 nerve distributions of bilateral lower extremities     -Left hip exam: Positive Fortin finger test, negative Gaenslen's, positive FABER, positive SI joint compression test -Right hip exam: Positive Fortin finger test, negative Gaenslen's, positive FABER, positive SI joint compression test   Imaging: XR of the lumbar spine from 01/06/2023 was previously independently reviewed and interpreted, showing disc height loss with anterior osteophyte formation at L5/S1. No other significant degenerative changes. No evidence of instability on flexion/extension views.    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  There is a large broad-based disc herniation at L5/S1 that appears to be contacting the right S1 nerve root.  No significant stenosis seen.     Patient name: Lori Cameron Patient MRN: [id number] Date of visit: 08/18/23

## 2023-09-02 ENCOUNTER — Ambulatory Visit (INDEPENDENT_AMBULATORY_CARE_PROVIDER_SITE_OTHER): Payer: PRIVATE HEALTH INSURANCE | Admitting: Sports Medicine

## 2023-09-02 ENCOUNTER — Other Ambulatory Visit: Payer: Self-pay

## 2023-09-02 ENCOUNTER — Encounter: Payer: Self-pay | Admitting: Sports Medicine

## 2023-09-02 DIAGNOSIS — M533 Sacrococcygeal disorders, not elsewhere classified: Secondary | ICD-10-CM | POA: Diagnosis not present

## 2023-09-02 DIAGNOSIS — G8929 Other chronic pain: Secondary | ICD-10-CM

## 2023-09-02 DIAGNOSIS — M7918 Myalgia, other site: Secondary | ICD-10-CM

## 2023-09-02 NOTE — Progress Notes (Signed)
 Patient was instructed in 10 minutes of therapeutic exercises for bilateral SI joints to improve strength, ROM and function according to my instructions and plan of care by a Certified Athletic Trainer during the office visit. A customized handout was provided and demonstration of proper technique shown and discussed. Patient did perform exercises and demonstrate understanding through teachback.  All questions discussed and answered.

## 2023-09-02 NOTE — Progress Notes (Signed)
   Procedure Note  Patient: Lori Cameron             Date of Birth: 1976/02/08           MRN: [id number]             Visit Date: 09/02/2023  Procedures: Visit Diagnoses:  1. SI (sacroiliac) joint dysfunction   2. Chronic left SI joint pain   3. Chronic right SI joint pain   4. Bilateral buttock pain    U/S-guided SI-joint injection, Right   After discussion of risk/benefits/indications, informed verbal consent was obtained. A timeout was then performed. The patient was positioned in a prone position on exam room table with a pillow placed under the pelvis for mild hip flexion. The SI joint area was cleaned and prepped with betadine  and alcohol swabs . Sterile ultrasound gel was applied and the ultrasound transducer was placed in an anatomic axial plane over the PSIS, then moved distally over the SI-joint. Using ultrasound guidance, a 22-gauge, 3.5 needle was inserted from a medial to lateral approach utilizing an in-plane approach and directed into the SI-joint. The SI-joint was then injected with a mixture of 4:1 lidocaine :depomedrol with visualization of the injectate flow into the SI-joint under ultrasound visualization. The patient tolerated the procedure well without immediate complications.   U/S-guided SI-joint injection, Left   After discussion of risk/benefits/indications, informed verbal consent was obtained. A timeout was then performed. The patient was positioned in a prone position on exam room table with a pillow placed under the pelvis for mild hip flexion. The SI joint area was cleaned and prepped with betadine  and alcohol swabs . Sterile ultrasound gel was applied and the ultrasound transducer was placed in an anatomic axial plane over the PSIS, then moved distally over the SI-joint. Using ultrasound guidance, a 22-gauge, 3.5 needle was inserted from a medial to lateral approach utilizing an in-plane approach and directed into the SI-joint. The SI-joint was then injected with  a mixture of 4:1 lidocaine :depomedrol with visualization of the injectate flow into the SI-joint under ultrasound visualization. The patient tolerated the procedure well without immediate complications.  - I evaluated the patient about 10 minutes post-injection and she had good improvement in pain and range of motion -She was interested in form of home therapy/PT we did print out a customized handout for SI joint stabilization and strengthening and my athletic trainer, Jinnie, did review these in the room with her today.  She is to perform these once daily. - follow-up with Dr. Georgina as indicated (PRN per his note); I am happy to see them as needed  Lonell Sprang, DO Primary Care Sports Medicine Physician  [facility]  This note was dictated using Dragon naturally speaking software and may contain errors in syntax, spelling, or content which have not been identified prior to signing this note.

## 2023-09-09 IMAGING — MG MM DIGITAL SCREENING BILAT W/ TOMO AND CAD
8 series · 8 of 24 positions shown · non-contrast
Comparison: Previous exam(s).

CLINICAL DATA: Screening.

EXAM:
DIGITAL SCREENING BILATERAL MAMMOGRAM WITH TOMOSYNTHESIS AND CAD
TECHNIQUE: Bilateral screening digital craniocaudal and mediolateral oblique
mammograms were obtained. Bilateral screening digital breast
tomosynthesis was performed. The images were evaluated with
computer-aided detection.

[L CC synth-2D]
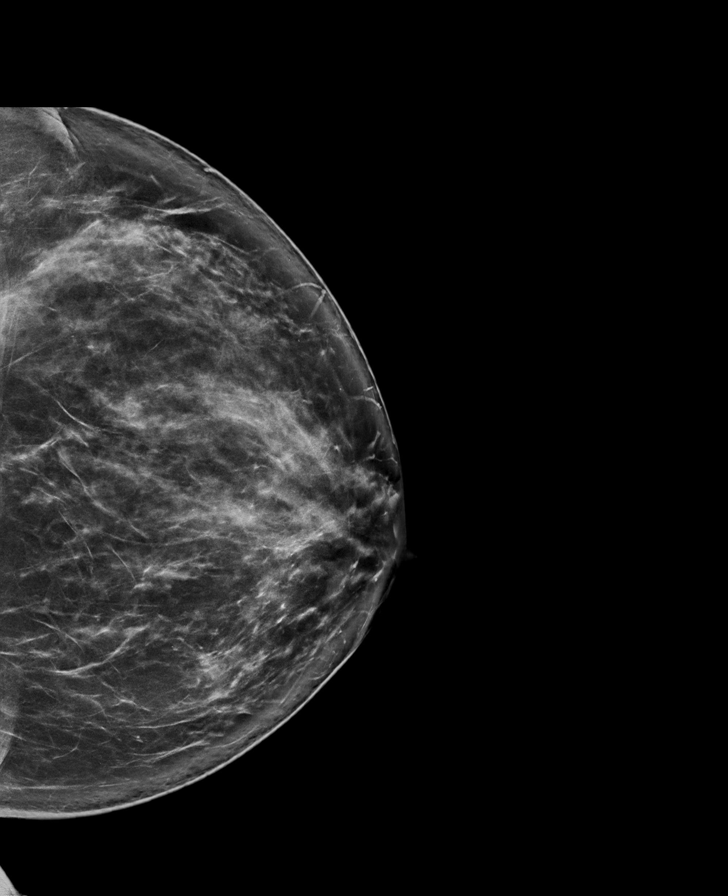

[R CC synth-2D]
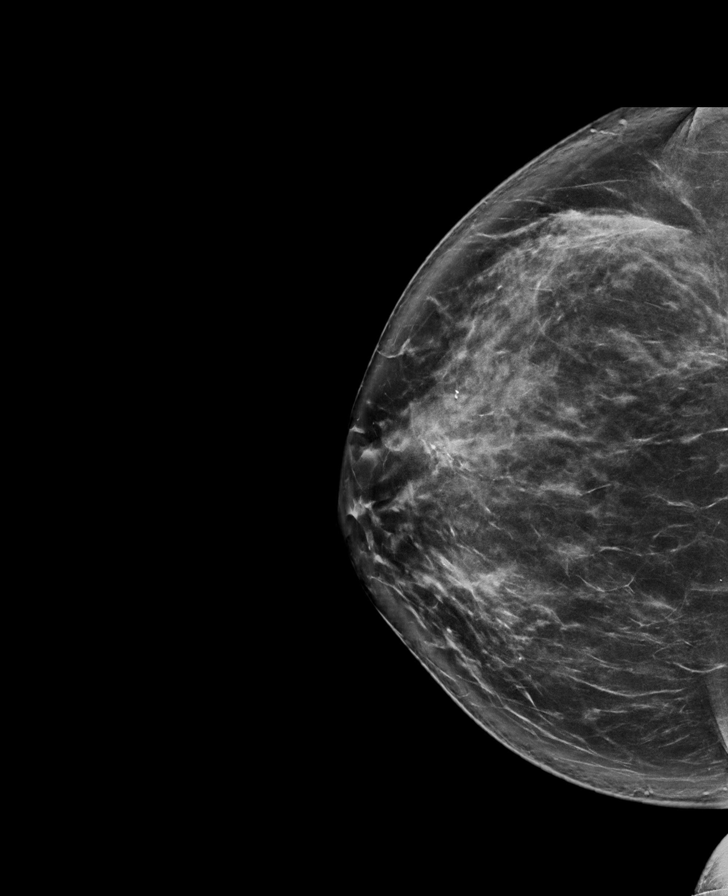

[R MLO synth-2D]
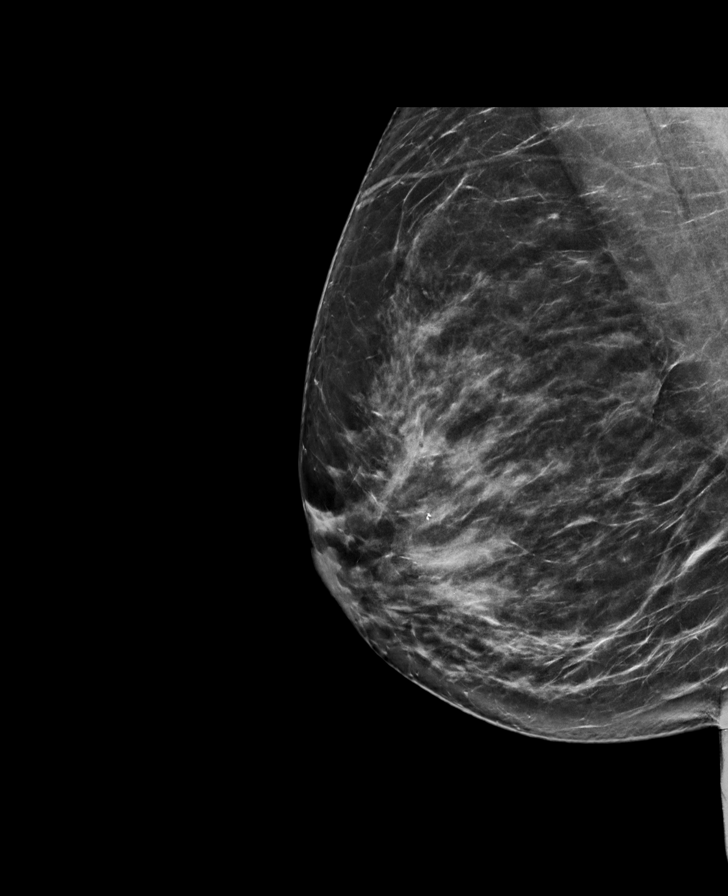

[L MLO synth-2D]
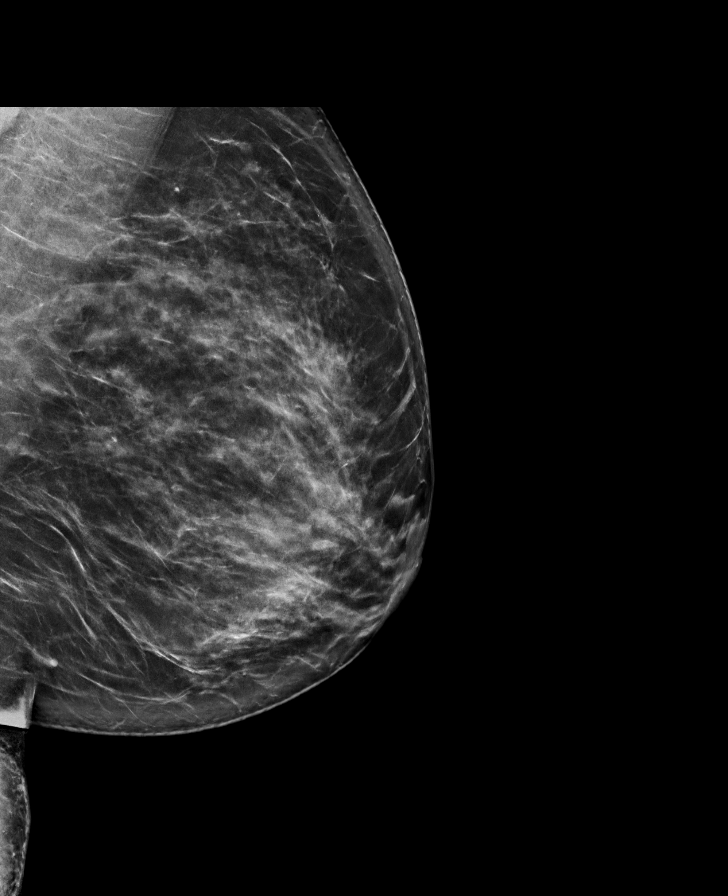

[L CC tomo · tomo slice 49/96.0]
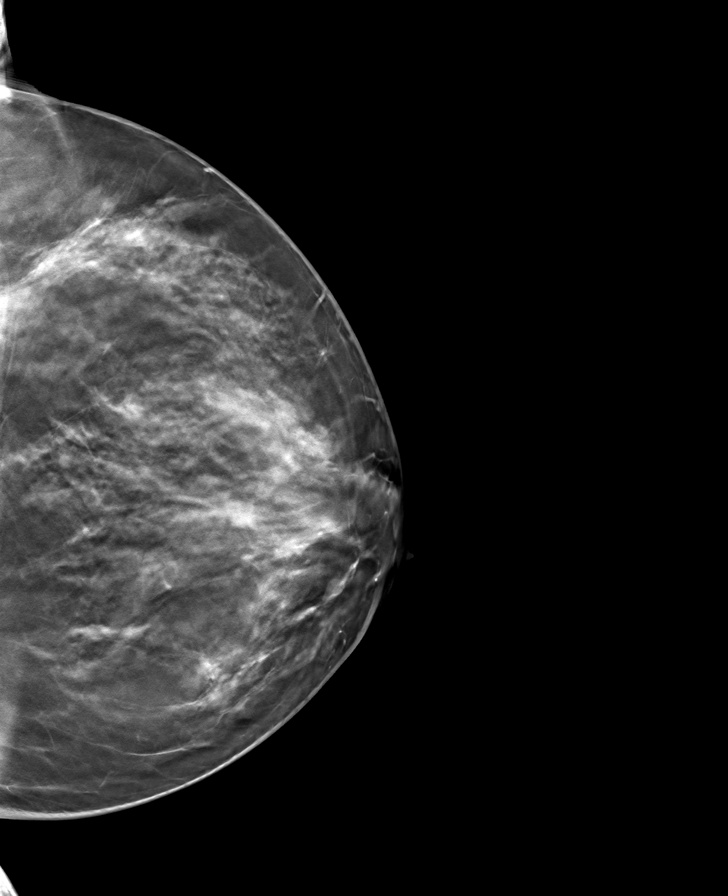

[R MLO tomo · tomo slice 45/90.0]
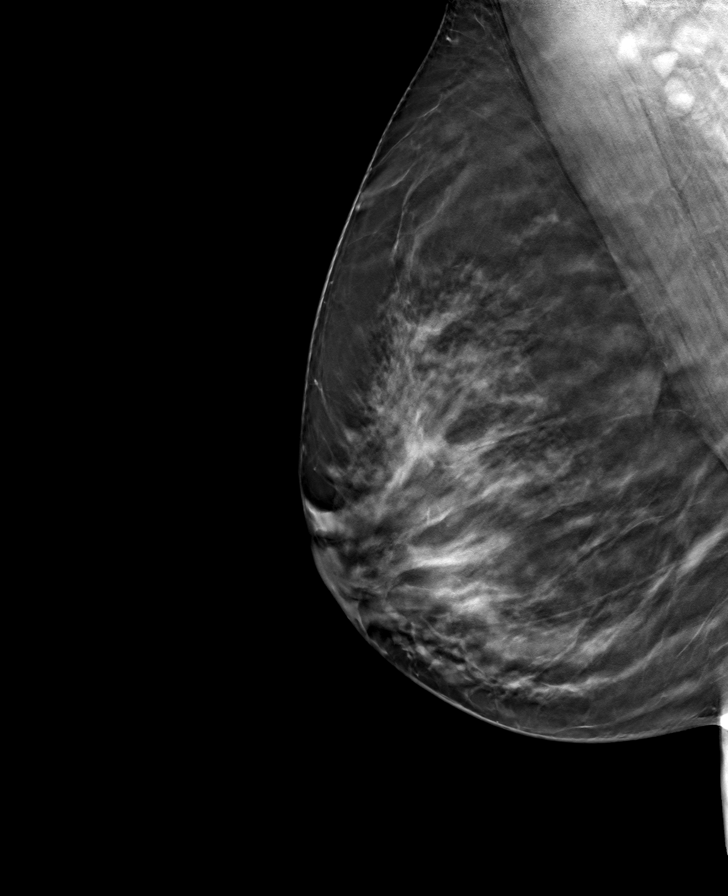

[R CC tomo · tomo slice 49/96.0]
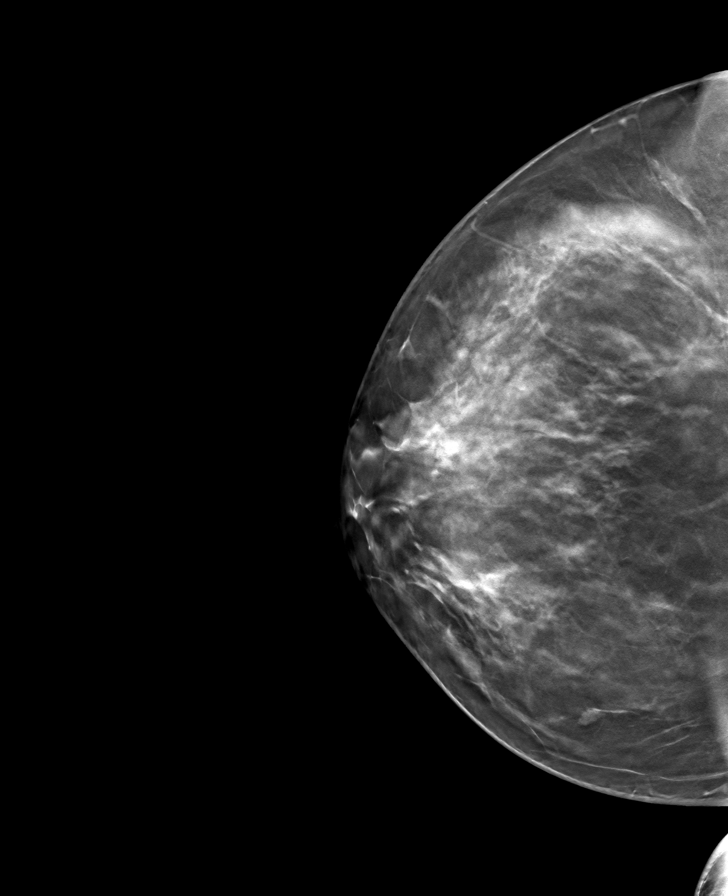

[L MLO tomo · tomo slice 47/93.0]
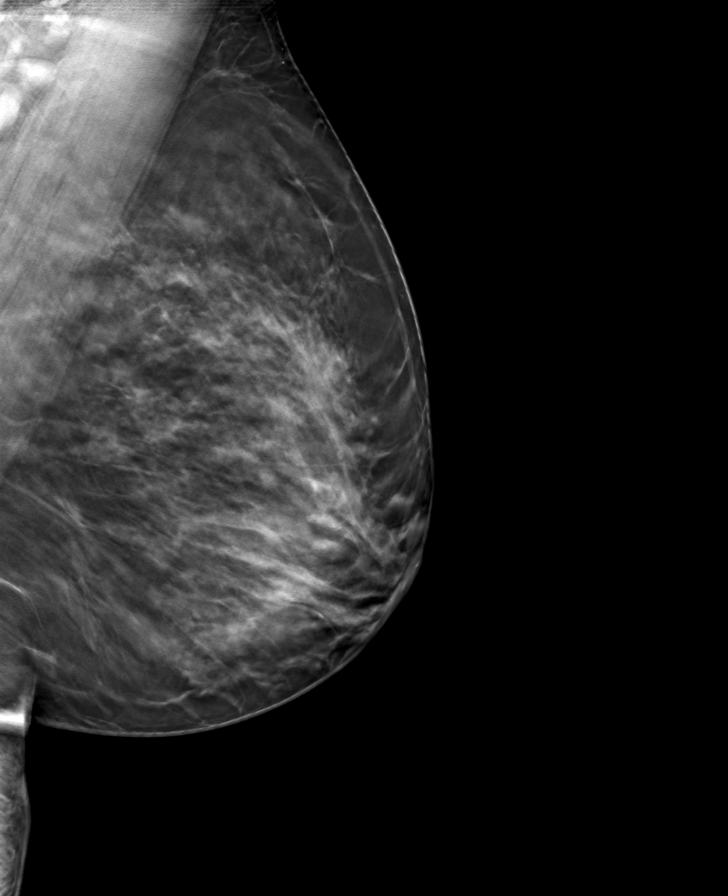

[8 of 24 positions shown; findings below may reference images not displayed]

ACR Breast Density Category c: The breast tissue is heterogeneously
dense, which may obscure small masses.
FINDINGS: There are no findings suspicious for malignancy.
IMPRESSION: No mammographic evidence of malignancy. A result letter of this
screening mammogram will be mailed directly to the patient.

RECOMMENDATION:
Screening mammogram in one year. (Code:Q3-W-BC3)

BI-RADS CATEGORY  1: Negative.

## 2023-09-15 ENCOUNTER — Encounter: Payer: Self-pay | Admitting: Orthopaedic Surgery

## 2023-09-15 ENCOUNTER — Ambulatory Visit (INDEPENDENT_AMBULATORY_CARE_PROVIDER_SITE_OTHER): Payer: PRIVATE HEALTH INSURANCE | Admitting: Orthopaedic Surgery

## 2023-09-15 ENCOUNTER — Other Ambulatory Visit (INDEPENDENT_AMBULATORY_CARE_PROVIDER_SITE_OTHER): Payer: PRIVATE HEALTH INSURANCE

## 2023-09-15 DIAGNOSIS — Z96651 Presence of right artificial knee joint: Secondary | ICD-10-CM | POA: Diagnosis not present

## 2023-09-15 DIAGNOSIS — M25561 Pain in right knee: Secondary | ICD-10-CM

## 2023-09-15 DIAGNOSIS — G8929 Other chronic pain: Secondary | ICD-10-CM

## 2023-09-15 NOTE — Progress Notes (Signed)
The patient is a pleasant 49 year old female who comes in today for second opinion as it relates to a right partial knee arthroplasty that she had performed in April 2024.  That was 9 months ago.  She says she still has some painful range of motion of that knee and pain when standing for too long.  She does not describe any significant instability symptoms.  She has seen partner in this office before for other issues and wanted to have her right knee checked out.  She does not walk with assistive device.  She is very active.  She last saw the other orthopedic office about 3 months ago and was released.  Examination of her right knee shows a well-healed incision over the knee.  There is minimal swelling with the right knee that looks appropriate at 9 months postoperative.  She has full range of motion of her right knee.  There are some slight quad weakness but is only slight.  There is some slight plate in the knee but that is only slight as well.  Plain films standing of the right knee today show well-seated partial knee arthroplasty of the medial compartment with no complicating features.  There is no evidence of loosening.  The alignment appears anatomic and there is no effusion.  I do feel that she should give it a full year with working on quad strengthening exercises.  She wants to get back to water aerobics I think that is fine for her but I really want her to focus on strengthening her quad muscles.  I have even sometimes had success with a compression knee sleeve such as a copper fit knee sleeve which can help provide some support as someone is getting their quad stronger.  It is worth trying all of those modalities.  I do feel that if she is having problems at her 1 year time point from surgery, she should at least seek follow-up with her primary orthopedic surgeon first.  She agrees to this as well.  All questions and concerns were answered and addressed.

## 2023-10-14 ENCOUNTER — Encounter: Payer: Self-pay | Admitting: Nurse Practitioner

## 2023-10-14 ENCOUNTER — Ambulatory Visit: Payer: PRIVATE HEALTH INSURANCE | Admitting: Nurse Practitioner

## 2023-10-14 VITALS — BP 132/92 | HR 95 | Temp 97.6°F

## 2023-10-14 DIAGNOSIS — N3001 Acute cystitis with hematuria: Secondary | ICD-10-CM

## 2023-10-14 LAB — POCT URINALYSIS DIPSTICK
Bilirubin, UA: NEGATIVE
Glucose, UA: NEGATIVE
Ketones, UA: NEGATIVE
Nitrite, UA: NEGATIVE
Protein, UA: NEGATIVE
Spec Grav, UA: 1.025 (ref 1.010–1.025)
Urobilinogen, UA: 0.2 U/dL — AB
pH, UA: 6 (ref 5.0–8.0)

## 2023-10-14 MED ORDER — FLUCONAZOLE 150 MG PO TABS: 150.0000 mg | ORAL_TABLET | Freq: Once | ORAL | 0 refills | Status: AC

## 2023-10-14 MED ORDER — NITROFURANTOIN MONOHYD MACRO 100 MG PO CAPS: 100.0000 mg | ORAL_CAPSULE | Freq: Two times a day (BID) | ORAL | 0 refills | Status: AC

## 2023-10-14 NOTE — Progress Notes (Signed)
  Acute Office Visit  Subjective:     Patient ID: Lori Cameron, female    DOB: [birth year], 48 y.o.   MRN: [id number]  Chief Complaint  Patient presents with   Urinary Frequency    Patient is in today for c/o urinary frequency,  Endorses Incomplete bladder emptying, mild urinary burning.  Noticed 3 days ago.  Denies fevers, chills, flank pain.  Denies vaginal discharge or gross hematuria.     Review of Systems  Constitutional:  Negative for chills and fever.  Respiratory:  Negative for shortness of breath.   Cardiovascular:  Negative for chest pain.  Gastrointestinal:  Negative for abdominal pain, constipation, diarrhea, nausea and vomiting.  Genitourinary:  Positive for dysuria, frequency and urgency. Negative for flank pain and hematuria.        Objective:    BP (!) 132/92 (BP Location: Right Arm, Patient Position: Sitting, Cuff Size: Normal)   Pulse 95   Temp 97.6 F (36.4 C) (Temporal)   SpO2 98%    Physical Exam HENT:     Head: Normocephalic.  Cardiovascular:     Rate and Rhythm: Regular rhythm.     Heart sounds: Normal heart sounds.  Pulmonary:     Effort: Pulmonary effort is normal.     Breath sounds: Normal breath sounds.  Neurological:     General: No focal deficit present.     Mental Status: She is alert and oriented to person, place, and time.     Results for orders placed or performed in visit on 10/14/23  POCT urinalysis dipstick  Result Value Ref Range   Color, UA dark yellow    Clarity, UA clear    Glucose, UA Negative Negative   Bilirubin, UA Negative    Ketones, UA Negative    Spec Grav, UA 1.025 1.010 - 1.025   Blood, UA Moderate    pH, UA 6.0 5.0 - 8.0   Protein, UA Negative Negative   Urobilinogen, UA 0.2 (A) 0.2 or 1.0 E.U./dL   Nitrite, UA Negative    Leukocytes, UA Large (3+) (A) Negative   Appearance     Odor          Assessment & Plan:   Problem List Items Addressed This Visit   None Visit Diagnoses       Acute  cystitis with hematuria    -  Primary   Relevant Medications   nitrofurantoin, macrocrystal-monohydrate, (MACROBID) 100 MG capsule   fluconazole (DIFLUCAN) 150 MG tablet   Other Relevant Orders   POCT urinalysis dipstick (Completed)     Positive leukocytes and mod blood in dipstick test. Treat with nitrofurantoin. Patient requested diflucan as well. Encouraged probiotics with use of antibiotics.   Meds ordered this encounter  Medications   nitrofurantoin, macrocrystal-monohydrate, (MACROBID) 100 MG capsule    Sig: Take 1 capsule (100 mg total) by mouth 2 (two) times daily for 5 days.    Dispense:  10 capsule    Refill:  0    Supervising Provider:   Erasmo Downer [[id number]]   fluconazole (DIFLUCAN) 150 MG tablet    Sig: Take 1 tablet (150 mg total) by mouth once for 1 dose. Can repeat second dose in 72 hours if symptoms persist.    Dispense:  2 tablet    Refill:  0    Supervising Provider:   Erasmo Downer [[id number]]    As needed.   Gloris Ham, NP

## 2023-10-21 ENCOUNTER — Other Ambulatory Visit: Payer: Self-pay | Admitting: Nurse Practitioner

## 2023-10-21 DIAGNOSIS — N3001 Acute cystitis with hematuria: Secondary | ICD-10-CM

## 2023-10-21 NOTE — Progress Notes (Signed)
 Spoke to patient on Thursday 2/20, recommended return to clinic for urine culture as unable to obtain Tuesday 2/18. She wanted to wait until completing antibiotics to return. Today, she reports improvement of dysuria but still has some frequency and still feels incomplete voiding. Will send UA with urine culture.

## 2023-10-22 NOTE — Progress Notes (Unsigned)
 Office Visit Note  Patient: Lori Cameron             Date of Birth: 10/12/1975           MRN: [id number]             PCP: Renford Dills, MD Referring: Renford Dills, MD Visit Date: 11/05/2023 Occupation: @GUAROCC @  Subjective:  Pain in multiple joints  History of Present Illness: Lori Cameron is a 48 y.o. female with history of osteoarthritis. Patient was last seen in the office on 05/14/21.  Patient has been under the care of Dr. Christell Constant and Dr. Alvester Morin for chronic lower back pain.  Patient states that she is scheduled for an MRI of the lumbar spine on 11/11/2023.  Patient states that she has tried physical therapy and other conservative treatment measures.  She has also had injections in the past which provided temporary relief.  Patient continues to have chronic pain in her right knee which i had a partial replacement by Dr. Lequita Halt in the past.  She denies any Achilles tendinitis or plantar fasciitis.   Patient states for the past 2 weeks she has been experiencing increased pain in both hands specifically in the PIP joints.  Her pain has been most severe in her right hand.  She denies any obvious joint swelling.  Patient has been taking Aleve very sparingly for symptomatic relief.   Activities of Daily Living:  Patient reports morning stiffness for 5 steps.   Patient Reports nocturnal pain.  Difficulty dressing/grooming: Denies Difficulty climbing stairs: Denies Difficulty getting out of chair: Reports Difficulty using hands for taps, buttons, cutlery, and/or writing: Denies  Review of Systems  Constitutional:  Negative for fatigue.  HENT:  Negative for mouth sores, mouth dryness and nose dryness.   Eyes:  Negative for pain and dryness.  Respiratory:  Negative for shortness of breath and difficulty breathing.   Cardiovascular:  Negative for chest pain and palpitations.  Gastrointestinal:  Negative for blood in stool, constipation and diarrhea.  Endocrine: Negative for  increased urination.  Genitourinary:  Negative for involuntary urination.  Musculoskeletal:  Positive for joint pain, joint pain, joint swelling, myalgias, muscle weakness, morning stiffness, muscle tenderness and myalgias. Negative for gait problem.  Skin:  Positive for rash. Negative for color change, hair loss and sensitivity to sunlight.  Allergic/Immunologic: Negative for susceptible to infections.  Neurological:  Negative for dizziness and headaches.  Hematological:  Negative for swollen glands.  Psychiatric/Behavioral:  Positive for depressed mood and sleep disturbance. The patient is nervous/anxious.     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   C&B/ LEEP AUGUST 2010 WITH CIN 1 AND MARGINS FREE   Hypertension    PONV (postoperative nausea and vomiting)     Family History  Problem Relation Age of Onset   Diabetes Mother    Hypertension Mother  Cervical cancer Mother    Arthritis Mother    Healthy Sister    Healthy Sister    Healthy Brother    Healthy Brother    Colon cancer Maternal Aunt    Hypertension Maternal Grandmother    Heart disease Maternal Grandmother    BRCA 1/2 Neg Hx    Breast cancer Neg Hx    Past Surgical History:  Procedure Laterality Date   ABDOMINAL SURGERY     LAPAROTOMY APPENDECTOMY, LYSIS OF ADHESIONS   APPENDECTOMY     CERVICAL BIOPSY  W/ LOOP ELECTRODE EXCISION     CINI MARGINS FREE    DILITATION & CURRETTAGE/HYSTROSCOPY WITH HYDROTHERMAL ABLATION N/A 02/23/2020   Procedure: DILATATION & CURETTAGE/HYSTEROSCOPY WITH HYDROTHERMAL ABLATION;  Surgeon: Geryl Rankins, MD;  Location: [facility];  Service: Gynecology;  Laterality: N/A;   INTRAUTERINE DEVICE (IUD) INSERTION N/A 02/23/2020   Procedure: INTRAUTERINE DEVICE (IUD) INSERTION MARENA;  Surgeon: Geryl Rankins, MD;  Location: [facility];  Service: Gynecology;  Laterality: N/A;   MEDIAL PARTIAL KNEE REPLACEMENT Right 11/2021   TONSILLECTOMY AND ADENOIDECTOMY     Social History   Social History Narrative   Not on file    There is no immunization history on file for this patient.   Objective: Vital Signs: BP (!) 132/90 (BP Location: Left Arm, Patient Position: Sitting, Cuff Size: Normal)   Pulse 70   Resp 14   Ht 5\' 2"  (1.575 m)   Wt 167 lb 6.4 oz (75.9 kg)   BMI 30.62 kg/m    Physical Exam Vitals and nursing note reviewed.  Constitutional:      Appearance: She is well-developed.  HENT:     Head: Normocephalic and atraumatic.  Eyes:     Conjunctiva/sclera: Conjunctivae normal.  Cardiovascular:     Rate and Rhythm: Normal rate and regular rhythm.     Heart sounds: Normal heart sounds.  Pulmonary:     Effort: Pulmonary effort is normal.     Breath sounds: Normal breath sounds.  Abdominal:     General: Bowel sounds are normal.     Palpations: Abdomen is soft.  Musculoskeletal:     Cervical back: Normal range of motion.  Lymphadenopathy:     Cervical: No cervical adenopathy.  Skin:    General: Skin is warm and dry.     Capillary Refill: Capillary refill takes less than 2 seconds.  Neurological:     Mental Status: She is alert and oriented to person, place, and time.  Psychiatric:        Behavior: Behavior normal.      Musculoskeletal Exam: C-spine has good range of motion.  No midline spinal tenderness in the cervical or thoracic region.  She has some tenderness in  the lumbar region and over both SI joints.  No discomfort with extension of the lumbar spine.  Shoulder joints, elbow joints, wrist joints MCPs, PIPs, DIPs have good range of motion with no synovitis.  She has tenderness over the right 1st through 3rd PIP joints and the left second PIP joint.  Discomfort with full fist formation bilaterally.  Hip joints have good range of motion with no groin pain.  Right partial knee replacement has limited extension with warmth.  Left knee has good range of motion with no warmth or effusion.  Ankle joints have good range of motion with no tenderness or joint swelling.  No evidence of Achilles tendinitis.  CDAI Exam: CDAI Score: -- Patient Global: --; Provider Global: --  Swollen: 0 ; Tender: 6  Joint Exam 11/05/2023      Right  Left  IP (thumb)   Tender     PIP 2 (finger)   Tender   Tender  PIP 3 (finger)   Tender     Sacroiliac   Tender   Tender     Investigation: No additional findings.  Imaging: No results found.  Recent Labs: Lab Results  Component Value Date   WBC 5.8 04/01/2023   HGB 14.2 04/01/2023   PLT 237 04/01/2023   NA 138 04/01/2023   K 4.1 04/01/2023   CL 103 04/01/2023   CO2 24 04/01/2023   GLUCOSE 115 (H) 04/01/2023   BUN 18 04/01/2023   CREATININE 0.82 04/01/2023   BILITOT 0.7 04/01/2023   ALKPHOS 52 12/27/2013   AST 13 04/01/2023   ALT 14 04/01/2023   PROT 7.0 04/01/2023   ALBUMIN 4.0 12/27/2013   CALCIUM 9.3 04/01/2023   GFRAA >60 02/21/2020    Speciality Comments: No specialty comments available.  Procedures:  No procedures performed Allergies: Amoxicillin-pot clavulanate and Clindamycin hcl    Assessment / Plan:     Visit Diagnoses: Polyarthralgia - 01/19/21: ANA-, complements WNL, dsDNA-, ribosomal P protein-, SM ab-, SM/RNP-, Ro-, La-, thyroperoxidase ab-, Scl-70-, RF-, CK 73, uric acid 3.6, ESR 2, CCP ab negative: Patient presents today with increased pain and stiffness involving both hands.  Her symptoms  started 2 weeks ago and have been primarily affecting the PIP joints.  Plan to schedule ultrasound to assess for synovitis.  The following lab work was also obtained today.   Medication monitoring encounter -Plan to update CBC, CMP, and G6PD today.  Plan: CBC with Differential/Platelet, COMPLETE METABOLIC PANEL WITH GFR, Glucose 6 phosphate dehydrogenase  Pain in both hands - XR unremarkable from 05/14/21: Patient presents today with increased pain in both hands specifically in the PIP joints x 2 weeks.  She has not been performing any new repetitive or overuse activities.  She has not noticed any joint swelling but has discomfort when making complete fist as well as tenderness over the PIP joints.  On examination no synovitis over MCP joints noted.  She has tenderness of the right 1st through 3rd PIP joints with some thickening.  Plan to schedule ultrasound of both hands to assess for synovitis.  The following lab work will also be obtained for further evaluation. - Plan: Sedimentation rate, C-reactive protein, Rheumatoid factor, Cyclic citrul peptide antibody, IgG, Mutated Citrullinated Vimentin (MCV) Antibody  Pain in both feet - XR unremarkable: She is not experiencing any increased discomfort in her feet at this time.  No evidence of Achilles tendinitis or plantar fasciitis.  HLA B27 positive - No history of uveitis, IBD, psoriasis, Achilles tendinitis or plantar fasciitis.  Patient continues to have chronic pain in her lower back including both SI joints and the lumbar spine.  She has tenderness over both SI joints on examination today.  She is scheduled for an MRI of the lumbar spine on 11/11/2023 but may benefit from also having an MRI of the sacrum performed.  There has been discussion of possible SI joint fusion in the future.  It is unclear if her symptoms are related to active inflammatory arthritis--plan to check sed rate and CRP today along with the following lab work. She also has presented  today with increased peripheral joint involvement specifically in both hands.  She has tenderness over the PIP joints--plan to schedule an ultrasound to assess for synovitis.  Discussed the possible use of a trial of sulfasalazine in the future pending MRI and ultrasound results.  CBC, CMP, G6PD were obtained today.  Chronic SI joint pain: HLA-B27 positive.  Chronic SI joint pain.  Patient has been under the care of Dr. Shon Baton and Dr. Christell Constant.  She had bilateral ultrasound guided SI joint cortisone injections performed on 09/02/2023 by Dr. Shon Baton.  Patient has tenderness over both SI joints on examination today.  There has been discussion of possible SI joint fusion in the future.  She may benefit from having an MRI of the sacrum for further evaluation.   Left lumbar radiculitis: Chronic lower back pain.  Patient has tried physical therapy, Tylenol, oral steroids, lumbar steroid injections, and SI joint injections.  Scheduled for MRI of lumbar spine on 11/11/23.   History of partial knee replacement: Right-Performed by Dr. Lequita Halt. Slightly limited extension with warmth.  Primary osteoarthritis of left knee: No warmth or effusion noted.  Other medical conditions are listed as follows:   History of asthma  History of depression  Intramural leiomyoma of uterus    Orders: Orders Placed This Encounter  Procedures   Sedimentation rate   C-reactive protein   Rheumatoid factor   Cyclic citrul peptide antibody, IgG   Mutated Citrullinated Vimentin (MCV) Antibody   CBC with Differential/Platelet   COMPLETE METABOLIC PANEL WITH GFR   Glucose 6 phosphate dehydrogenase   No orders of the defined types were placed in this encounter.     Follow-Up Instructions: Return in about 4 weeks (around 12/03/2023) for Osteoarthritis.   Gearldine Bienenstock, PA-C  Note - This record has been created using Dragon software.  Chart creation errors have been sought, but may not always  have been located. Such  creation errors do not reflect on  the standard of medical care.

## 2023-10-23 ENCOUNTER — Telehealth: Payer: Self-pay | Admitting: Nurse Practitioner

## 2023-10-23 NOTE — Telephone Encounter (Addendum)
 Patient reached out regarding follow-up on urinalysis on 10/22/23. Results not showing through EPIC. Called and spoke to [facility], urine was not able to result due to incorrect specimen container for culture. Due to worsening symptoms patient was instructed to see PCP or urgent care today 10/22/23 for further treatment.

## 2023-10-27 ENCOUNTER — Telehealth: Payer: Self-pay | Admitting: Sports Medicine

## 2023-10-27 DIAGNOSIS — M545 Low back pain, unspecified: Secondary | ICD-10-CM

## 2023-10-27 NOTE — Telephone Encounter (Signed)
 Patient called says she is still having pain in her back. Would like a MRI, her cb# [phone]

## 2023-10-28 NOTE — Telephone Encounter (Signed)
 I called lmom advised that Dr. Christell Constant did order the test for her

## 2023-10-29 LAB — URINALYSIS W MICROSCOPIC + REFLEX CULTURE
Bacteria, UA: NONE SEEN /HPF
Bilirubin Urine: NEGATIVE
Hgb urine dipstick: NEGATIVE
Hyaline Cast: NONE SEEN /LPF
Ketones, ur: NEGATIVE
Nitrites, Initial: NEGATIVE
Protein, ur: NEGATIVE
RBC / HPF: NONE SEEN /HPF (ref 0–2)
Specific Gravity, Urine: 1.019 (ref 1.001–1.035)
Squamous Epithelial / HPF: NONE SEEN /HPF (ref <=5)
pH: 5.5 (ref 5.0–8.0)

## 2023-10-29 LAB — EXTRA URINE SPECIMEN

## 2023-10-29 LAB — CULTURE INDICATED

## 2023-10-29 LAB — URINE CULTURE

## 2023-11-05 ENCOUNTER — Ambulatory Visit: Payer: PRIVATE HEALTH INSURANCE | Attending: Physician Assistant | Admitting: Physician Assistant

## 2023-11-05 ENCOUNTER — Encounter: Payer: Self-pay | Admitting: Physician Assistant

## 2023-11-05 VITALS — BP 132/90 | HR 70 | Resp 14 | Ht 62.0 in | Wt 167.4 lb

## 2023-11-05 DIAGNOSIS — Z96659 Presence of unspecified artificial knee joint: Secondary | ICD-10-CM

## 2023-11-05 DIAGNOSIS — Z1589 Genetic susceptibility to other disease: Secondary | ICD-10-CM | POA: Diagnosis not present

## 2023-11-05 DIAGNOSIS — M79672 Pain in left foot: Secondary | ICD-10-CM

## 2023-11-05 DIAGNOSIS — D251 Intramural leiomyoma of uterus: Secondary | ICD-10-CM

## 2023-11-05 DIAGNOSIS — M79641 Pain in right hand: Secondary | ICD-10-CM | POA: Diagnosis not present

## 2023-11-05 DIAGNOSIS — M2550 Pain in unspecified joint: Secondary | ICD-10-CM

## 2023-11-05 DIAGNOSIS — M533 Sacrococcygeal disorders, not elsewhere classified: Secondary | ICD-10-CM

## 2023-11-05 DIAGNOSIS — Z5181 Encounter for therapeutic drug level monitoring: Secondary | ICD-10-CM

## 2023-11-05 DIAGNOSIS — M79671 Pain in right foot: Secondary | ICD-10-CM | POA: Diagnosis not present

## 2023-11-05 DIAGNOSIS — Z8659 Personal history of other mental and behavioral disorders: Secondary | ICD-10-CM

## 2023-11-05 DIAGNOSIS — M79642 Pain in left hand: Secondary | ICD-10-CM

## 2023-11-05 DIAGNOSIS — M5416 Radiculopathy, lumbar region: Secondary | ICD-10-CM

## 2023-11-05 DIAGNOSIS — G8929 Other chronic pain: Secondary | ICD-10-CM

## 2023-11-05 DIAGNOSIS — Z8709 Personal history of other diseases of the respiratory system: Secondary | ICD-10-CM

## 2023-11-05 DIAGNOSIS — M1712 Unilateral primary osteoarthritis, left knee: Secondary | ICD-10-CM

## 2023-11-05 DIAGNOSIS — M170 Bilateral primary osteoarthritis of knee: Secondary | ICD-10-CM

## 2023-11-05 NOTE — Progress Notes (Signed)
 CBC WNL

## 2023-11-06 NOTE — Progress Notes (Signed)
 ALT is elevated-35. Please clarify if she has been taking tylenol or alcohol.   ESR WNL

## 2023-11-06 NOTE — Progress Notes (Signed)
 CRP WNL RF negative

## 2023-11-09 LAB — CBC WITH DIFFERENTIAL/PLATELET
Absolute Lymphocytes: 2102 {cells}/uL (ref 850–3900)
Absolute Monocytes: 414 {cells}/uL (ref 200–950)
Basophils Absolute: 37 {cells}/uL (ref 0–200)
Basophils Relative: 0.5 %
Eosinophils Absolute: 141 {cells}/uL (ref 15–500)
Eosinophils Relative: 1.9 %
HCT: 43.4 % (ref 35.0–45.0)
Hemoglobin: 14.4 g/dL (ref 11.7–15.5)
MCH: 31 pg (ref 27.0–33.0)
MCHC: 33.2 g/dL (ref 32.0–36.0)
MCV: 93.3 fL (ref 80.0–100.0)
MPV: 9.1 fL (ref 7.5–12.5)
Monocytes Relative: 5.6 %
Neutro Abs: 4706 {cells}/uL (ref 1500–7800)
Neutrophils Relative %: 63.6 %
Platelets: 297 10*3/uL (ref 140–400)
RBC: 4.65 10*6/uL (ref 3.80–5.10)
RDW: 12.3 % (ref 11.0–15.0)
Total Lymphocyte: 28.4 %
WBC: 7.4 10*3/uL (ref 3.8–10.8)

## 2023-11-09 LAB — C-REACTIVE PROTEIN: CRP: <3 mg/L (ref <8.0)

## 2023-11-09 LAB — COMPLETE METABOLIC PANEL WITH GFR
AG Ratio: 1.8 (calc) (ref 1.0–2.5)
ALT: 35 U/L — ABNORMAL HIGH (ref 6–29)
AST: 23 U/L (ref 10–35)
Albumin: 4.4 g/dL (ref 3.6–5.1)
Alkaline phosphatase (APISO): 56 U/L (ref 31–125)
BUN: 14 mg/dL (ref 7–25)
CO2: 29 mmol/L (ref 20–32)
Calcium: 9.2 mg/dL (ref 8.6–10.2)
Chloride: 99 mmol/L (ref 98–110)
Creat: 0.76 mg/dL (ref 0.50–0.99)
Globulin: 2.4 g/dL (ref 1.9–3.7)
Glucose, Bld: 103 mg/dL — ABNORMAL HIGH (ref 65–99)
Potassium: 4.4 mmol/L (ref 3.5–5.3)
Sodium: 135 mmol/L (ref 135–146)
Total Bilirubin: 0.6 mg/dL (ref 0.2–1.2)
Total Protein: 6.8 g/dL (ref 6.1–8.1)
eGFR: 97 mL/min/{1.73_m2} (ref >=60)

## 2023-11-09 LAB — SEDIMENTATION RATE: Sed Rate: 2 mm/h (ref 0–20)

## 2023-11-09 LAB — RHEUMATOID FACTOR: Rheumatoid fact SerPl-aCnc: <10 [IU]/mL (ref <14)

## 2023-11-09 LAB — CYCLIC CITRUL PEPTIDE ANTIBODY, IGG: Cyclic Citrullin Peptide Ab: <16 U

## 2023-11-09 LAB — MUTATED CITRULLINATED VIMENTIN (MCV) ANTIBODY: MUTATED CITRULLINATED VIMENTIN (MCV) AB: <20 U/mL (ref <20)

## 2023-11-09 LAB — GLUCOSE 6 PHOSPHATE DEHYDROGENASE: G-6PDH: 16.7 U/g{Hb} (ref 7.0–20.5)

## 2023-11-09 NOTE — Progress Notes (Signed)
 Anti-CCP negative  G6PDH WNL

## 2023-11-10 NOTE — Progress Notes (Signed)
 MCV negative

## 2023-11-11 ENCOUNTER — Ambulatory Visit
Admission: RE | Admit: 2023-11-11 | Discharge: 2023-11-11 | Disposition: A | Discharge status: Home / Self Care | Payer: PRIVATE HEALTH INSURANCE | Source: Ambulatory Visit | Attending: Orthopedic Surgery

## 2023-11-11 DIAGNOSIS — M545 Low back pain, unspecified: Secondary | ICD-10-CM

## 2023-11-14 NOTE — Progress Notes (Deleted)
 Office Visit Note  Patient: Lori Cameron             Date of Birth: 11/15/1975           MRN: [id number]             PCP: Renford Dills, MD Referring: Renford Dills, MD Visit Date: 11/28/2023 Occupation: @GUAROCC @  Subjective:  No chief complaint on file.   History of Present Illness: Lori Cameron is a 48 y.o. female ***     Activities of Daily Living:  Patient reports morning stiffness for *** {minute/hour:19697}.   Patient {ACTIONS;DENIES/REPORTS:21021675::"Denies"} nocturnal pain.  Difficulty dressing/grooming: {ACTIONS;DENIES/REPORTS:21021675::"Denies"} Difficulty climbing stairs: {ACTIONS;DENIES/REPORTS:21021675::"Denies"} Difficulty getting out of chair: {ACTIONS;DENIES/REPORTS:21021675::"Denies"} Difficulty using hands for taps, buttons, cutlery, and/or writing: {ACTIONS;DENIES/REPORTS:21021675::"Denies"}  No Rheumatology ROS completed.   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   C&B/ LEEP AUGUST 2010 WITH CIN 1 AND MARGINS FREE   Hypertension    PONV (postoperative nausea and vomiting)     Family History  Problem Relation Age of Onset   Diabetes Mother    Hypertension Mother    Cervical cancer Mother    Arthritis Mother    Healthy Sister    Healthy Sister    Healthy Brother    Healthy Brother    Colon cancer Maternal Aunt    Hypertension Maternal Grandmother    Heart disease Maternal  Grandmother    BRCA 1/2 Neg Hx    Breast cancer Neg Hx    Past Surgical History:  Procedure Laterality Date   ABDOMINAL SURGERY     LAPAROTOMY APPENDECTOMY, LYSIS OF ADHESIONS   APPENDECTOMY     CERVICAL BIOPSY  W/ LOOP ELECTRODE EXCISION     CINI MARGINS FREE   DILITATION & CURRETTAGE/HYSTROSCOPY WITH HYDROTHERMAL ABLATION N/A 02/23/2020   Procedure: DILATATION & CURETTAGE/HYSTEROSCOPY WITH HYDROTHERMAL ABLATION;  Surgeon: Geryl Rankins, MD;  Location: [facility];  Service: Gynecology;  Laterality: N/A;   INTRAUTERINE DEVICE (IUD) INSERTION N/A 02/23/2020   Procedure: INTRAUTERINE DEVICE (IUD) INSERTION MARENA;  Surgeon: Geryl Rankins, MD;  Location: [facility];  Service: Gynecology;  Laterality: N/A;   MEDIAL PARTIAL KNEE REPLACEMENT Right 11/2021   TONSILLECTOMY AND ADENOIDECTOMY     Social History   Social History Narrative   Not on file    There is no immunization history on file for this patient.   Objective: Vital Signs: There were no vitals taken for this visit.   Physical Exam   Musculoskeletal Exam: ***  CDAI Exam: CDAI Score: -- Patient Global: --; Provider Global: -- Swollen: --; Tender: -- Joint Exam 11/28/2023   No joint exam has been documented for this visit   There is currently no information documented on the homunculus. Go to the Rheumatology activity and complete the homunculus joint exam.  Investigation: No additional findings.  Imaging: No results found.  Recent Labs: Lab Results  Component Value Date   WBC 7.4 11/05/2023   HGB 14.4 11/05/2023   PLT 297 11/05/2023   NA 135 11/05/2023   K 4.4 11/05/2023   CL 99 11/05/2023   CO2 29 11/05/2023   GLUCOSE 103 (H) 11/05/2023   BUN 14 11/05/2023   CREATININE 0.76 11/05/2023   BILITOT 0.6 11/05/2023   ALKPHOS 52 12/27/2013   AST 23 11/05/2023   ALT 35 (H) 11/05/2023   PROT 6.8 11/05/2023   ALBUMIN 4.0 12/27/2013   CALCIUM 9.2 11/05/2023   GFRAA >60  02/21/2020    Speciality Comments: No specialty comments available.  Procedures:  No procedures performed Allergies: Amoxicillin-pot clavulanate and Clindamycin hcl   Assessment / Plan:     Visit Diagnoses: No diagnosis found.  Orders: No orders of the defined types were placed in this encounter.  No orders of the defined types were placed in this encounter.   Face-to-face time spent with patient was *** minutes. Greater than 50% of time was spent in counseling and coordination of care.  Follow-Up Instructions: No follow-ups on file.   Ellen Henri, CMA  Note - This record has been created using Animal nutritionist.  Chart creation errors have been sought, but may not always  have been located. Such creation errors do not reflect on  the standard of medical care.

## 2023-11-18 ENCOUNTER — Ambulatory Visit: Payer: PRIVATE HEALTH INSURANCE | Admitting: Orthopedic Surgery

## 2023-11-18 DIAGNOSIS — M5136 Other intervertebral disc degeneration, lumbar region with discogenic back pain only: Secondary | ICD-10-CM

## 2023-11-18 NOTE — Progress Notes (Signed)
 Orthopedic Spine Surgery Office Note   Assessment: Patient is a 48 y.o. female with low back pain.  Her pain has changed and she no longer has physical exam findings consistent with SI joint pathology.  Her pain seems more discogenic in nature     Plan: -Patient has tried PT, tylenol, oral steroids, lumbar steroid injections, SI joint injections -Since her pain has changed and none of her physical exam findings are pointing to the SI joint, I did not recommend further SI joint injection at this time -She can continue with NSAIDs. Recommended lidocaine patches.  Provided her with a referral to PT.  Recommended trying an ESI -If her pain persists in spite of these treatments, then her options would be lumbar fusion versus pain management.  I explained some of the drawbacks of both of those treatments -Patient should return to office in 8 weeks, x-rays at next visit: AP/lateral/flex/ex lumbar     Patient expressed understanding of the plan and all questions were answered to the patient's satisfaction.    ___________________________________________________________________________     History:   Patient is a 48 y.o. female who presents today with chronic low back pain.  Patient states that her back pain continues to get worse.  She said it steadily gotten worse over time.  She has no pain radiating to either lower extremity.  She says it feels a little different than when she had been seen in the office before.  Feels more midline in the lowest lumbar region.  She notes it is worse if she is standing or bending over.  It gets better if she lays down.  She has not noticed any weakness in either lower extremity.  No bowel or bladder incontinence.  No saddle anesthesia.  She rates the pain as a 10 out of 10 at its worst.   Treatments tried: PT, tylenol, oral steroids, lumbar injections, SI joint injections     Physical Exam:   General: no acute distress, appears stated age Neurologic: alert,  answering questions appropriately, following commands Respiratory: unlabored breathing on room air, symmetric chest rise Psychiatric: appropriate affect, normal cadence to speech     MSK (spine):   -Strength exam                                                   Left                  Right EHL                              5/5                  5/5 TA                                 5/5                  5/5 GSC                             5/5                  5/5 Knee extension  5/5                  5/5 Hip flexion                    5/5                  5/5   -Sensory exam                           Sensation intact to light touch in L3-S1 nerve distributions of bilateral lower extremities     -Left hip exam: negative Gaenslen's, negative FABER, negative SI joint compression test, negative stinchfield -Right hip exam: negative Gaenslen's, negative FABER, negative SI joint compression test, negative stinchfield  -Had pain with pressure over the L5/S1 area, pain decreased with tensing of the paraspinal muscles   Imaging: XRs of the lumbar spine from 01/06/2023 were previously independently reviewed and interpreted, showing disc height loss with anterior osteophyte formation at L5/S1. No other significant degenerative changes. No evidence of instability on flexion/extension views.    MRI of the lumbar spine from 11/11/2023 was independently reviewed and interpreted, showing DDD at L5/S1 with disc bulge posteriorly.  No significant stenosis seen.  No other DDD seen.     Patient name: Lori Cameron Patient MRN: [id number] Date of visit: 11/18/23

## 2023-11-19 ENCOUNTER — Ambulatory Visit: Payer: PRIVATE HEALTH INSURANCE

## 2023-11-19 ENCOUNTER — Ambulatory Visit: Payer: PRIVATE HEALTH INSURANCE | Attending: Rheumatology | Admitting: Rheumatology

## 2023-11-19 DIAGNOSIS — M79642 Pain in left hand: Secondary | ICD-10-CM | POA: Diagnosis not present

## 2023-11-19 DIAGNOSIS — M79641 Pain in right hand: Secondary | ICD-10-CM

## 2023-11-19 NOTE — Progress Notes (Signed)
 Visit diagnosis: Pain in both hands  Patient was here to get ultrasound examination of bilateral hands due to ongoing pain and discomfort.  Ultrasound examination of bilateral hands was performed per EULAR recommendations. Using 15 MHz transducer, grayscale and power Doppler bilateral second and third MCP joints both dorsal and volar aspects were evaluated to look for synovitis or tenosynovitis. The findings were there was no synovitis or tenosynovitis on ultrasound examination.  Impression: No synovitis was noted on the limited ultrasound examination of both hands.   Ultrasound results were discussed with the patient.  Pollyann Savoy, MD

## 2023-11-28 ENCOUNTER — Ambulatory Visit: Payer: PRIVATE HEALTH INSURANCE | Admitting: Rheumatology

## 2023-11-28 DIAGNOSIS — M79642 Pain in left hand: Secondary | ICD-10-CM

## 2023-11-28 DIAGNOSIS — Z8709 Personal history of other diseases of the respiratory system: Secondary | ICD-10-CM

## 2023-11-28 DIAGNOSIS — M5416 Radiculopathy, lumbar region: Secondary | ICD-10-CM

## 2023-11-28 DIAGNOSIS — Z1589 Genetic susceptibility to other disease: Secondary | ICD-10-CM

## 2023-11-28 DIAGNOSIS — G8929 Other chronic pain: Secondary | ICD-10-CM

## 2023-11-28 DIAGNOSIS — D251 Intramural leiomyoma of uterus: Secondary | ICD-10-CM

## 2023-11-28 DIAGNOSIS — M2550 Pain in unspecified joint: Secondary | ICD-10-CM

## 2023-11-28 DIAGNOSIS — Z5181 Encounter for therapeutic drug level monitoring: Secondary | ICD-10-CM

## 2023-11-28 DIAGNOSIS — M79671 Pain in right foot: Secondary | ICD-10-CM

## 2023-11-28 DIAGNOSIS — Z96659 Presence of unspecified artificial knee joint: Secondary | ICD-10-CM

## 2023-11-28 DIAGNOSIS — Z8659 Personal history of other mental and behavioral disorders: Secondary | ICD-10-CM

## 2023-11-28 DIAGNOSIS — M1712 Unilateral primary osteoarthritis, left knee: Secondary | ICD-10-CM

## 2023-12-09 ENCOUNTER — Other Ambulatory Visit: Payer: Self-pay

## 2023-12-09 ENCOUNTER — Ambulatory Visit (INDEPENDENT_AMBULATORY_CARE_PROVIDER_SITE_OTHER): Payer: Self-pay | Admitting: Physical Medicine and Rehabilitation

## 2023-12-09 DIAGNOSIS — M5416 Radiculopathy, lumbar region: Secondary | ICD-10-CM

## 2023-12-09 MED ORDER — METHYLPREDNISOLONE ACETATE 40 MG/ML IJ SUSP
40.0000 mg | Freq: Once | INTRAMUSCULAR | Status: AC
Start: 2023-12-09 — End: 2023-12-09
  Administered 2023-12-09: 40 mg

## 2023-12-09 NOTE — Patient Instructions (Signed)

## 2023-12-09 NOTE — Progress Notes (Unsigned)
 Pain Scale   Average Pain 10 Patient advising when she sits for log periods of time he pain increases and when she is lying down at night. She however get some relieve when she walks for a moment        +Driver, -BT, -Dye Allergies.

## 2023-12-11 NOTE — Procedures (Signed)
 Lumbar Epidural Steroid Injection - Interlaminar Approach with Fluoroscopic Guidance  Patient: Lori Cameron      Date of Birth: 08-31-1975 MRN: [id number] PCP: Merl Star, MD      Visit Date: 12/09/2023   Universal Protocol:     Consent Given By: the patient  Position: PRONE  Additional Comments: Vital signs were monitored before and after the procedure. Patient was prepped and draped in the usual sterile fashion. The correct patient, procedure, and site was verified.   Injection Procedure Details:   Procedure diagnoses: Lumbar radiculopathy [M54.16]   Meds Administered:  Meds ordered this encounter  Medications   methylPREDNISolone acetate (DEPO-MEDROL) injection 40 mg     Laterality: Right  Location/Site:  L5-S1  Needle: 3.5 in., 20 ga. Tuohy  Needle Placement: Paramedian epidural  Findings:   -Comments: Excellent flow of contrast into the epidural space.  Procedure Details: Using a paramedian approach from the side mentioned above, the region overlying the inferior lamina was localized under fluoroscopic visualization and the soft tissues overlying this structure were infiltrated with 4 ml. of 1% Lidocaine without Epinephrine. The Tuohy needle was inserted into the epidural space using a paramedian approach.   The epidural space was localized using loss of resistance along with counter oblique bi-planar fluoroscopic views.  After negative aspirate for air, blood, and CSF, a 2 ml. volume of Isovue-250 was injected into the epidural space and the flow of contrast was observed. Radiographs were obtained for documentation purposes.    The injectate was administered into the level noted above.   Additional Comments:  No complications occurred Dressing: 2 x 2 sterile gauze and Band-Aid    Post-procedure details: Patient was observed during the procedure. Post-procedure instructions were reviewed.  Patient left the clinic in stable condition.

## 2023-12-11 NOTE — Progress Notes (Signed)
 Lori Cameron - 48 y.o. female MRN [id number]  Date of birth: 1976/05/04  Office Visit Note: Visit Date: 12/09/2023 PCP: Renford Dills, MD Referred by: London Sheer, MD  Subjective: Chief Complaint  Patient presents with   Lower Back - Pain   HPI:  Lori Cameron is a 48 y.o. female who comes in today at the request of Dr. Willia Craze for planned Right L5-S1 Lumbar Interlaminar epidural steroid injection with fluoroscopic guidance.  The patient has failed conservative care including home exercise, medications, time and activity modification.  This injection will be diagnostic and hopefully therapeutic.  Please see requesting physician notes for further details and justification.   ROS Otherwise per HPI.  Assessment & Plan: Visit Diagnoses:    ICD-10-CM   1. Lumbar radiculopathy  M54.16 XR C-ARM NO REPORT    Epidural Steroid injection    methylPREDNISolone acetate (DEPO-MEDROL) injection 40 mg      Plan: No additional findings.   Meds & Orders:  Meds ordered this encounter  Medications   methylPREDNISolone acetate (DEPO-MEDROL) injection 40 mg    Orders Placed This Encounter  Procedures   XR C-ARM NO REPORT   Epidural Steroid injection    Follow-up: Return for visit to requesting provider as needed.   Procedures: No procedures performed  Lumbar Epidural Steroid Injection - Interlaminar Approach with Fluoroscopic Guidance  Patient: Lori Cameron      Date of Birth: 19-Apr-1976 MRN: [id number] PCP: Renford Dills, MD      Visit Date: 12/09/2023   Universal Protocol:     Consent Given By: the patient  Position: PRONE  Additional Comments: Vital signs were monitored before and after the procedure. Patient was prepped and draped in the usual sterile fashion. The correct patient, procedure, and site was verified.   Injection Procedure Details:   Procedure diagnoses: Lumbar radiculopathy [M54.16]   Meds Administered:  Meds ordered this  encounter  Medications   methylPREDNISolone acetate (DEPO-MEDROL) injection 40 mg     Laterality: Right  Location/Site:  L5-S1  Needle: 3.5 in., 20 ga. Tuohy  Needle Placement: Paramedian epidural  Findings:   -Comments: Excellent flow of contrast into the epidural space.  Procedure Details: Using a paramedian approach from the side mentioned above, the region overlying the inferior lamina was localized under fluoroscopic visualization and the soft tissues overlying this structure were infiltrated with 4 ml. of 1% Lidocaine without Epinephrine. The Tuohy needle was inserted into the epidural space using a paramedian approach.   The epidural space was localized using loss of resistance along with counter oblique bi-planar fluoroscopic views.  After negative aspirate for air, blood, and CSF, a 2 ml. volume of Isovue-250 was injected into the epidural space and the flow of contrast was observed. Radiographs were obtained for documentation purposes.    The injectate was administered into the level noted above.   Additional Comments:  No complications occurred Dressing: 2 x 2 sterile gauze and Band-Aid    Post-procedure details: Patient was observed during the procedure. Post-procedure instructions were reviewed.  Patient left the clinic in stable condition.   Clinical History: MRI LUMBAR SPINE WITHOUT CONTRAST   TECHNIQUE: Multiplanar, multisequence MR imaging of the lumbar spine was performed. No intravenous contrast was administered.   COMPARISON:  MRI lumbar spine 02/06/2015.   FINDINGS: Segmentation:  Standard.   Alignment: Lumbar lordosis is maintained. No significant listhesis.   Vertebrae: Prominent Modic type 1 degenerative endplate changes at L5-S1 with associated discogenic edema  which is new since the prior study. Vertebral body heights are maintained. No additional bone marrow edema or evidence of acute fracture.   Conus medullaris and cauda equina: Conus  extends to the T12-L1 level. Conus and cauda equina appear normal.   Paraspinal and other soft tissues: Visualized paraspinal soft tissues are unremarkable. Prominence of the partially visualized uterus with suggestion of possible fibroids. Additional 3.2 cm cyst in the pelvis right of midline suggestive of adnexal cyst.   Disc levels:   T12-L1: No significant disc bulge. No significant spinal canal stenosis or foraminal stenosis.   L1-2: No significant disc bulge. No significant spinal canal stenosis or foraminal stenosis.   L2-3: No significant disc bulge. No significant spinal canal stenosis or foraminal stenosis.   L3-4: No significant disc bulge. No significant spinal canal stenosis or foraminal stenosis.   L4-5: No significant disc bulge. No significant spinal canal stenosis or foraminal stenosis.   L5-S1: Disc desiccation and moderate disc height loss. Small disc bulge which indents the ventral thecal sac resulting in narrowing of the lateral recesses without significant impingement upon the traversing nerve roots. Mild facet arthrosis. There is mild-to-moderate right and moderate left foraminal stenosis.   IMPRESSION: Degenerative changes at L5-S1 which are significantly increased since the prior MRI. Disc bulge and facet arthrosis at L5-S1 resulting in moderate left and mild-to-moderate right foraminal stenosis. There is mild lateral recess narrowing at L5-S1 without significant impingement upon the traversing nerve roots.   Prominent degenerative endplate changes and discogenic edema at L5-S1 which may contribute to back pain.   Findings suggestive of fibroid uterus. 3.2 cm cyst in the pelvis, likely adnexal cyst.     Electronically Signed   By: Emily Filbert M.D.   On: 12/05/2023 12:12     Objective:  VS:  HT:    WT:   BMI:     BP:   HR: bpm  TEMP: ( )  RESP:  Physical Exam Vitals and nursing note reviewed.  Constitutional:      General: She is  not in acute distress.    Appearance: Normal appearance. She is not ill-appearing.  HENT:     Head: Normocephalic and atraumatic.     Right Ear: External ear normal.     Left Ear: External ear normal.  Eyes:     Extraocular Movements: Extraocular movements intact.  Cardiovascular:     Rate and Rhythm: Normal rate.     Pulses: Normal pulses.  Pulmonary:     Effort: Pulmonary effort is normal. No respiratory distress.  Abdominal:     General: There is no distension.     Palpations: Abdomen is soft.  Musculoskeletal:        General: Tenderness present.     Cervical back: Neck supple.     Right lower leg: No edema.     Left lower leg: No edema.     Comments: Patient has good distal strength with no pain over the greater trochanters.  No clonus or focal weakness.  Skin:    Findings: No erythema, lesion or rash.  Neurological:     General: No focal deficit present.     Mental Status: She is alert and oriented to person, place, and time.     Sensory: No sensory deficit.     Motor: No weakness or abnormal muscle tone.     Coordination: Coordination normal.  Psychiatric:        Mood and Affect: Mood normal.  Behavior: Behavior normal.      Imaging: No results found.

## 2023-12-22 ENCOUNTER — Other Ambulatory Visit: Payer: Self-pay

## 2023-12-22 ENCOUNTER — Encounter (HOSPITAL_BASED_OUTPATIENT_CLINIC_OR_DEPARTMENT_OTHER): Payer: Self-pay | Admitting: Physical Therapy

## 2023-12-22 ENCOUNTER — Ambulatory Visit (HOSPITAL_BASED_OUTPATIENT_CLINIC_OR_DEPARTMENT_OTHER): Payer: PRIVATE HEALTH INSURANCE | Attending: Orthopedic Surgery | Admitting: Physical Therapy

## 2023-12-22 DIAGNOSIS — M545 Low back pain, unspecified: Secondary | ICD-10-CM | POA: Insufficient documentation

## 2023-12-22 DIAGNOSIS — M6281 Muscle weakness (generalized): Secondary | ICD-10-CM | POA: Insufficient documentation

## 2023-12-22 DIAGNOSIS — R262 Difficulty in walking, not elsewhere classified: Secondary | ICD-10-CM | POA: Insufficient documentation

## 2023-12-22 DIAGNOSIS — M5136 Other intervertebral disc degeneration, lumbar region with discogenic back pain only: Secondary | ICD-10-CM | POA: Insufficient documentation

## 2023-12-22 NOTE — Therapy (Signed)
 OUTPATIENT PHYSICAL THERAPY THORACOLUMBAR EVALUATION   Patient Name: Lori Cameron MRN: [id number] DOB:Nov 03, 1975, 48 y.o., female Today's Date: 12/22/2023  END OF SESSION:  PT End of Session - 12/22/23 1652     Visit Number 1    Number of Visits 19    Authorization Type [employer]    PT Start Time 1316    PT Stop Time 1400    PT Time Calculation (min) 44 min    Activity Tolerance Patient tolerated treatment well;Patient limited by pain    Behavior During Therapy 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   C&B/ LEEP AUGUST 2010 WITH CIN 1 AND MARGINS FREE   Hypertension    PONV (postoperative nausea and vomiting)    Past Surgical History:  Procedure Laterality Date   ABDOMINAL SURGERY     LAPAROTOMY APPENDECTOMY, LYSIS OF ADHESIONS   APPENDECTOMY     CERVICAL BIOPSY  W/ LOOP ELECTRODE EXCISION     CINI MARGINS FREE   DILITATION & CURRETTAGE/HYSTROSCOPY WITH HYDROTHERMAL ABLATION N/A 02/23/2020   Procedure: DILATATION & CURETTAGE/HYSTEROSCOPY WITH HYDROTHERMAL ABLATION;  Surgeon: Johnn Najjar, MD;  Location: [facility];  Service: Gynecology;  Laterality: N/A;   INTRAUTERINE DEVICE (IUD) INSERTION N/A 02/23/2020   Procedure: INTRAUTERINE DEVICE (IUD) INSERTION MARENA;  Surgeon: Johnn Najjar, MD;  Location: [facility];  Service: Gynecology;  Laterality: N/A;   MEDIAL PARTIAL KNEE REPLACEMENT Right 11/2021   TONSILLECTOMY AND ADENOIDECTOMY     Patient Active Problem List   Diagnosis Date Noted   Primary osteoarthritis of both knees 03/13/2021   Polyarthralgia 01/19/2021   Chronic right SI joint pain 09/01/2019   Fibroid  uterus 01/07/2018   Left lumbar radiculitis 07/12/2013   Depression    Asthma 03/09/2011    PCP: Merl Star, MD   REFERRING PROVIDER:   Diedra Fowler, MD    REFERRING DIAG:  Diagnosis  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 She has had on/off back pain since then. Pt reports since Oct, she has had more radiating pain in the R LE.  Pt reports long term history of back pain even with last round of PT for her knees. 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 Pt has had PT for the low back before as well as aquatic for the R knee. Pt with some relief with cortisone. Pt locations pain more to the R hip rather than the back. 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 Pt has trouble sleeping and getting in and out of bed. Denies cancer red flags.    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 Yes: NPRS scale: 3/10, 10/10 at worst  Pain location: R lateral hip Pain description: deep, sharp pain  Aggravating factors: bending down, coughing, sneezing Relieving factors: nothing provides relief; Aleve  no longer provides relief.   PRECAUTIONS: None   WEIGHT BEARING RESTRICTIONS: No   FALLS:  Has patient fallen in last 6 months? No   LIVING ENVIRONMENT: A few steps into the house   Family  nearby  OCCUPATION:   Long term care nursing director at [employer]     Hobbies:   Member of [employer]  Would like to get back into riding her bike       PLOF: Independent   PATIENT GOALS: to have less pain, be able to take care of herself and her house     OBJECTIVE:  Note: Objective measures were completed at Evaluation unless otherwise noted.  DIAGNOSTIC FINDINGS:  MPRESSION: Degenerative changes at L5-S1 which are significantly increased since the prior MRI. Disc bulge and facet arthrosis at L5-S1 resulting in moderate left and mild-to-moderate right foraminal stenosis. There is mild lateral recess narrowing at L5-S1 without significant impingement upon the traversing nerve roots.   Prominent degenerative endplate changes and discogenic edema at L5-S1 which may contribute to back pain.   Findings suggestive of fibroid uterus. 3.2 cm cyst in the pelvis, likely adnexal cyst.   Electronically Signed   By: Denny Flack M.D.   On: 12/05/2023 12:12  PATIENT SURVEYS:  Oswestry ODI Score = 25 points (50%)  Interpretation: 41% to 60% (severe disability): Pain is a primary problem for these patients, but they may also be experiencing significant problems in travel, personal care, social life, sexual activity and sleep. A detailed evaluation is appropriate.   COGNITION: Overall cognitive status: Within functional limits for tasks assessed     SENSATION: WFL  POSTURE: No Significant postural limitations  PALPATION: TTP of bilat lumbar paraspinals, QL, glutes, SIJ borders with R >L  Stiffness and tightness with R UPA  LUMBAR ROM:   AROM eval  Flexion 75% p!  Gower's sign  Extension 100%  Right lateral flexion 100%  Left lateral flexion 100%  Right rotation 100%  Left rotation 100%   (Blank rows = not tested)  LOWER EXTREMITY ROM:   no pain during  Passive  Right eval Left eval  Hip flexion [facility]  Hip extension [facility]  Hip abduction     Hip adduction    Hip internal rotation [facility]  Hip external rotation [facility]  Knee flexion    Knee extension    Ankle dorsiflexion    Ankle plantarflexion    Ankle inversion    Ankle eversion     (Blank rows = not tested)  LOWER EXTREMITY MMT:  no myotomal weakness note; painful with all motions  MMT Right eval Left eval  Hip flexion 4/5 4+/5  Hip extension    Hip abduction 4/5 4/5  Hip adduction 4+/5 4+/5  Hip internal rotation 4/5 4/5  Hip external rotation 4/5 4/5  Knee flexion    Knee extension    Ankle dorsiflexion    Ankle plantarflexion    Ankle inversion    Ankle eversion     (Blank rows = not tested)  LUMBAR SPECIAL TESTS:  Straight leg raise test: Negative, Slump test: Positive, SI Compression/distraction test: Positive, and FABER test: Positive  FUNCTIONAL TESTS:  Supine to sit: painful with rolling and transition to seated  STS requires UE due to pain  GAIT: Distance walked: 75ft Assistive device utilized: None Level of assistance: Complete Independence Comments: WFL, increased lumbar lordosis  TREATMENT DATE: 4/28  Self massage with tennis ball or lacrosse ball  Thermotherapy   Exercises - Neutral Lumbar Spine Curl Up  - 2 x daily - 7 x weekly - 2 sets - 10 reps - 2 hold - Supine Posterior Pelvic Tilt  - 2 x daily - 7 x weekly - 2 sets - 10 reps - 2 hold - Supine Bridge  - 2 x daily - 7 x weekly - 2 sets - 10 reps                                                                                                                                 PATIENT EDUCATION:  Education details: MOI, diagnosis, prognosis, anatomy, exercise progression, DOMS expectations, muscle firing,  envelope of function, HEP, POC   Person educated: Patient Education method: Explanation, Demonstration, Tactile cues, Verbal cues, and Handouts Education comprehension: verbalized understanding, returned demonstration, verbal cues required, and tactile cues required   HOME  EXERCISE PROGRAM:     Access Code: [email or web address] URL: [email or web address] Date: 12/22/2023 Prepared by: Silver Dross   ASSESSMENT:   CLINICAL IMPRESSION: Patient is a 48 y.o. female who was seen today for physical therapy evaluation and treatment for c/c of LBP. Pt's s/s appear consistent with low back pain with concurrent SIJ sensitivity and radicular symptoms. Clinical testing does not recreate distal symptoms and  does not show myotomal weakness.  Pt's pain is highly sensitive and irritable with movement. Pt does appears to have an extension preference despite imaging results. Pt would benefit from continued skilled therapy in order to reach goals and maximize functional lumbopelvic strength and ROM for full return to PLOF.      OBJECTIVE IMPAIRMENTS: Abnormal gait, decreased activity tolerance, decreased balance, decreased endurance, decreased mobility, difficulty walking, decreased ROM, decreased strength, hypomobility, impaired flexibility, improper body mechanics, postural dysfunction, and  pain.    ACTIVITY LIMITATIONS: carrying, lifting, bending, standing, squatting, stairs, transfers, bed mobility, and locomotion level   PARTICIPATION LIMITATIONS: meal prep, cleaning, laundry, driving, shopping, and community activity and exercise   PERSONAL FACTORS: Age, Fitness, Time since onset of injury/illness/exacerbation, and 2+ co morbidities:   are also affecting patient's functional outcome.    REHAB POTENTIAL: Fair     CLINICAL DECISION MAKING: Evolving/moderate complexity   EVALUATION COMPLEXITY: Moderate     GOALS:     SHORT TERM GOALS: Target date: 02/02/2024        Pt will become independent with HEP in order to demonstrate synthesis of PT education.    Goal status: INITIAL   2.  Pt will report at least 2 pt reduction on NPRS scale for pain in order to demonstrate functional improvement with household activity, self care, and ADL.    Goal status: INITIAL          LONG TERM GOALS: Target date: 03/15/2024        Pt  will become independent with final HEP in order to demonstrate synthesis of PT education.    Goal status: INITIAL   2.  Pt will be able to demonstrate/report ability to sit/stand/sleep for extended periods of time without pain in order to demonstrate functional improvement and tolerance to static positioning.    Goal status: INITIAL   3. Pt will demonstrate at least a 12.8 improvement in Oswestry Index in order to demonstrate a clinically significant change in LBP and function.   Goal status: INITIAL   4. Pt will be able to lift/squat/hold >25 lbs in order to demonstrate functional improvement in lumbopelvic/ knee strength for return ADL and occupational duties.  Goal status: INITIAL     PLAN:   PT FREQUENCY: 1-2x/week   PT DURATION: 12 weeks   PLANNED INTERVENTIONS: Therapeutic exercises, Therapeutic activity, Neuromuscular re-education, Balance training, Gait training, Patient/Family education, Self Care, Joint mobilization, Joint manipulation, Stair training, Prosthetic training, DME instructions, Aquatic Therapy, Dry Needling, Electrical stimulation, Spinal manipulation, Spinal mobilization, Moist heat, scar mobilization, Splintting, Taping, Vasopneumatic device, Traction, Ultrasound, Ionotophoresis 4mg /ml Dexamethasone , Manual therapy, and Re-evaluation   PLAN FOR NEXT SESSION: consider nerve glide, joint mobilizations for extension, progressive flexion, abdominal strength     Silver Dross, PT 12/22/2023, 4:53 PM

## 2024-01-12 ENCOUNTER — Ambulatory Visit: Payer: PRIVATE HEALTH INSURANCE | Admitting: Orthopedic Surgery

## 2024-01-12 ENCOUNTER — Other Ambulatory Visit (INDEPENDENT_AMBULATORY_CARE_PROVIDER_SITE_OTHER): Payer: PRIVATE HEALTH INSURANCE

## 2024-01-12 DIAGNOSIS — M5136 Other intervertebral disc degeneration, lumbar region with discogenic back pain only: Secondary | ICD-10-CM | POA: Diagnosis not present

## 2024-01-12 DIAGNOSIS — M5416 Radiculopathy, lumbar region: Secondary | ICD-10-CM | POA: Diagnosis not present

## 2024-01-12 NOTE — Progress Notes (Signed)
 Orthopedic Spine Surgery Office Note   Assessment: Patient is a 48 y.o. female with low back pain.  Pain seems discogenic in nature with a degenerative L5/S1 disc     Plan: -Patient has tried PT, tylenol , oral steroids, lumbar steroid injections, SI joint injections - Talked about her remaining options at this point - surgery, SCS, pain management.  I explained some of the drawbacks of a L5/S1 fusion including adjacent segment disease.  Patient wanted to avoid pain management since this would entail chronic medication treatment.  She wanted to hear about spinal cord stimulator as it does not have some of the adjacent segment disease drawbacks as fusion surgery.  Think this is reasonable.  If she is not a good candidate for then she would have L5/S1 fusion as a remaining option -Patient should follow-up on an as-needed basis     Patient expressed understanding of the plan and all questions were answered to the patient's satisfaction.    ___________________________________________________________________________     History:   Patient is a 48 y.o. female who presents today for follow up on her back pain.  Patient continues to have significant low back pain.  She notes it in the lower lumbar region.  She has no radiating leg pain.  She says it is particularly bad after she has to change positions.  She says it is really bad in the morning because she has been laying down for a while and has to get out of bed.  Pain is beginning to interfere with her mood.  She says the pain continues to progress with time.   Treatments tried: PT, tylenol , oral steroids, lumbar injections, SI joint injections     Physical Exam:   General: no acute distress, appears stated age Neurologic: alert, answering questions appropriately, following commands Respiratory: unlabored breathing on room air, symmetric chest rise Psychiatric: appropriate affect, normal cadence to speech     MSK (spine):   -Strength  exam                                                   Left                  Right EHL                              5/5                  5/5 TA                                 5/5                  5/5 GSC                             5/5                  5/5 Knee extension            5/5                  5/5 Hip flexion  5/5                  5/5   -Sensory exam                           Sensation intact to light touch in L3-S1 nerve distributions of bilateral lower extremities     -Left hip exam: negative Gaenslen's, negative FABER, negative SI joint compression test, negative stinchfield -Right hip exam: negative Gaenslen's, negative FABER, negative SI joint compression test, negative stinchfield   -Had pain with pressure over the L5/S1 area, pain decreased with tensing of the paraspinal muscles   Imaging: XRs of the lumbar spine from 01/12/2024 were independently reviewed and interpreted, showing disc height loss with anterior osteophyte formation at L5/S1. No other significant degenerative changes. No evidence of instability on flexion/extension views.    MRI of the lumbar spine from 11/11/2023 was previously independently reviewed and interpreted, showing DDD at L5/S1 with disc bulge posteriorly.  No significant stenosis seen.  No other DDD seen.     Patient name: Lori Cameron Patient MRN: [id number] Date of visit: 01/12/24

## 2024-01-15 ENCOUNTER — Ambulatory Visit (HOSPITAL_BASED_OUTPATIENT_CLINIC_OR_DEPARTMENT_OTHER): Payer: PRIVATE HEALTH INSURANCE | Attending: Orthopedic Surgery | Admitting: Physical Therapy

## 2024-01-15 ENCOUNTER — Encounter (HOSPITAL_BASED_OUTPATIENT_CLINIC_OR_DEPARTMENT_OTHER): Payer: Self-pay | Admitting: Physical Therapy

## 2024-01-15 DIAGNOSIS — M545 Low back pain, unspecified: Secondary | ICD-10-CM | POA: Diagnosis present

## 2024-01-15 DIAGNOSIS — M6281 Muscle weakness (generalized): Secondary | ICD-10-CM | POA: Insufficient documentation

## 2024-01-15 DIAGNOSIS — R262 Difficulty in walking, not elsewhere classified: Secondary | ICD-10-CM | POA: Insufficient documentation

## 2024-01-15 NOTE — Therapy (Signed)
 OUTPATIENT PHYSICAL THERAPY THORACOLUMBAR TREATMENT   Patient Name: Lori Cameron MRN: [id number] DOB:[birth year], 48 y.o., female Today's Date: 01/15/2024  END OF SESSION:  PT End of Session - 01/15/24 1236     Visit Number 2    Number of Visits 19    Authorization Type [employer]    PT Start Time 1234    PT Stop Time 1312    PT Time Calculation (min) 38 min    Behavior During Therapy [facility] for tasks assessed/performed             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   C&B/ LEEP AUGUST 2010 WITH CIN 1 AND MARGINS FREE   Hypertension    PONV (postoperative nausea and vomiting)    Past Surgical History:  Procedure Laterality Date   ABDOMINAL SURGERY     LAPAROTOMY APPENDECTOMY, LYSIS OF ADHESIONS   APPENDECTOMY     CERVICAL BIOPSY  W/ LOOP ELECTRODE EXCISION     CINI MARGINS FREE   DILITATION & CURRETTAGE/HYSTROSCOPY WITH HYDROTHERMAL ABLATION N/A 02/23/2020   Procedure: DILATATION & CURETTAGE/HYSTEROSCOPY WITH HYDROTHERMAL ABLATION;  Surgeon: Johnn Najjar, MD;  Location: [facility];  Service: Gynecology;  Laterality: N/A;   INTRAUTERINE DEVICE (IUD) INSERTION N/A 02/23/2020   Procedure: INTRAUTERINE DEVICE (IUD) INSERTION MARENA;  Surgeon: Johnn Najjar, MD;  Location: [facility];  Service: Gynecology;  Laterality: N/A;   MEDIAL PARTIAL KNEE REPLACEMENT Right 11/2021   TONSILLECTOMY AND ADENOIDECTOMY     Patient Active Problem List   Diagnosis Date Noted   Primary osteoarthritis of both knees 03/13/2021   Polyarthralgia 01/19/2021   Chronic right SI joint pain 09/01/2019   Fibroid uterus 01/07/2018   Left lumbar radiculitis 07/12/2013   Depression    Asthma  03/09/2011    PCP: Merl Star, MD   REFERRING PROVIDER:   Diedra Fowler, MD    REFERRING DIAG:  Diagnosis  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 Wearing back brace when pain elevated.     Initial evaluation:  Pt first hurt her back 20 yrs ago transferring a pt. She has had on/off back pain since then. Pt reports since Oct, she has had more radiating pain in the R LE.  Pt reports long term history of back pain even with last round of PT for her knees. 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 Pt has had PT for the low back before as well as aquatic for the R knee. Pt with some relief with cortisone. Pt locations pain more to the R hip rather than the back. 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 Pt has trouble sleeping and getting in and out of bed. Denies cancer red flags.    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 Yes: NPRS scale: 6/10  Pain location: lower back to bilat lateral hip Pain description: deep, sharp pain  Aggravating factors: bending down, coughing, sneezing Relieving factors: nothing provides relief; Aleve  no longer provides relief.   PRECAUTIONS: None   WEIGHT BEARING RESTRICTIONS: No   FALLS:  Has patient fallen in last 6 months? No   LIVING  ENVIRONMENT: A few steps into the house   Family nearby  OCCUPATION:   Long term care nursing director at [employer]     Hobbies:   Member of [employer]  Would like to get back into riding her bike       PLOF: Independent   PATIENT GOALS: to have less pain, be able to take care of herself and her house     OBJECTIVE:  Note: Objective measures were completed at Evaluation unless otherwise noted.  DIAGNOSTIC FINDINGS:  MPRESSION: Degenerative changes at L5-S1 which are significantly increased since the prior MRI. Disc bulge and facet arthrosis at L5-S1 resulting in moderate left and mild-to-moderate right foraminal stenosis. There is mild lateral recess narrowing at L5-S1 without significant impingement upon the traversing nerve roots.   Prominent degenerative endplate changes and discogenic edema at L5-S1 which may contribute to back pain.   Findings suggestive of fibroid uterus. 3.2 cm cyst in the pelvis, likely adnexal cyst.   Electronically Signed   By: Denny Flack M.D.   On: 12/05/2023 12:12  PATIENT SURVEYS:  Oswestry ODI Score = 25 points (50%)  Interpretation: 41% to 60% (severe disability): Pain is a primary problem for these patients, but they may also be experiencing significant problems in travel, personal care, social life, sexual activity and sleep. A detailed evaluation is appropriate.   COGNITION: Overall cognitive status: Within functional limits for tasks assessed     SENSATION: WFL  POSTURE: No Significant postural limitations  PALPATION: TTP of bilat lumbar paraspinals, QL, glutes, SIJ borders with R >L  Stiffness and tightness with R UPA  LUMBAR ROM:   AROM eval  Flexion 75% p!  Gower's sign  Extension 100%  Right lateral flexion 100%  Left lateral flexion 100%  Right rotation 100%  Left rotation 100%   (Blank rows = not tested)  LOWER EXTREMITY ROM:   no pain during  Passive  Right eval Left eval  Hip flexion  [facility]  Hip extension [facility]  Hip  abduction    Hip adduction    Hip internal rotation [facility]  Hip external rotation [facility]  Knee flexion    Knee extension    Ankle dorsiflexion    Ankle plantarflexion    Ankle inversion    Ankle eversion     (Blank rows = not tested)  LOWER EXTREMITY MMT:  no myotomal weakness note; painful with all motions  MMT Right eval Left eval  Hip flexion 4/5 4+/5  Hip extension    Hip abduction 4/5 4/5  Hip adduction 4+/5 4+/5  Hip internal rotation 4/5 4/5  Hip external rotation 4/5 4/5  Knee flexion    Knee extension    Ankle dorsiflexion    Ankle plantarflexion    Ankle inversion    Ankle eversion     (Blank rows = not tested)  LUMBAR SPECIAL TESTS:  Straight leg raise test: Negative, Slump test: Positive, SI Compression/distraction test: Positive, and FABER test: Positive  FUNCTIONAL TESTS:  Supine to sit: painful with rolling and transition to seated  STS requires UE due to pain  GAIT: Distance walked: 84ft Assistive device utilized: None Level of assistance: Complete Independence Comments: WFL, increased lumbar lordosis  TREATMENT DATE:  [facility] Adult PT Treatment:                                                DATE: 01/15/24  Therapeutic Exercise: TrA set with lap press in seated position, 5 sec hold, cues to breathe In hooklying:  rocking knees R/L;  R/L piriformis stretch pulling towards opp shoulder x15s x 2 each LE;  R/L hamstring stretch/ITB/adductor with strap x 15s x 2 reps each position, each LE  Therapeutic Activity: STS with TrA set x 6 Sit to/from supine via log roll Seated scoot forward/backward with weight shift and TrA set Self Care: Instructed on freq / application of TENS (already has unit) and reminded her of ice parameters  PATIENT EDUCATION:  Education details: rationale for intervention and exercise Person educated: Patient Education method: Explanation, Demonstration, Tactile cues, Verbal  cues, Education comprehension: verbalized understanding, returned demonstration, verbal cues required, and tactile cues required   HOME EXERCISE PROGRAM:   Access Code: [email or web address] URL: [email or web address]    ASSESSMENT:   CLINICAL IMPRESSION: Positive response to abdominal bracing with transitional movements. Less pain after engaging core musculature. Updated HEP.  Will continue spinal stabilization exercises and core strengthening. Continue posture and Optometrist education.  Goals are ongoing.     Initial eval:  Patient is a 48 y.o. female who was seen today for physical therapy evaluation and treatment for c/c of LBP. Pt's s/s appear consistent with low back pain with concurrent SIJ sensitivity and radicular symptoms. Clinical testing does not recreate distal symptoms and  does not show myotomal weakness.  Pt's pain is highly sensitive and irritable with movement. Pt does appears to have an extension preference despite imaging results. Pt would benefit from continued skilled therapy in order to reach goals and maximize functional lumbopelvic strength and ROM for full return to PLOF.      OBJECTIVE IMPAIRMENTS: Abnormal gait, decreased activity tolerance, decreased balance, decreased endurance, decreased mobility, difficulty walking, decreased ROM, decreased strength, hypomobility, impaired flexibility, improper body mechanics, postural dysfunction, and pain.    ACTIVITY LIMITATIONS: carrying, lifting, bending, standing, squatting, stairs, transfers, bed mobility, and locomotion level  PARTICIPATION LIMITATIONS: meal prep, cleaning, laundry, driving, shopping, and community activity and exercise   PERSONAL FACTORS: Age, Fitness, Time since onset of injury/illness/exacerbation, and 2+ co morbidities:   are also affecting patient's functional outcome.    REHAB POTENTIAL: Fair     CLINICAL DECISION MAKING: Evolving/moderate complexity   EVALUATION COMPLEXITY:  Moderate     GOALS:     SHORT TERM GOALS: Target date: 02/02/2024        Pt will become independent with HEP in order to demonstrate synthesis of PT education.    Goal status: INITIAL   2.  Pt will report at least 2 pt reduction on NPRS scale for pain in order to demonstrate functional improvement with household activity, self care, and ADL.    Goal status: INITIAL         LONG TERM GOALS: Target date: 03/15/2024    Pt  will become independent with final HEP in order to demonstrate synthesis of PT education.    Goal status: INITIAL   2.  Pt will be able to demonstrate/report ability to sit/stand/sleep for extended periods of time without pain in order to demonstrate functional improvement and tolerance to static positioning.    Goal status: INITIAL   3. Pt will demonstrate at least a 12.8 improvement in Oswestry Index in order to demonstrate a clinically significant change in LBP and function.   Goal status: INITIAL   4. Pt will be able to lift/squat/hold >25 lbs in order to demonstrate functional improvement in lumbopelvic/ knee strength for return ADL and occupational duties.  Goal status: INITIAL     PLAN:   PT FREQUENCY: 1-2x/week   PT DURATION: 12 weeks   PLANNED INTERVENTIONS: Therapeutic exercises, Therapeutic activity, Neuromuscular re-education, Balance training, Gait training, Patient/Family education, Self Care, Joint mobilization, Joint manipulation, Stair training, Prosthetic training, DME instructions, Aquatic Therapy, Dry Needling, Electrical stimulation, Spinal manipulation, Spinal mobilization, Moist heat, scar mobilization, Splintting, Taping, Vasopneumatic device, Traction, Ultrasound, Ionotophoresis 4mg /ml Dexamethasone , Manual therapy, and Re-evaluation   PLAN FOR NEXT SESSION: consider nerve glide, joint mobilizations for extension, progressive flexion, abdominal strength   Almedia Jacobsen, PTA 01/15/24 1:34 PM [facility]  [facility] [address] [place], [place], [address] Phone: [phone]   Fax:  [phone]

## 2024-01-21 ENCOUNTER — Ambulatory Visit (HOSPITAL_BASED_OUTPATIENT_CLINIC_OR_DEPARTMENT_OTHER): Payer: PRIVATE HEALTH INSURANCE | Admitting: Physical Therapy

## 2024-01-21 ENCOUNTER — Encounter (HOSPITAL_BASED_OUTPATIENT_CLINIC_OR_DEPARTMENT_OTHER): Payer: Self-pay | Admitting: Physical Therapy

## 2024-01-21 DIAGNOSIS — M6281 Muscle weakness (generalized): Secondary | ICD-10-CM

## 2024-01-21 DIAGNOSIS — M545 Low back pain, unspecified: Secondary | ICD-10-CM

## 2024-01-21 DIAGNOSIS — R262 Difficulty in walking, not elsewhere classified: Secondary | ICD-10-CM

## 2024-01-21 NOTE — Therapy (Signed)
 OUTPATIENT PHYSICAL THERAPY THORACOLUMBAR TREATMENT   Patient Name: Lori Cameron MRN: [id number] DOB:Feb 02, 1976, 48 y.o., female Today's Date: 01/21/2024  END OF SESSION:  PT End of Session - 01/21/24 0809     Visit Number 3    Number of Visits 19    Authorization Type [employer]    PT Start Time 0804    PT Stop Time 0843    PT Time Calculation (min) 39 min    Activity Tolerance Patient tolerated treatment well    Behavior During Therapy [facility] for tasks assessed/performed             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   C&B/ LEEP AUGUST 2010 WITH CIN 1 AND MARGINS FREE   Hypertension    PONV (postoperative nausea and vomiting)    Past Surgical History:  Procedure Laterality Date   ABDOMINAL SURGERY     LAPAROTOMY APPENDECTOMY, LYSIS OF ADHESIONS   APPENDECTOMY     CERVICAL BIOPSY  W/ LOOP ELECTRODE EXCISION     CINI MARGINS FREE   DILITATION & CURRETTAGE/HYSTROSCOPY WITH HYDROTHERMAL ABLATION N/A 02/23/2020   Procedure: DILATATION & CURETTAGE/HYSTEROSCOPY WITH HYDROTHERMAL ABLATION;  Surgeon: Johnn Najjar, MD;  Location: [facility];  Service: Gynecology;  Laterality: N/A;   INTRAUTERINE DEVICE (IUD) INSERTION N/A 02/23/2020   Procedure: INTRAUTERINE DEVICE (IUD) INSERTION MARENA;  Surgeon: Johnn Najjar, MD;  Location: [facility];  Service: Gynecology;  Laterality: N/A;   MEDIAL PARTIAL KNEE REPLACEMENT Right 11/2021   TONSILLECTOMY AND ADENOIDECTOMY     Patient Active Problem List   Diagnosis Date Noted   Primary osteoarthritis of both knees 03/13/2021   Polyarthralgia 01/19/2021   Chronic right SI joint pain 09/01/2019   Fibroid uterus 01/07/2018   Left  lumbar radiculitis 07/12/2013   Depression    Asthma 03/09/2011    PCP: Merl Star, MD   REFERRING PROVIDER:   Diedra Fowler, MD    REFERRING DIAG:  Diagnosis  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  Pt reports she has been working on her body mechanics for transitional movements.  Pt reports she would like to be able to clean her house again.    Initial evaluation:  Pt first hurt her back 20 yrs ago transferring a pt. She has had on/off back pain since then. Pt reports since Oct, she has had more radiating pain in the R LE.  Pt reports long term history of back pain even with last round of PT for her knees. 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 Pt has had PT for the low back before as well as aquatic for the R knee. Pt with some relief with cortisone. Pt locations pain more to the R hip rather than the back. 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 Pt has trouble sleeping and getting in and out of bed. Denies cancer red flags.    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 Yes: NPRS scale: 2/10  Pain location: lower back to bilat lateral hip (R>L) Pain description: deep, sharp pain  Aggravating factors: bending down, coughing, sneezing Relieving factors: nothing provides relief;  Aleve  no longer provides relief.   PRECAUTIONS: None   WEIGHT BEARING RESTRICTIONS: No   FALLS:  Has patient fallen in last 6 months? 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  DIAGNOSTIC FINDINGS:  MPRESSION: Degenerative changes at L5-S1 which are significantly increased since the prior MRI. Disc bulge and facet arthrosis at L5-S1 resulting in moderate left and mild-to-moderate right foraminal stenosis. There is mild lateral recess narrowing at L5-S1 without significant impingement upon the traversing nerve roots.   Prominent degenerative endplate changes and discogenic edema at L5-S1 which may contribute to back pain.   Findings suggestive of fibroid uterus. 3.2 cm cyst in the pelvis, likely adnexal cyst.   Electronically Signed   By: Denny Flack M.D.   On: 12/05/2023 12:12  PATIENT SURVEYS:  Oswestry ODI Score = 25 points (50%)  Interpretation: 41% to 60% (severe disability): Pain is a primary problem for these patients, but they may also be experiencing significant problems in travel, personal care, social life, sexual activity and sleep. A detailed evaluation is appropriate.   COGNITION: Overall cognitive status: Within functional limits for tasks assessed     SENSATION: WFL  POSTURE: No Significant postural limitations  PALPATION: TTP of bilat lumbar paraspinals, QL, glutes, SIJ borders with R >L  Stiffness and tightness with R UPA  LUMBAR ROM:   AROM eval  Flexion 75% p!  Gower's sign  Extension 100%  Right lateral flexion 100%  Left lateral flexion 100%  Right  rotation 100%  Left rotation 100%   (Blank rows = not tested)  LOWER EXTREMITY ROM:  no pain during  Passive  Right eval Left eval  Hip flexion [facility]  Hip extension [facility]  Hip abduction    Hip adduction    Hip internal rotation [facility]  Hip external rotation [facility]  Knee flexion    Knee extension    Ankle dorsiflexion    Ankle plantarflexion    Ankle inversion    Ankle eversion     (Blank rows = not tested)  LOWER EXTREMITY MMT:  no myotomal weakness note; painful with all motions  MMT Right eval Left eval  Hip flexion 4/5 4+/5  Hip extension    Hip abduction 4/5 4/5  Hip adduction 4+/5 4+/5  Hip internal rotation 4/5 4/5  Hip external rotation 4/5 4/5  Knee flexion    Knee extension    Ankle dorsiflexion    Ankle plantarflexion    Ankle inversion    Ankle eversion     (Blank rows = not tested)  LUMBAR SPECIAL TESTS:  Straight leg raise test: Negative, Slump test: Positive, SI Compression/distraction test: Positive, and FABER test: Positive  FUNCTIONAL TESTS:  Supine to sit: painful with rolling and transition to seated  STS requires UE due to pain  GAIT: Distance walked: 48ft Assistive device utilized: None Level of assistance: Complete Independence Comments: WFL, increased lumbar lordosis  TREATMENT DATE:  [facility] Adult PT Treatment:                                                DATE: 01/21/24  Therapeutic Exercise: NuStep, L5, UE/LE x 5 min  Seated piriformis and glute stretch x 15s x 2 Seated hamstring stretch x 15s  Standing bilat shoulder ext with TrA set, 3s hold x 10, yellow band Standing bilat mid-row with green band 2 x 10 Pallof press with yellow (step out, reach out/in, step in) x 10 each Seated forward swiss ball rolls for lumbar stretch  Therapeutic Activity: STS x 6 with forward arm reach, and slow controlled descent Squat to pick up cone off of 8" step and place it on shelf to simulate unloading dishwasher   [facility] Adult PT  Treatment:                                                DATE: 01/15/24  Therapeutic Exercise: TrA set with lap press in seated position, 5 sec hold, cues to breathe In hooklying:  rocking knees R/L;  R/L piriformis stretch pulling towards opp shoulder x15s x 2 each LE;  R/L hamstring stretch/ITB/adductor with strap x 15s x 2 reps each position, each LE  Therapeutic Activity: STS with TrA set x 6 Sit to/from supine via log roll Seated scoot forward/backward with weight shift and TrA set Self Care: Instructed on freq / application of TENS (already has unit) and reminded her of ice parameters  PATIENT EDUCATION:  Education details: rationale for intervention and exercise,  01/21/24: issued yellow and green band  Person educated: Patient Education method: Explanation, Demonstration, Tactile cues, Verbal cues, Education comprehension: verbalized understanding, returned demonstration, verbal cues required, and tactile cues required   HOME EXERCISE PROGRAM:   Access Code: [email or web address] URL: [email or web address]    ASSESSMENT:   CLINICAL IMPRESSION: Positive response to  abdominal bracing with transitional movements. Less pain after engaging core musculature. Updated HEP and issued bands.  Will continue spinal stabilization exercises and core strengthening. Continue posture and Optometrist education. Good tolerance for today's exercises without increase in pain, occasional twinges if too much lumbar ext.  Goals are ongoing.     Initial eval:  Patient is a 48 y.o. female who was seen today for physical therapy evaluation and treatment for c/c of LBP. Pt's s/s appear consistent with low back pain with concurrent SIJ sensitivity and radicular symptoms. Clinical testing does not recreate distal symptoms and  does not show myotomal weakness.  Pt's pain is highly sensitive and irritable with movement. Pt does appears to have an extension preference despite imaging results. Pt would  benefit from continued skilled therapy in order to reach goals and maximize functional lumbopelvic strength and ROM for full return to PLOF.      OBJECTIVE IMPAIRMENTS: Abnormal gait, decreased activity tolerance, decreased balance, decreased endurance, decreased mobility, difficulty walking, decreased ROM, decreased strength, hypomobility, impaired flexibility, improper body mechanics, postural dysfunction, and pain.    ACTIVITY LIMITATIONS: carrying, lifting, bending, standing, squatting, stairs, transfers, bed mobility, and locomotion level   PARTICIPATION LIMITATIONS: meal prep, cleaning, laundry, driving, shopping, and community activity and exercise   PERSONAL FACTORS: Age, Fitness, Time since onset of injury/illness/exacerbation, and 2+ co morbidities:   are also affecting patient's functional outcome.    REHAB POTENTIAL: Fair     CLINICAL DECISION MAKING: Evolving/moderate complexity   EVALUATION COMPLEXITY: Moderate     GOALS:     SHORT TERM GOALS: Target date: 02/02/2024        Pt will become independent with HEP in order to demonstrate synthesis of PT education.    Goal status: INITIAL   2.  Pt will report at least 2 pt reduction on NPRS scale for pain in order to demonstrate functional improvement with household activity, self care, and ADL.    Goal status: INITIAL         LONG TERM GOALS: Target date: 03/15/2024    Pt  will become independent with final HEP in order to demonstrate synthesis of PT education.    Goal status: INITIAL   2.  Pt will be able to demonstrate/report ability to sit/stand/sleep for extended periods of time without pain in order to demonstrate functional improvement and tolerance to static positioning.    Goal status: INITIAL   3. Pt will demonstrate at least a 12.8 improvement in Oswestry Index in order to demonstrate a clinically significant change in LBP and function.   Goal status: INITIAL   4. Pt will be able to lift/squat/hold  >25 lbs in order to demonstrate functional improvement in lumbopelvic/ knee strength for return ADL and occupational duties.  Goal status: INITIAL     PLAN:   PT FREQUENCY: 1-2x/week   PT DURATION: 12 weeks   PLANNED INTERVENTIONS: Therapeutic exercises, Therapeutic activity, Neuromuscular re-education, Balance training, Gait training, Patient/Family education, Self Care, Joint mobilization, Joint manipulation, Stair training, Prosthetic training, DME instructions, Aquatic Therapy, Dry Needling, Electrical stimulation, Spinal manipulation, Spinal mobilization, Moist heat, scar mobilization, Splintting, Taping, Vasopneumatic device, Traction, Ultrasound, Ionotophoresis 4mg /ml Dexamethasone , Manual therapy, and Re-evaluation   PLAN FOR NEXT SESSION: consider nerve glide, joint mobilizations for extension, progressive flexion, abdominal strength   Almedia Jacobsen, PTA 01/21/24 1:07 PM [facility] [address] [place], [place], [address] Phone: [phone]   Fax:  [phone]

## 2024-01-26 ENCOUNTER — Encounter (HOSPITAL_BASED_OUTPATIENT_CLINIC_OR_DEPARTMENT_OTHER): Payer: PRIVATE HEALTH INSURANCE | Admitting: Physical Therapy

## 2024-01-28 ENCOUNTER — Encounter (HOSPITAL_BASED_OUTPATIENT_CLINIC_OR_DEPARTMENT_OTHER): Payer: PRIVATE HEALTH INSURANCE | Admitting: Physical Therapy

## 2024-01-29 ENCOUNTER — Encounter: Payer: Self-pay | Admitting: Physical Medicine and Rehabilitation

## 2024-01-29 ENCOUNTER — Ambulatory Visit (INDEPENDENT_AMBULATORY_CARE_PROVIDER_SITE_OTHER): Payer: PRIVATE HEALTH INSURANCE | Admitting: Physical Medicine and Rehabilitation

## 2024-01-29 DIAGNOSIS — G8929 Other chronic pain: Secondary | ICD-10-CM | POA: Diagnosis not present

## 2024-01-29 DIAGNOSIS — M47816 Spondylosis without myelopathy or radiculopathy, lumbar region: Secondary | ICD-10-CM | POA: Diagnosis not present

## 2024-01-29 DIAGNOSIS — M545 Low back pain, unspecified: Secondary | ICD-10-CM

## 2024-01-29 NOTE — Progress Notes (Signed)
 Pain Scale   Average Pain 7 Patient advising she has Chronic lower back pain and is wanting to evaluate her options SCS..        +Driver, -BT, -Dye Allergies.

## 2024-01-29 NOTE — Progress Notes (Signed)
 Lori Cameron - 48 y.o. female MRN [id number]  Date of birth: 11/28/1975  Office Visit Note: Visit Date: 01/29/2024 PCP: Merl Star, MD Referred by: Merl Star, MD  Subjective: Chief Complaint  Patient presents with   Lower Back - Pain   HPI: Lori Cameron is a 48 y.o. female who comes in today per the request of Dr. Colette Davies for evaluation of chronic, worsening and severe bilateral lower back pain, some referral to lateral hips, right greater than left. She is here today to discuss possible treatments/spinal cord stimulation. 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 Her pain became worse in October of 2024, at that time she experienced bilateral lower back and radicular symptoms radiating down right leg. Right sided radicular symptoms have since resolved with the help of muscle relaxer medications and oral steroids. Overall, her lower back symptoms increased after undergoing partial right knee replacement in April of 2024. Her pain worsens with sitting, laying down to sleep. Her pain is most severe in the mornings after waking up. She describes pain as stiff, tight and sore sensation, currently rates as 7 out of 10. Some relief of pain with home exercise regimen, rest and use of medications. History of formal physical therapy that she feels made her pain worse.   Lumbar MRI imaging from March of this year shows prominent degenerative endplate changes and discogenic edema at L5-S1 which may contribute to back pain.There is also foraminal and lateral recess stenosis at this level. She underwent right L5-S1 epidural steroid injection in our office on 12/09/2023, no relief of pain with this procedure.   I did review Dr. Frieda Jew notes, he had a long discussion with patent regarding L5-S1 fusion and options for treatment such as chronic pain management and spinal cord stimulation.   She was recently evaluated by Dr. Monita Anon with [facility]. Per her notes, she did recommend continued formal physical therapy and possible facet joint injections/radiofrequency ablation.   Patient denies focal weakness, numbness and tingling. No recent trauma or falls.         Review of Systems  Musculoskeletal:  Positive for back pain.  Neurological:  Negative for tingling, sensory change, focal weakness and weakness.  All other systems reviewed and are negative.  Otherwise per HPI.  Assessment & Plan: Visit Diagnoses:    ICD-10-CM   1. Chronic midline low back pain without sciatica  M54.50 Ambulatory referral to Physical Medicine Rehab   G89.29     2. Spondylosis without myelopathy or radiculopathy, lumbar region  M47.816 Ambulatory referral to Physical Medicine Rehab    3. Facet arthropathy, lumbar  M47.816 Ambulatory referral to Physical Medicine Rehab       Plan: Findings:  Chronic, worsening and severe bilateral lower back pain, some referral to lateral hips, right greater than left. No radicular symptoms down the legs. Patient continues to have severe pain despite good conservative therapies such as formal physical therapy, home exercise regimen, rest and use of medications. We discussed treatment plan in detail including possible options such as spinal cord stimulation, chronic pain management and possible medial branch blocks followed by radiofrequency ablation. She would like to hold on any type of surgical intervention at this time. She is also very hesitant to move forward with spinal cord stimulation. She is interested in moving forward with injection therapy. I discussed medial branch blocks and possible longer sustained pain relief with ablation. There are significant degenerative changes  and facet arthropathy noted at the level of L5-S1. I placed order for diagnostic bilateral L5-S1 facet joint/medial branch blocks under fluoroscopic guidance. If good relief of pain with double diagnostic blocks we will proceed with  radiofrequency ablation. I would like for her to continue with physical therapy, could look at chronic pain management in the future. She has no questions at this time and was able to verbalize understanding. No red flag symptoms noted upon exam today.     Meds & Orders: No orders of the defined types were placed in this encounter.   Orders Placed This Encounter  Procedures   Ambulatory referral to Physical Medicine Rehab    Follow-up: Return for Bilateral L5-S1 facet joint/medial branch blocks.   Procedures: No procedures performed      Clinical History: MRI LUMBAR SPINE WITHOUT CONTRAST   TECHNIQUE: Multiplanar, multisequence MR imaging of the lumbar spine was performed. No intravenous contrast was administered.   COMPARISON:  MRI lumbar spine 02/06/2015.   FINDINGS: Segmentation:  Standard.   Alignment: Lumbar lordosis is maintained. No significant listhesis.   Vertebrae: Prominent Modic type 1 degenerative endplate changes at L5-S1 with associated discogenic edema which is new since the prior study. Vertebral body heights are maintained. No additional bone marrow edema or evidence of acute fracture.   Conus medullaris and cauda equina: Conus extends to the T12-L1 level. Conus and cauda equina appear normal.   Paraspinal and other soft tissues: Visualized paraspinal soft tissues are unremarkable. Prominence of the partially visualized uterus with suggestion of possible fibroids. Additional 3.2 cm cyst in the pelvis right of midline suggestive of adnexal cyst.   Disc levels:   T12-L1: No significant disc bulge. No significant spinal canal stenosis or foraminal stenosis.   L1-2: No significant disc bulge. No significant spinal canal stenosis or foraminal stenosis.   L2-3: No significant disc bulge. No significant spinal canal stenosis or foraminal stenosis.   L3-4: No significant disc bulge. No significant spinal canal stenosis or foraminal stenosis.   L4-5:  No significant disc bulge. No significant spinal canal stenosis or foraminal stenosis.   L5-S1: Disc desiccation and moderate disc height loss. Small disc bulge which indents the ventral thecal sac resulting in narrowing of the lateral recesses without significant impingement upon the traversing nerve roots. Mild facet arthrosis. There is mild-to-moderate right and moderate left foraminal stenosis.   IMPRESSION: Degenerative changes at L5-S1 which are significantly increased since the prior MRI. Disc bulge and facet arthrosis at L5-S1 resulting in moderate left and mild-to-moderate right foraminal stenosis. There is mild lateral recess narrowing at L5-S1 without significant impingement upon the traversing nerve roots.   Prominent degenerative endplate changes and discogenic edema at L5-S1 which may contribute to back pain.   Findings suggestive of fibroid uterus. 3.2 cm cyst in the pelvis, likely adnexal cyst.     Electronically Signed   By: Denny Flack M.D.   On: 12/05/2023 12:12   She reports that she has quit smoking. She has been exposed to tobacco smoke. She has never used smokeless tobacco.  Recent Labs    04/01/23 1008  HGBA1C 5.9*    Objective:  VS:  HT:    WT:   BMI:     BP:   HR: bpm  TEMP: ( )  RESP:  Physical Exam Vitals and nursing note reviewed.  HENT:     Head: Normocephalic and atraumatic.     Right Ear: External ear normal.     Left Ear:  External ear normal.     Nose: Nose normal.     Mouth/Throat:     Mouth: Mucous membranes are moist.  Eyes:     Extraocular Movements: Extraocular movements intact.  Cardiovascular:     Rate and Rhythm: Normal rate.     Pulses: Normal pulses.  Pulmonary:     Effort: Pulmonary effort is normal.  Abdominal:     General: Abdomen is flat. There is no distension.  Musculoskeletal:        General: Tenderness present.     Cervical back: Normal range of motion.     Comments: Patient rises from seated position  to standing without difficulty. Pain noted with facet loading and lumbar extension. 5/5 strength noted with bilateral hip flexion, knee flexion/extension, ankle dorsiflexion/plantarflexion and EHL. No clonus noted bilaterally. No pain upon palpation of greater trochanters. No pain with internal/external rotation of bilateral hips. Sensation intact bilaterally. Negative slump test bilaterally. Ambulates without aid, gait steady.     Skin:    General: Skin is warm and dry.     Capillary Refill: Capillary refill takes less than 2 seconds.  Neurological:     General: No focal deficit present.     Mental Status: She is alert and oriented to person, place, and time.  Psychiatric:        Mood and Affect: Mood normal.        Behavior: Behavior normal.     Ortho Exam  Imaging: No results found.  Past Medical/Family/Surgical/Social History: Medications & Allergies reviewed per EMR, new medications updated. 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   C&B/ LEEP AUGUST 2010 WITH CIN 1 AND MARGINS FREE   Hypertension    PONV (postoperative nausea and vomiting)    Family History  Problem Relation Age of Onset   Diabetes Mother    Hypertension Mother    Cervical cancer Mother    Arthritis Mother    Healthy Sister    Healthy Sister    Healthy Brother    Healthy Brother    Colon cancer Maternal Aunt    Hypertension Maternal Grandmother    Heart disease  Maternal Grandmother    BRCA 1/2 Neg Hx    Breast cancer Neg Hx    Past Surgical History:  Procedure Laterality Date   ABDOMINAL SURGERY     LAPAROTOMY APPENDECTOMY, LYSIS OF ADHESIONS   APPENDECTOMY     CERVICAL BIOPSY  W/ LOOP ELECTRODE EXCISION     CINI MARGINS FREE   DILITATION & CURRETTAGE/HYSTROSCOPY WITH HYDROTHERMAL ABLATION N/A 02/23/2020   Procedure: DILATATION & CURETTAGE/HYSTEROSCOPY WITH HYDROTHERMAL ABLATION;  Surgeon: Johnn Najjar, MD;  Location: [facility];  Service: Gynecology;  Laterality: N/A;   INTRAUTERINE DEVICE (IUD) INSERTION N/A 02/23/2020   Procedure: INTRAUTERINE DEVICE (IUD) INSERTION MARENA;  Surgeon: Johnn Najjar, MD;  Location: [facility];  Service: Gynecology;  Laterality: N/A;  MEDIAL PARTIAL KNEE REPLACEMENT Right 11/2021   TONSILLECTOMY AND ADENOIDECTOMY     Social History   Occupational History   Not on file  Tobacco Use   Smoking status: Former    Passive exposure: Current   Smokeless tobacco: Never  Vaping Use   Vaping status: Never Used  Substance and Sexual Activity   Alcohol use: Yes    Alcohol/week: 5.0 standard drinks of alcohol    Types: 5 Standard drinks or equivalent per week    Comment: occ   Drug use: No   Sexual activity: Yes    Partners: Male    Birth control/protection: Condom    Comment: 1st intercourse 55 yo-5 partners

## 2024-01-30 ENCOUNTER — Ambulatory Visit (HOSPITAL_BASED_OUTPATIENT_CLINIC_OR_DEPARTMENT_OTHER): Payer: PRIVATE HEALTH INSURANCE | Attending: Orthopedic Surgery | Admitting: Physical Therapy

## 2024-01-30 ENCOUNTER — Telehealth (HOSPITAL_BASED_OUTPATIENT_CLINIC_OR_DEPARTMENT_OTHER): Payer: Self-pay | Admitting: Physical Therapy

## 2024-01-30 DIAGNOSIS — R262 Difficulty in walking, not elsewhere classified: Secondary | ICD-10-CM | POA: Insufficient documentation

## 2024-01-30 DIAGNOSIS — M545 Low back pain, unspecified: Secondary | ICD-10-CM | POA: Insufficient documentation

## 2024-01-30 DIAGNOSIS — M6281 Muscle weakness (generalized): Secondary | ICD-10-CM | POA: Insufficient documentation

## 2024-01-30 NOTE — Telephone Encounter (Signed)
 Patient did not show for PT today.  Called and left voice mail regarding missed appointment at [facility].  Requested patient return call to confirm next upcoming appointment for June 10, 8:45am.     Almedia Jacobsen, PTA 01/30/24 9:08 AM [facility] Phone: [phone]

## 2024-02-02 ENCOUNTER — Encounter (HOSPITAL_BASED_OUTPATIENT_CLINIC_OR_DEPARTMENT_OTHER): Payer: PRIVATE HEALTH INSURANCE | Admitting: Physical Therapy

## 2024-02-03 ENCOUNTER — Ambulatory Visit (HOSPITAL_BASED_OUTPATIENT_CLINIC_OR_DEPARTMENT_OTHER): Payer: PRIVATE HEALTH INSURANCE | Admitting: Physical Therapy

## 2024-02-04 ENCOUNTER — Encounter (HOSPITAL_BASED_OUTPATIENT_CLINIC_OR_DEPARTMENT_OTHER): Payer: PRIVATE HEALTH INSURANCE | Admitting: Physical Therapy

## 2024-02-09 ENCOUNTER — Encounter (HOSPITAL_BASED_OUTPATIENT_CLINIC_OR_DEPARTMENT_OTHER): Payer: PRIVATE HEALTH INSURANCE | Admitting: Physical Therapy

## 2024-02-10 ENCOUNTER — Ambulatory Visit (HOSPITAL_BASED_OUTPATIENT_CLINIC_OR_DEPARTMENT_OTHER): Payer: PRIVATE HEALTH INSURANCE | Admitting: Physical Therapy

## 2024-02-11 ENCOUNTER — Encounter (HOSPITAL_BASED_OUTPATIENT_CLINIC_OR_DEPARTMENT_OTHER): Payer: PRIVATE HEALTH INSURANCE | Admitting: Physical Therapy

## 2024-02-13 ENCOUNTER — Encounter (HOSPITAL_BASED_OUTPATIENT_CLINIC_OR_DEPARTMENT_OTHER): Payer: Self-pay | Admitting: Physical Therapy

## 2024-02-13 ENCOUNTER — Ambulatory Visit (HOSPITAL_BASED_OUTPATIENT_CLINIC_OR_DEPARTMENT_OTHER): Payer: PRIVATE HEALTH INSURANCE | Admitting: Physical Therapy

## 2024-02-13 DIAGNOSIS — R262 Difficulty in walking, not elsewhere classified: Secondary | ICD-10-CM

## 2024-02-13 DIAGNOSIS — M6281 Muscle weakness (generalized): Secondary | ICD-10-CM | POA: Diagnosis present

## 2024-02-13 DIAGNOSIS — M545 Low back pain, unspecified: Secondary | ICD-10-CM

## 2024-02-13 NOTE — Therapy (Signed)
 OUTPATIENT PHYSICAL THERAPY THORACOLUMBAR TREATMENT   Patient Name: Lori Cameron MRN: [id number] DOB:15-Jun-1976, 48 y.o., female Today's Date: 02/13/2024  END OF SESSION:  PT End of Session - 02/13/24 1604     Visit Number 4    Number of Visits 19    Authorization Type [employer]    PT Start Time 1600    PT Stop Time 1638    PT Time Calculation (min) 38 min    Behavior During Therapy WFL for tasks assessed/performed          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   C&B/ LEEP AUGUST 2010 WITH CIN 1 AND MARGINS FREE   Hypertension    PONV (postoperative nausea and vomiting)    Past Surgical History:  Procedure Laterality Date   ABDOMINAL SURGERY     LAPAROTOMY APPENDECTOMY, LYSIS OF ADHESIONS   APPENDECTOMY     CERVICAL BIOPSY  W/ LOOP ELECTRODE EXCISION     CINI MARGINS FREE   DILITATION & CURRETTAGE/HYSTROSCOPY WITH HYDROTHERMAL ABLATION N/A 02/23/2020   Procedure: DILATATION & CURETTAGE/HYSTEROSCOPY WITH HYDROTHERMAL ABLATION;  Surgeon: Johnn Najjar, MD;  Location: [facility];  Service: Gynecology;  Laterality: N/A;   INTRAUTERINE DEVICE (IUD) INSERTION N/A 02/23/2020   Procedure: INTRAUTERINE DEVICE (IUD) INSERTION MARENA;  Surgeon: Johnn Najjar, MD;  Location: [facility];  Service: Gynecology;  Laterality: N/A;   MEDIAL PARTIAL KNEE REPLACEMENT Right 11/2021   TONSILLECTOMY AND ADENOIDECTOMY     Patient Active Problem List   Diagnosis Date Noted   Primary osteoarthritis of both knees 03/13/2021   Polyarthralgia 01/19/2021   Chronic right SI joint pain 09/01/2019   Fibroid uterus 01/07/2018   Left lumbar radiculitis 07/12/2013   Depression    Asthma  03/09/2011    PCP: Merl Star, MD   REFERRING PROVIDER:   Diedra Fowler, MD    REFERRING DIAG:  Diagnosis  M51.360 (ICD-10-CM) - Discogenic low back pain    Rationale for Evaluation and Treatment: Rehabilitation  THERAPY DIAG:  Pain, lumbar region  Difficulty walking  Muscle weakness (generalized)  ONSET DATE: 10+ yrs  SUBJECTIVE:  SUBJECTIVE STATEMENT: Pt reports that her pain spiked after last land session.  She is scheduled for 2 injections prior to ablation. First one is scheduled for 7/17.      Initial evaluation:  Pt first hurt her back 20 yrs ago transferring a pt. She has had on/off back pain since then. Pt reports since Oct, she has had more radiating pain in the R LE.  Pt reports long term history of back pain even with last round of PT for her knees. 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 Pt has had PT for the low back before as well as aquatic for the R knee. Pt with some relief with cortisone. Pt locations pain more to the R hip rather than the back. 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 Pt has trouble sleeping and getting in and out of bed. Denies cancer red flags.    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 Yes: NPRS scale: 8/10  Pain location: lower back to bilat lateral hip (R>L) Pain description: deep, sharp pain  Aggravating factors: bending down, coughing, sneezing Relieving factors: nothing provides relief; Aleve  no longer provides relief.   PRECAUTIONS: None   WEIGHT BEARING RESTRICTIONS: No   FALLS:  Has  patient fallen in last 6 months? 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  DIAGNOSTIC FINDINGS:  MPRESSION: Degenerative changes at L5-S1 which are significantly increased since the prior MRI. Disc bulge and facet arthrosis at L5-S1 resulting in moderate left and mild-to-moderate right foraminal stenosis. There is mild lateral recess narrowing at L5-S1 without significant impingement upon the traversing nerve roots.   Prominent degenerative endplate changes and discogenic edema at L5-S1 which may contribute to back pain.   Findings suggestive of fibroid uterus. 3.2 cm cyst in the pelvis, likely adnexal cyst.   Electronically Signed   By: Denny Flack M.D.   On: 12/05/2023 12:12  PATIENT SURVEYS:  Oswestry ODI Score = 25 points (50%)  Interpretation: 41% to 60% (severe disability): Pain is a primary problem for these patients, but they may also be experiencing significant problems in travel, personal care, social life, sexual activity and sleep. A detailed evaluation is appropriate.   COGNITION: Overall cognitive status: Within functional limits for tasks assessed     SENSATION: WFL  POSTURE: No Significant postural limitations  PALPATION: TTP of bilat lumbar paraspinals, QL, glutes, SIJ borders with R >L  Stiffness and tightness with R UPA  LUMBAR ROM:   AROM eval  Flexion 75% p!  Gower's sign  Extension 100%  Right lateral flexion 100%  Left lateral flexion 100%  Right rotation 100%  Left rotation 100%   (Blank rows = not tested)  LOWER EXTREMITY ROM:   no pain  during  Passive  Right eval Left  eval  Hip flexion [facility]  Hip extension [facility]  Hip abduction    Hip adduction    Hip internal rotation [facility] WFL  Hip external rotation [facility]  Knee flexion    Knee extension    Ankle dorsiflexion    Ankle plantarflexion    Ankle inversion    Ankle eversion     (Blank rows = not tested)  LOWER EXTREMITY MMT:  no myotomal weakness note; painful with all motions  MMT Right eval Left eval  Hip flexion 4/5 4+/5  Hip extension    Hip abduction 4/5 4/5  Hip adduction 4+/5 4+/5  Hip internal rotation 4/5 4/5  Hip external rotation 4/5 4/5  Knee flexion    Knee extension    Ankle dorsiflexion    Ankle plantarflexion    Ankle inversion    Ankle eversion     (Blank rows = not tested)  LUMBAR SPECIAL TESTS:  Straight leg raise test: Negative, Slump test: Positive, SI Compression/distraction test: Positive, and FABER test: Positive  FUNCTIONAL TESTS:  Supine to sit: painful with rolling and transition to seated  STS requires UE due to pain  GAIT: Distance walked: 10ft Assistive device utilized: None Level of assistance: Complete Independence Comments: WFL, increased lumbar lordosis  TREATMENT DATE:  [facility] Adult PT Treatment:                                             02/13/24 Pt seen for aquatic therapy today.  Treatment took place in water 3.5-4.75 ft in depth at the [facility] pool. Temp of water was 91.  Pt entered/exited the pool via stairs independently with bilat rail.  - Intro to aquatic therapy principles - unsupported walking forward/ backward - unsupported side stepping  - UE on wall:  heel raises; hip add/abd; relaxed squat - straddling noodle with UE  support on corner: cycling, suspended hip abdct/ addct and hip flex/ext  - return to walking forward / backward  - at bench in water: STS with neutral head, core engaged, hip hinge x 8 - reviewed mechanics for stand to supine on floor (visual demo provided on  deck) -after dried off: applied reg Rock tape to bilat lumbar paraspinals and perpendicular piece across beltline to increase proprioception/postural awareness.pt instructed in safe tape removal technique  Pt requires the buoyancy and hydrostatic pressure of water for support, and to offload joints by unweighting joint load by at least 50 % in navel deep water and by at least 75-80% in chest to neck deep water.  Viscosity of the water is needed for resistance of strengthening. Water current perturbations provides challenge to standing balance requiring increased core activation.      [facility] Adult PT Treatment:                                                DATE: 01/21/24  Therapeutic Exercise: NuStep, L5, UE/LE x 5 min  Seated piriformis and glute stretch x 15s x 2 Seated hamstring stretch x 15s  Standing bilat shoulder ext with TrA set, 3s hold x 10, yellow band Standing bilat mid-row with green band 2 x 10 Pallof press with yellow (step out, reach out/in, step in) x 10 each  Seated forward swiss ball rolls for lumbar stretch  Therapeutic Activity: STS x 6 with forward arm reach, and slow controlled descent Squat to pick up cone off of 8 step and place it on shelf to simulate unloading dishwasher   [facility] Adult PT Treatment:                                                DATE: 01/15/24  Therapeutic Exercise: TrA set with lap press in seated position, 5 sec hold, cues to breathe In hooklying:  rocking knees R/L;  R/L piriformis stretch pulling towards opp shoulder x15s x 2 each LE;  R/L hamstring stretch/ITB/adductor with strap x 15s x 2 reps each position, each LE  Therapeutic Activity: STS with TrA set x 6 Sit to/from supine via log roll Seated scoot forward/backward with weight shift and TrA set Self Care: Instructed on freq / application of TENS (already has unit) and reminded her of ice parameters  PATIENT EDUCATION:  Education details: rationale for intervention and exercise,   01/21/24: issued yellow and green band  Person educated: Patient Education method: Explanation, Demonstration, Tactile cues, Verbal cues, Education comprehension: verbalized understanding, returned demonstration, verbal cues required, and tactile cues required   HOME EXERCISE PROGRAM:   Access Code: [email or web address] URL: [email or web address]    ASSESSMENT:   CLINICAL IMPRESSION: Positive response to aquatic therapy session with complete elimination of pain when submerged in pool.  Pt has partially met her goals. Will begin to show her exercises that she can complete on own at [facility] pool outside of therapy sessions to continue to have relief of symptoms.     Initial eval:  Patient is a 48 y.o. female who was seen today for physical therapy evaluation and treatment for c/c of LBP. Pt's s/s appear consistent with low back pain with concurrent SIJ sensitivity and radicular symptoms. Clinical testing does not recreate distal symptoms and  does not show myotomal weakness.  Pt's pain is highly sensitive and irritable with movement. Pt does appears to have an extension preference despite imaging results. Pt would benefit from continued skilled therapy in order to reach goals and maximize functional lumbopelvic strength and ROM for full return to PLOF.      OBJECTIVE IMPAIRMENTS: Abnormal gait, decreased activity tolerance, decreased balance, decreased endurance, decreased mobility, difficulty walking, decreased ROM, decreased strength, hypomobility, impaired flexibility, improper body mechanics, postural dysfunction, and pain.    ACTIVITY LIMITATIONS: carrying, lifting, bending, standing, squatting, stairs, transfers, bed mobility, and locomotion level   PARTICIPATION LIMITATIONS: meal prep, cleaning, laundry, driving, shopping, and community activity and exercise   PERSONAL FACTORS: Age, Fitness, Time since onset of injury/illness/exacerbation, and 2+ co morbidities:   are also  affecting patient's functional outcome.    REHAB POTENTIAL: Fair     CLINICAL DECISION MAKING: Evolving/moderate complexity   EVALUATION COMPLEXITY: Moderate     GOALS:     SHORT TERM GOALS: Target date: 02/02/2024        Pt will become independent with HEP in order to demonstrate synthesis of PT education.    Goal status: Partially met 02/13/24   2.  Pt will report at least 2 pt reduction on NPRS scale for pain in order to demonstrate functional improvement with household activity, self care, and ADL.    Goal status:Partially met 02/13/24       LONG TERM  GOALS: Target date: 03/15/2024    Pt  will become independent with final HEP in order to demonstrate synthesis of PT education.    Goal status: INITIAL   2.  Pt will be able to demonstrate/report ability to sit/stand/sleep for extended periods of time without pain in order to demonstrate functional improvement and tolerance to static positioning.    Goal status: INITIAL   3. Pt will demonstrate at least a 12.8 improvement in Oswestry Index in order to demonstrate a clinically significant change in LBP and function.   Goal status: INITIAL   4. Pt will be able to lift/squat/hold >25 lbs in order to demonstrate functional improvement in lumbopelvic/ knee strength for return ADL and occupational duties.  Goal status: INITIAL     PLAN:   PT FREQUENCY: 1-2x/week   PT DURATION: 12 weeks   PLANNED INTERVENTIONS: Therapeutic exercises, Therapeutic activity, Neuromuscular re-education, Balance training, Gait training, Patient/Family education, Self Care, Joint mobilization, Joint manipulation, Stair training, Prosthetic training, DME instructions, Aquatic Therapy, Dry Needling, Electrical stimulation, Spinal manipulation, Spinal mobilization, Moist heat, scar mobilization, Splintting, Taping, Vasopneumatic device, Traction, Ultrasound, Ionotophoresis 4mg /ml Dexamethasone , Manual therapy, and Re-evaluation   PLAN FOR NEXT  SESSION: assess response to aquatic therapy  Almedia Jacobsen, PTA 02/13/24 4:45 PM [facility] [address] [place], [place], [address] Phone: [phone]   Fax:  [phone]

## 2024-02-16 ENCOUNTER — Encounter (HOSPITAL_BASED_OUTPATIENT_CLINIC_OR_DEPARTMENT_OTHER): Payer: PRIVATE HEALTH INSURANCE | Admitting: Physical Therapy

## 2024-02-16 ENCOUNTER — Telehealth: Payer: Self-pay

## 2024-02-16 NOTE — Telephone Encounter (Signed)
 Patient asking to have appointment moved to earlier date. Opening on 02/24/24 at 8:15 am

## 2024-02-17 ENCOUNTER — Ambulatory Visit (HOSPITAL_BASED_OUTPATIENT_CLINIC_OR_DEPARTMENT_OTHER): Payer: PRIVATE HEALTH INSURANCE | Admitting: Physical Therapy

## 2024-02-18 ENCOUNTER — Encounter (HOSPITAL_BASED_OUTPATIENT_CLINIC_OR_DEPARTMENT_OTHER): Payer: PRIVATE HEALTH INSURANCE | Admitting: Physical Therapy

## 2024-02-19 ENCOUNTER — Ambulatory Visit (HOSPITAL_BASED_OUTPATIENT_CLINIC_OR_DEPARTMENT_OTHER): Payer: PRIVATE HEALTH INSURANCE | Admitting: Physical Therapy

## 2024-02-24 ENCOUNTER — Ambulatory Visit (INDEPENDENT_AMBULATORY_CARE_PROVIDER_SITE_OTHER): Payer: PRIVATE HEALTH INSURANCE | Admitting: Physical Medicine and Rehabilitation

## 2024-02-24 ENCOUNTER — Other Ambulatory Visit: Payer: Self-pay

## 2024-02-24 VITALS — BP 139/91 | HR 75

## 2024-02-24 DIAGNOSIS — M47816 Spondylosis without myelopathy or radiculopathy, lumbar region: Secondary | ICD-10-CM | POA: Diagnosis not present

## 2024-02-24 MED ORDER — BUPIVACAINE HCL 0.5 % IJ SOLN
3.0000 mL | Freq: Once | INTRAMUSCULAR | Status: AC
  Administered 2024-02-24: 3 mL

## 2024-02-24 NOTE — Progress Notes (Signed)
 Pain Scale   Average Pain 8 Patient advising she has chronic back pain without any relief. Patient advising she has pain that radiates to her hip area at times( bilaterally)        +Driver, -BT, -Dye Allergies.

## 2024-02-24 NOTE — Progress Notes (Signed)
 Lori Cameron - 48 y.o. female MRN [id number]  Date of birth: 01/15/1976  Office Visit Note: Visit Date: 02/24/2024 PCP: Rexanne Ingle, MD Referred by: Rexanne Ingle, MD  Subjective: Chief Complaint  Patient presents with   Lower Back - Pain   HPI:  Lori Cameron is a 48 y.o. female who comes in today at the request of Duwaine Pouch, FNP and Dr. Ozell Ada for planned Bilateral  L5-S1 Lumbar facet/medial branch block with fluoroscopic guidance.  The patient has failed conservative care including home exercise, medications, time and activity modification.  This injection will be diagnostic and hopefully therapeutic.  Please see requesting physician notes for further details and justification.  Exam has shown concordant pain with facet joint loading.   ROS Otherwise per HPI.  Assessment & Plan: Visit Diagnoses:    ICD-10-CM   1. Spondylosis without myelopathy or radiculopathy, lumbar region  M47.816 XR C-ARM NO REPORT    Nerve Block    bupivacaine (MARCAINE) 0.5 % (with pres) injection 3 mL      Plan: No additional findings.   Meds & Orders:  Meds ordered this encounter  Medications   bupivacaine (MARCAINE) 0.5 % (with pres) injection 3 mL    Orders Placed This Encounter  Procedures   Nerve Block   XR C-ARM NO REPORT    Follow-up: Return for Review Pain Diary.   Procedures: No procedures performed  Lumbar Diagnostic Facet Joint Nerve Block with Fluoroscopic Guidance   Patient: Lori Cameron      Date of Birth: July 19, 1976 MRN: [id number] PCP: Rexanne Ingle, MD      Visit Date: 02/24/2024   Universal Protocol:    Date/Time: 07/01/258:17 AM  Consent Given By: the patient  Position: PRONE  Additional Comments: Vital signs were monitored before and after the procedure. Patient was prepped and draped in the usual sterile fashion. The correct patient, procedure, and site was verified.   Injection Procedure Details:   Procedure diagnoses:  1.  Spondylosis without myelopathy or radiculopathy, lumbar region      Meds Administered:  Meds ordered this encounter  Medications   bupivacaine (MARCAINE) 0.5 % (with pres) injection 3 mL     Laterality: Bilateral  Location/Site: L5-S1, L4 medial branch and L5 dorsal ramus  Needle: 5.0 in., 25 ga.  Short bevel or Quincke spinal needle  Needle Placement: Oblique pedical  Findings:   -Comments: There was excellent flow of contrast along the articular pillars without intravascular flow.  Procedure Details: The fluoroscope beam is vertically oriented in AP and then obliqued 15 to 20 degrees to the ipsilateral side of the desired nerve to achieve the "Scotty dog" appearance.  The skin over the target area of the junction of the superior articulating process and the transverse process (sacral ala if blocking the L5 dorsal rami) was locally anesthetized with a 1 ml volume of 1% Lidocaine  without Epinephrine.  The spinal needle was inserted and advanced in a trajectory view down to the target.   After contact with periosteum and negative aspirate for blood and CSF, correct placement without intravascular or epidural spread was confirmed by injecting 0.5 ml. of Isovue-250.  A spot radiograph was obtained of this image.    Next, a 0.5 ml. volume of the injectate described above was injected. The needle was then redirected to the other facet joint nerves mentioned above if needed.  Prior to the procedure, the patient was given a Pain Diary which was completed for baseline  measurements.  After the procedure, the patient rated their pain every 30 minutes and will continue rating at this frequency for a total of 5 hours.  The patient has been asked to complete the Diary and return to us  by mail, fax or hand delivered as soon as possible.   Additional Comments:  The patient tolerated the procedure well Dressing: 2 x 2 sterile gauze and Band-Aid    Post-procedure details: Patient was observed  during the procedure. Post-procedure instructions were reviewed.  Patient left the clinic in stable condition.   Clinical History: MRI LUMBAR SPINE WITHOUT CONTRAST   TECHNIQUE: Multiplanar, multisequence MR imaging of the lumbar spine was performed. No intravenous contrast was administered.   COMPARISON:  MRI lumbar spine 02/06/2015.   FINDINGS: Segmentation:  Standard.   Alignment: Lumbar lordosis is maintained. No significant listhesis.   Vertebrae: Prominent Modic type 1 degenerative endplate changes at L5-S1 with associated discogenic edema which is new since the prior study. Vertebral body heights are maintained. No additional bone marrow edema or evidence of acute fracture.   Conus medullaris and cauda equina: Conus extends to the T12-L1 level. Conus and cauda equina appear normal.   Paraspinal and other soft tissues: Visualized paraspinal soft tissues are unremarkable. Prominence of the partially visualized uterus with suggestion of possible fibroids. Additional 3.2 cm cyst in the pelvis right of midline suggestive of adnexal cyst.   Disc levels:   T12-L1: No significant disc bulge. No significant spinal canal stenosis or foraminal stenosis.   L1-2: No significant disc bulge. No significant spinal canal stenosis or foraminal stenosis.   L2-3: No significant disc bulge. No significant spinal canal stenosis or foraminal stenosis.   L3-4: No significant disc bulge. No significant spinal canal stenosis or foraminal stenosis.   L4-5: No significant disc bulge. No significant spinal canal stenosis or foraminal stenosis.   L5-S1: Disc desiccation and moderate disc height loss. Small disc bulge which indents the ventral thecal sac resulting in narrowing of the lateral recesses without significant impingement upon the traversing nerve roots. Mild facet arthrosis. There is mild-to-moderate right and moderate left foraminal stenosis.   IMPRESSION: Degenerative  changes at L5-S1 which are significantly increased since the prior MRI. Disc bulge and facet arthrosis at L5-S1 resulting in moderate left and mild-to-moderate right foraminal stenosis. There is mild lateral recess narrowing at L5-S1 without significant impingement upon the traversing nerve roots.   Prominent degenerative endplate changes and discogenic edema at L5-S1 which may contribute to back pain.   Findings suggestive of fibroid uterus. 3.2 cm cyst in the pelvis, likely adnexal cyst.     Electronically Signed   By: Donnice Mania M.D.   On: 12/05/2023 12:12     Objective:  VS:  HT:    WT:   BMI:     BP:(!) 139/91  HR:75bpm  TEMP: ( )  RESP:  Physical Exam Vitals and nursing note reviewed.  Constitutional:      General: She is not in acute distress.    Appearance: Normal appearance. She is not ill-appearing.  HENT:     Head: Normocephalic and atraumatic.     Right Ear: External ear normal.     Left Ear: External ear normal.   Eyes:     Extraocular Movements: Extraocular movements intact.    Cardiovascular:     Rate and Rhythm: Normal rate.     Pulses: Normal pulses.  Pulmonary:     Effort: Pulmonary effort is normal. No respiratory distress.  Abdominal:     General: There is no distension.     Palpations: Abdomen is soft.   Musculoskeletal:        General: Tenderness present.     Cervical back: Neck supple.     Right lower leg: No edema.     Left lower leg: No edema.     Comments: Patient has good distal strength with no pain over the greater trochanters.  No clonus or focal weakness.   Skin:    Findings: No erythema, lesion or rash.   Neurological:     General: No focal deficit present.     Mental Status: She is alert and oriented to person, place, and time.     Sensory: No sensory deficit.     Motor: No weakness or abnormal muscle tone.     Coordination: Coordination normal.   Psychiatric:        Mood and Affect: Mood normal.        Behavior:  Behavior normal.      Imaging: XR C-ARM NO REPORT Result Date: 02/24/2024 Please see Notes tab for imaging impression.

## 2024-02-24 NOTE — Patient Instructions (Signed)

## 2024-02-24 NOTE — Procedures (Signed)
 Lumbar Diagnostic Facet Joint Nerve Block with Fluoroscopic Guidance   Patient: Lori Cameron      Date of Birth: Sep 02, 1975 MRN: [id number] PCP: Rexanne Ingle, MD      Visit Date: 02/24/2024   Universal Protocol:    Date/Time: 07/01/258:17 AM  Consent Given By: the patient  Position: PRONE  Additional Comments: Vital signs were monitored before and after the procedure. Patient was prepped and draped in the usual sterile fashion. The correct patient, procedure, and site was verified.   Injection Procedure Details:   Procedure diagnoses:  1. Spondylosis without myelopathy or radiculopathy, lumbar region      Meds Administered:  Meds ordered this encounter  Medications   bupivacaine (MARCAINE) 0.5 % (with pres) injection 3 mL     Laterality: Bilateral  Location/Site: L5-S1, L4 medial branch and L5 dorsal ramus  Needle: 5.0 in., 25 ga.  Short bevel or Quincke spinal needle  Needle Placement: Oblique pedical  Findings:   -Comments: There was excellent flow of contrast along the articular pillars without intravascular flow.  Procedure Details: The fluoroscope beam is vertically oriented in AP and then obliqued 15 to 20 degrees to the ipsilateral side of the desired nerve to achieve the "Scotty dog" appearance.  The skin over the target area of the junction of the superior articulating process and the transverse process (sacral ala if blocking the L5 dorsal rami) was locally anesthetized with a 1 ml volume of 1% Lidocaine  without Epinephrine.  The spinal needle was inserted and advanced in a trajectory view down to the target.   After contact with periosteum and negative aspirate for blood and CSF, correct placement without intravascular or epidural spread was confirmed by injecting 0.5 ml. of Isovue-250.  A spot radiograph was obtained of this image.    Next, a 0.5 ml. volume of the injectate described above was injected. The needle was then redirected to the other  facet joint nerves mentioned above if needed.  Prior to the procedure, the patient was given a Pain Diary which was completed for baseline measurements.  After the procedure, the patient rated their pain every 30 minutes and will continue rating at this frequency for a total of 5 hours.  The patient has been asked to complete the Diary and return to us  by mail, fax or hand delivered as soon as possible.   Additional Comments:  The patient tolerated the procedure well Dressing: 2 x 2 sterile gauze and Band-Aid    Post-procedure details: Patient was observed during the procedure. Post-procedure instructions were reviewed.  Patient left the clinic in stable condition.

## 2024-02-26 ENCOUNTER — Telehealth: Payer: Self-pay

## 2024-02-26 DIAGNOSIS — M47816 Spondylosis without myelopathy or radiculopathy, lumbar region: Secondary | ICD-10-CM

## 2024-02-26 NOTE — Telephone Encounter (Signed)
 Ok to repeat for 2nd MBB, I put the order in

## 2024-02-26 NOTE — Telephone Encounter (Signed)
 Lori Cameron

## 2024-02-26 NOTE — Addendum Note (Signed)
 Addended by: ELDONNA GARDINER POUR on: 02/26/2024 03:26 PM   Modules accepted: Orders

## 2024-03-04 ENCOUNTER — Ambulatory Visit (HOSPITAL_BASED_OUTPATIENT_CLINIC_OR_DEPARTMENT_OTHER): Payer: Self-pay | Admitting: Physical Therapy

## 2024-03-08 ENCOUNTER — Ambulatory Visit: Payer: PRIVATE HEALTH INSURANCE

## 2024-03-08 ENCOUNTER — Other Ambulatory Visit: Payer: Self-pay

## 2024-03-08 ENCOUNTER — Ambulatory Visit
Admission: EM | Admit: 2024-03-08 | Discharge: 2024-03-08 | Disposition: A | Discharge status: Home / Self Care | Payer: PRIVATE HEALTH INSURANCE | Attending: Family Medicine | Admitting: Family Medicine

## 2024-03-08 DIAGNOSIS — M79644 Pain in right finger(s): Secondary | ICD-10-CM | POA: Diagnosis not present

## 2024-03-08 MED ORDER — NAPROXEN 500 MG PO TABS: 500.0000 mg | ORAL_TABLET | Freq: Two times a day (BID) | ORAL | 0 refills | Status: DC

## 2024-03-08 MED ORDER — PREDNISONE 10 MG (21) PO TBPK: ORAL_TABLET | Freq: Every day | ORAL | 0 refills | Status: DC

## 2024-03-08 NOTE — Discharge Instructions (Signed)
 Limit use of hand while is painful Start with the prednisone  pack.  Take all of day 1 today (3 now and then 3 later) When you finish the prednisone  take naproxen  2 times a day See your usual primary care and follow-up. Consider visit with Dr. Curtis or hand specialist if you fail to improve

## 2024-03-08 NOTE — ED Triage Notes (Signed)
 Right middle finger pain x months, worse since last night. Reports last night had increased pain, swelling, difficulty bending finger. Tylenol  arthritis and motrin  prn.

## 2024-03-08 NOTE — ED Provider Notes (Addendum)
 [facility]    CSN: [id number] Arrival date & time: 03/08/24  1557      History   Chief Complaint Chief Complaint  Patient presents with   Hand Pain    HPI Lori Cameron is a 48 y.o. female.   HPI  Patient states that she has pain in her right middle finger for over a month.  Is been worse for the last couple of days.  Since last night has been much more severe and she has limited movement of the finger.  She has had no accident or injury.  No overuse.  She works as an Char[employer] fundraiser. 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   Fibroid uterus 01/07/2018   Left lumbar radiculitis 07/12/2013   Depression    Asthma 03/09/2011    Past Surgical History:  Procedure Laterality Date   ABDOMINAL SURGERY     LAPAROTOMY APPENDECTOMY, LYSIS OF ADHESIONS   APPENDECTOMY     CERVICAL BIOPSY  W/ LOOP ELECTRODE EXCISION     CINI MARGINS FREE   DILITATION & CURRETTAGE/HYSTROSCOPY WITH HYDROTHERMAL ABLATION N/A 02/23/2020   Procedure: DILATATION & CURETTAGE/HYSTEROSCOPY WITH HYDROTHERMAL ABLATION;  Surgeon: Timmie Norris, MD;  Location: [facility];  Service:  Gynecology;  Laterality: N/A;   INTRAUTERINE DEVICE (IUD) INSERTION N/A 02/23/2020   Procedure: INTRAUTERINE DEVICE (IUD) INSERTION MARENA;  Surgeon: Timmie Norris, MD;  Location: [facility];  Service: Gynecology;  Laterality: N/A;   MEDIAL PARTIAL KNEE REPLACEMENT Right 11/2021   TONSILLECTOMY AND ADENOIDECTOMY      OB History     Gravida  1   Para  0   Term      Preterm      AB  1   Living  0      SAB      IAB      Ectopic      Multiple      Live Births               Home Medications    Prior to Admission medications   Medication Sig Start Date End Date Taking? Authorizing Provider  naproxen  (NAPROSYN ) 500 MG tablet Take 1 tablet (500 mg total) by mouth 2 (two) times daily with a meal. 03/08/24  Yes Maranda Jamee Jacob, MD  predniSONE  (STERAPRED UNI-PAK 21 TAB) 10 MG (21) TBPK tablet Take by mouth daily. tad 03/08/24  Yes Maranda Jamee Jacob, MD  ALPRAZolam (XANAX) 0.25 MG tablet Take 0.25 mg by mouth daily as needed for anxiety. 08/08/23   [provider]  ammonium lactate (AMLACTIN DAILY) 12 % lotion 1 application to affected area Externally Twice a day for 30 days 07/06/19   [provider]  B Complex Vitamins (B COMPLEX PO) Take by mouth daily.    [provider]  citalopram (CELEXA) 10 MG tablet citalopram 10 mg tablet    [provider]  clobetasol ointment (TEMOVATE) 0.05 % APPLY THIN LAYER OF OINTMENT TOPICALLY TO AFFECTED AREA NIGHTLY FOR 14 DAYS AND THEN USE UP TO TWICE A WEEK AS NEEDED THEREAFTER 03/20/22   [provider]  losartan-hydrochlorothiazide (HYZAAR) 50-12.5 MG tablet Take 1 tablet by mouth daily.    [provider]  MAGNESIUM PO Take by mouth.    [provider]  tretinoin (RETIN-A) 0.025 % cream APPLY A PEA SIZE OF CREAM TOPICALLY TO FACE NIGHTLY 04/17/20   [provider]  VENTOLIN  HFA 108 (90 Base) MCG/ACT inhaler SMARTSIG:1 Puff(s) By Mouth Every 6 Hours  PRN    [provider]  VITAMIN D, ERGOCALCIFEROL, PO Take by mouth.    [provider]  VITAMIN K PO Take by mouth.    [provider]    Family History Family History  Problem Relation Age of Onset   Diabetes Mother    Hypertension Mother    Cervical cancer Mother    Arthritis Mother    Healthy Sister    Healthy Sister    Healthy Brother    Healthy Brother    Colon cancer Maternal Aunt    Hypertension Maternal Grandmother    Heart disease Maternal Grandmother    BRCA 1/2 Neg Hx    Breast cancer Neg Hx     Social History Social History   Tobacco Use   Smoking status: Former    Passive exposure: Current   Smokeless tobacco: Never  Vaping Use   Vaping status: Never Used  Substance Use Topics   Alcohol use: Yes    Alcohol/week: 5.0 standard drinks of alcohol    Types: 5 Standard drinks or equivalent per week    Comment: occ   Drug use: No     Allergies   Amoxicillin-pot clavulanate and Clindamycin hcl   Review of Systems Review of Systems See HPI  Physical Exam Triage Vital Signs ED Triage Vitals  Encounter Vitals Group     BP 03/08/24 1603 132/86     Girls Systolic BP Percentile --      Girls Diastolic BP Percentile --      Boys Systolic BP Percentile --      Boys Diastolic BP Percentile --      Pulse Rate 03/08/24 1603 86     Resp 03/08/24 1603 16     Temp 03/08/24 1603 98.5 F (36.9 C)     Temp src --      SpO2 03/08/24 1603 97 %     Weight --      Height --      Head Circumference --      Peak Flow --      Pain Score 03/08/24 1605 7     Pain Loc --      Pain Education --      Exclude from Growth Chart --    No data found.  Updated Vital Signs BP 132/86   Pulse 86   Temp 98.5 F (36.9 C)   Resp 16   SpO2 97%      Physical Exam Constitutional:      General: She is not in  acute distress.    Appearance: She is well-developed and normal weight.  HENT:     Head: Normocephalic and atraumatic.  Eyes:      Conjunctiva/sclera: Conjunctivae normal.     Pupils: Pupils are equal, round, and reactive to light.  Cardiovascular:     Rate and Rhythm: Normal rate.  Pulmonary:     Effort: Pulmonary effort is normal. No respiratory distress.  Abdominal:     General: There is no distension.     Palpations: Abdomen is soft.  Musculoskeletal:        General: Swelling and tenderness present. Normal range of motion.     Cervical back: Normal range of motion.     Comments: Right hand is examined.  There are some mild swelling around the third MP joint.  Mild swelling around the PIP joint.  No tenderness over or swelling over the DIP.  Full range of motion.  There is tenderness in the palm of the right hand over the long finger A1 C pulley.  No triggering.  Pain with resistance to finger flexion.  Skin:    General: Skin is warm and dry.  Neurological:     Mental Status: She is alert.      UC Treatments / Results  Labs (all labs ordered are listed, but only abnormal results are displayed) Labs Reviewed - No data to display  EKG   Radiology DG Hand Complete Right Result Date: 03/08/2024 CLINICAL DATA:  Pain middle finger EXAM: RIGHT HAND - COMPLETE 3+ VIEW COMPARISON:  May 14, 2021 FINDINGS: No acute fracture or dislocation. Mild joint space loss of the DIP joints of the third and fourth digits with osteophyte formation. Similar changes noted at the first interphalangeal joint. Soft tissues are unremarkable. IMPRESSION: No acute fracture or dislocation. Mild osteoarthritis of the first, third, and fourth digits. Electronically Signed   By: Rogelia Myers M.D.   On: 03/08/2024 16:37    Procedures Procedures (including critical care time)  Medications Ordered in UC Medications - No data to display  Initial Impression / Assessment and Plan / UC Course  I have reviewed the triage vital signs and the nursing notes.  Pertinent labs & imaging results that were available during my care of the  patient were reviewed by me and considered in my medical decision making (see chart for details).     Patient appears to have flexor tendinitis in the long finger without triggering, but also some swelling around the IP joint.  X-rays are performed and are negative, except for an mild osteoarthritic changes as noted above Final Clinical Impressions(s) / UC Diagnoses   Final diagnoses:  Pain in finger of right hand     Discharge Instructions      Limit use of hand while is painful Start with the prednisone  pack.  Take all of day 1 today (3 now and then 3 later) When you finish the prednisone  take naproxen  2 times a day See your usual primary care and follow-up. Consider visit with Dr. Curtis or hand specialist if you fail to improve     ED Prescriptions     Medication Sig Dispense Auth. Provider   predniSONE  (STERAPRED UNI-PAK 21 TAB) 10 MG (21) TBPK tablet Take by mouth daily. tad 21 tablet Maranda Jamee Jacob, MD   naproxen  (NAPROSYN ) 500 MG tablet Take 1 tablet (500 mg total) by mouth 2 (two) times daily with a meal. 30 tablet Maranda Jamee Jacob, MD      PDMP  not reviewed this encounter.   Maranda Jamee Jacob, MD 03/08/24 1623    Maranda Jamee Jacob, MD 03/08/24 [phone]

## 2024-03-11 ENCOUNTER — Encounter: Payer: Self-pay | Admitting: Physical Medicine and Rehabilitation

## 2024-03-17 ENCOUNTER — Ambulatory Visit (INDEPENDENT_AMBULATORY_CARE_PROVIDER_SITE_OTHER): Payer: PRIVATE HEALTH INSURANCE | Admitting: Physical Medicine and Rehabilitation

## 2024-03-17 ENCOUNTER — Other Ambulatory Visit: Payer: Self-pay

## 2024-03-17 VITALS — BP 143/87 | HR 84

## 2024-03-17 DIAGNOSIS — M47816 Spondylosis without myelopathy or radiculopathy, lumbar region: Secondary | ICD-10-CM | POA: Diagnosis not present

## 2024-03-17 MED ORDER — BUPIVACAINE HCL 0.5 % IJ SOLN: 3.0000 mL | Freq: Once | INTRAMUSCULAR | Status: DC

## 2024-03-17 NOTE — Progress Notes (Signed)
 Pain Scale   Average Pain 7 Patient advising her pain is constant in her lower back with out relief. 2nd Pain diary given        +Driver, -BT, -Dye Allergies.

## 2024-03-17 NOTE — Patient Instructions (Signed)

## 2024-03-21 NOTE — Procedures (Signed)
 Lumbar Diagnostic Facet Joint Nerve Block with Fluoroscopic Guidance   Patient: Lori Cameron      Date of Birth: 11-Jun-1976 MRN: [id number] PCP: Rexanne Ingle, MD      Visit Date: 03/17/2024   Universal Protocol:    Date/Time: 07/27/254:55 PM  Consent Given By: the patient  Position: PRONE  Additional Comments: Vital signs were monitored before and after the procedure. Patient was prepped and draped in the usual sterile fashion. The correct patient, procedure, and site was verified.   Injection Procedure Details:   Procedure diagnoses:  1. Spondylosis without myelopathy or radiculopathy, lumbar region      Meds Administered:  Meds ordered this encounter  Medications   bupivacaine  (MARCAINE ) 0.5 % (with pres) injection 3 mL     Laterality: Bilateral  Location/Site: L5-S1, L4 medial branch and L5 dorsal ramus  Needle: 5.0 in., 25 ga.  Short bevel or Quincke spinal needle  Needle Placement: Oblique pedical  Findings:   -Comments: There was excellent flow of contrast along the articular pillars without intravascular flow.  Procedure Details: The fluoroscope beam is vertically oriented in AP and then obliqued 15 to 20 degrees to the ipsilateral side of the desired nerve to achieve the "Scotty dog" appearance.  The skin over the target area of the junction of the superior articulating process and the transverse process (sacral ala if blocking the L5 dorsal rami) was locally anesthetized with a 1 ml volume of 1% Lidocaine  without Epinephrine.  The spinal needle was inserted and advanced in a trajectory view down to the target.   After contact with periosteum and negative aspirate for blood and CSF, correct placement without intravascular or epidural spread was confirmed by injecting 0.5 ml. of Isovue-250.  A spot radiograph was obtained of this image.    Next, a 0.5 ml. volume of the injectate described above was injected. The needle was then redirected to the other  facet joint nerves mentioned above if needed.  Prior to the procedure, the patient was given a Pain Diary which was completed for baseline measurements.  After the procedure, the patient rated their pain every 30 minutes and will continue rating at this frequency for a total of 5 hours.  The patient has been asked to complete the Diary and return to us  by mail, fax or hand delivered as soon as possible.   Additional Comments:  The patient tolerated the procedure well Dressing: 2 x 2 sterile gauze and Band-Aid    Post-procedure details: Patient was observed during the procedure. Post-procedure instructions were reviewed.  Patient left the clinic in stable condition.

## 2024-03-21 NOTE — Progress Notes (Signed)
 Lori Cameron - 48 y.o. female MRN [id number]  Date of birth: [birth year]  Office Visit Note: Visit Date: 03/17/2024 PCP: Rexanne Ingle, MD Referred by: Rexanne Ingle, MD  Subjective: Chief Complaint  Patient presents with   Lower Back - Pain   HPI:  Lori Cameron is a 48 y.o. female who comes in today for planned repeat Bilateral L5-S1 Lumbar facet/medial branch block with fluoroscopic guidance.  The patient has failed conservative care including home exercise, medications, time and activity modification.  This injection will be diagnostic and hopefully therapeutic.  Please see requesting physician notes for further details and justification.  Exam shows concordant low back pain with facet joint loading and extension. Patient received more than 80% pain relief from prior injection. This would be the second block in a diagnostic double block paradigm.     Referring:Megan Trudy, FNP and Dr. Ozell Ada   ROS Otherwise per HPI.  Assessment & Plan: Visit Diagnoses:    ICD-10-CM   1. Spondylosis without myelopathy or radiculopathy, lumbar region  M47.816 XR C-ARM NO REPORT    Facet Injection    bupivacaine  (MARCAINE ) 0.5 % (with pres) injection 3 mL      Plan: No additional findings.   Meds & Orders:  Meds ordered this encounter  Medications   bupivacaine  (MARCAINE ) 0.5 % (with pres) injection 3 mL    Orders Placed This Encounter  Procedures   Facet Injection   XR C-ARM NO REPORT    Follow-up: Return for Review Pain Diary.   Procedures: No procedures performed  Lumbar Diagnostic Facet Joint Nerve Block with Fluoroscopic Guidance   Patient: Lori Cameron      Date of Birth: [birth year] MRN: [id number] PCP: Rexanne Ingle, MD      Visit Date: 03/17/2024   Universal Protocol:    Date/Time: 07/27/254:55 PM  Consent Given By: the patient  Position: PRONE  Additional Comments: Vital signs were monitored before and after the procedure. Patient was  prepped and draped in the usual sterile fashion. The correct patient, procedure, and site was verified.   Injection Procedure Details:   Procedure diagnoses:  1. Spondylosis without myelopathy or radiculopathy, lumbar region      Meds Administered:  Meds ordered this encounter  Medications   bupivacaine  (MARCAINE ) 0.5 % (with pres) injection 3 mL     Laterality: Bilateral  Location/Site: L5-S1, L4 medial branch and L5 dorsal ramus  Needle: 5.0 in., 25 ga.  Short bevel or Quincke spinal needle  Needle Placement: Oblique pedical  Findings:   -Comments: There was excellent flow of contrast along the articular pillars without intravascular flow.  Procedure Details: The fluoroscope beam is vertically oriented in AP and then obliqued 15 to 20 degrees to the ipsilateral side of the desired nerve to achieve the "Scotty dog" appearance.  The skin over the target area of the junction of the superior articulating process and the transverse process (sacral ala if blocking the L5 dorsal rami) was locally anesthetized with a 1 ml volume of 1% Lidocaine  without Epinephrine.  The spinal needle was inserted and advanced in a trajectory view down to the target.   After contact with periosteum and negative aspirate for blood and CSF, correct placement without intravascular or epidural spread was confirmed by injecting 0.5 ml. of Isovue-250.  A spot radiograph was obtained of this image.    Next, a 0.5 ml. volume of the injectate described above was injected. The needle was then redirected to the  other facet joint nerves mentioned above if needed.  Prior to the procedure, the patient was given a Pain Diary which was completed for baseline measurements.  After the procedure, the patient rated their pain every 30 minutes and will continue rating at this frequency for a total of 5 hours.  The patient has been asked to complete the Diary and return to us  by mail, fax or hand delivered as soon as  possible.   Additional Comments:  The patient tolerated the procedure well Dressing: 2 x 2 sterile gauze and Band-Aid    Post-procedure details: Patient was observed during the procedure. Post-procedure instructions were reviewed.  Patient left the clinic in stable condition.   Clinical History: MRI LUMBAR SPINE WITHOUT CONTRAST   TECHNIQUE: Multiplanar, multisequence MR imaging of the lumbar spine was performed. No intravenous contrast was administered.   COMPARISON:  MRI lumbar spine 02/06/2015.   FINDINGS: Segmentation:  Standard.   Alignment: Lumbar lordosis is maintained. No significant listhesis.   Vertebrae: Prominent Modic type 1 degenerative endplate changes at L5-S1 with associated discogenic edema which is new since the prior study. Vertebral body heights are maintained. No additional bone marrow edema or evidence of acute fracture.   Conus medullaris and cauda equina: Conus extends to the T12-L1 level. Conus and cauda equina appear normal.   Paraspinal and other soft tissues: Visualized paraspinal soft tissues are unremarkable. Prominence of the partially visualized uterus with suggestion of possible fibroids. Additional 3.2 cm cyst in the pelvis right of midline suggestive of adnexal cyst.   Disc levels:   T12-L1: No significant disc bulge. No significant spinal canal stenosis or foraminal stenosis.   L1-2: No significant disc bulge. No significant spinal canal stenosis or foraminal stenosis.   L2-3: No significant disc bulge. No significant spinal canal stenosis or foraminal stenosis.   L3-4: No significant disc bulge. No significant spinal canal stenosis or foraminal stenosis.   L4-5: No significant disc bulge. No significant spinal canal stenosis or foraminal stenosis.   L5-S1: Disc desiccation and moderate disc height loss. Small disc bulge which indents the ventral thecal sac resulting in narrowing of the lateral recesses without  significant impingement upon the traversing nerve roots. Mild facet arthrosis. There is mild-to-moderate right and moderate left foraminal stenosis.   IMPRESSION: Degenerative changes at L5-S1 which are significantly increased since the prior MRI. Disc bulge and facet arthrosis at L5-S1 resulting in moderate left and mild-to-moderate right foraminal stenosis. There is mild lateral recess narrowing at L5-S1 without significant impingement upon the traversing nerve roots.   Prominent degenerative endplate changes and discogenic edema at L5-S1 which may contribute to back pain.   Findings suggestive of fibroid uterus. 3.2 cm cyst in the pelvis, likely adnexal cyst.     Electronically Signed   By: Donnice Mania M.D.   On: 12/05/2023 12:12     Objective:  VS:  HT:    WT:   BMI:     BP:(!) 143/87  HR:84bpm  TEMP: ( )  RESP:  Physical Exam Vitals and nursing note reviewed.  Constitutional:      General: She is not in acute distress.    Appearance: Normal appearance. She is not ill-appearing.  HENT:     Head: Normocephalic and atraumatic.     Right Ear: External ear normal.     Left Ear: External ear normal.  Eyes:     Extraocular Movements: Extraocular movements intact.  Cardiovascular:     Rate and Rhythm: Normal rate.  Pulses: Normal pulses.  Pulmonary:     Effort: Pulmonary effort is normal. No respiratory distress.  Abdominal:     General: There is no distension.     Palpations: Abdomen is soft.  Musculoskeletal:        General: Tenderness present.     Cervical back: Neck supple.     Right lower leg: No edema.     Left lower leg: No edema.     Comments: Patient has good distal strength with no pain over the greater trochanters.  No clonus or focal weakness.  Skin:    Findings: No erythema, lesion or rash.  Neurological:     General: No focal deficit present.     Mental Status: She is alert and oriented to person, place, and time.     Sensory: No sensory  deficit.     Motor: No weakness or abnormal muscle tone.     Coordination: Coordination normal.  Psychiatric:        Mood and Affect: Mood normal.        Behavior: Behavior normal.      Imaging: No results found.

## 2024-03-25 ENCOUNTER — Other Ambulatory Visit: Payer: Self-pay | Admitting: Physical Medicine and Rehabilitation

## 2024-03-25 DIAGNOSIS — M47816 Spondylosis without myelopathy or radiculopathy, lumbar region: Secondary | ICD-10-CM

## 2024-03-25 DIAGNOSIS — G8929 Other chronic pain: Secondary | ICD-10-CM

## 2024-04-22 ENCOUNTER — Other Ambulatory Visit: Payer: Self-pay

## 2024-04-22 ENCOUNTER — Ambulatory Visit (INDEPENDENT_AMBULATORY_CARE_PROVIDER_SITE_OTHER): Payer: PRIVATE HEALTH INSURANCE | Admitting: Physical Medicine and Rehabilitation

## 2024-04-22 VITALS — BP 163/95 | HR 75

## 2024-04-22 DIAGNOSIS — M47816 Spondylosis without myelopathy or radiculopathy, lumbar region: Secondary | ICD-10-CM | POA: Diagnosis not present

## 2024-04-22 MED ORDER — METHYLPREDNISOLONE ACETATE 40 MG/ML IJ SUSP
40.0000 mg | Freq: Once | INTRAMUSCULAR | Status: AC
  Administered 2024-04-22: 40 mg

## 2024-04-22 NOTE — Procedures (Unsigned)
 Lumbar Facet Joint Nerve Denervation  Patient: Lori Cameron      Date of Birth: 1976-03-20 MRN: [id number] PCP: Rexanne Ingle, MD      Visit Date: 04/22/2024   Universal Protocol:    Date/Time: 08/28/253:58 PM  Consent Given By: the patient  Position: PRONE  Additional Comments: Vital signs were monitored before and after the procedure. Patient was prepped and draped in the usual sterile fashion. The correct patient, procedure, and site was verified.   Injection Procedure Details:   Procedure diagnoses:  1. Spondylosis without myelopathy or radiculopathy, lumbar region      Meds Administered:  Meds ordered this encounter  Medications   methylPREDNISolone  acetate (DEPO-MEDROL ) injection 40 mg     Laterality: Bilateral  Location/Site:  L5-S1, L4 medial branch and L5 dorsal ramus  Needle: 18 ga.,  10mm active tip, RF Cannula  Needle Placement: Along juncture of superior articular process and transverse pocess  Findings:  -Comments:  Procedure Details: For each desired target nerve, the corresponding transverse process (sacral ala for the L5 dorsal rami) was identified and the fluoroscope was positioned to square off the endplates of the corresponding vertebral body to achieve a true AP midline view.  The beam was then obliqued 15 to 20 degrees and caudally tilted 15 to 20 degrees to line up a trajectory along the target nerves. The skin over the target of the junction of superior articulating process and transverse process (sacral ala for the L5 dorsal rami) was infiltrated with 1ml of 1% Lidocaine  without Epinephrine.  The 18 gauge 10mm active tip outer cannula was advanced in trajectory view to the target.  This procedure was repeated for each target nerve.  Then, for all levels, the outer cannula placement was fine-tuned and the position was then confirmed with bi-planar imaging.    Test stimulation was done both at sensory and motor levels to ensure there  was no radicular stimulation. The target tissues were then infiltrated with 1 ml of 1% Lidocaine  without Epinephrine. Subsequently, a percutaneous neurotomy was carried out for 90 seconds at 80 degrees Celsius.  After the completion of the lesion, 1 ml of injectate was delivered. It was then repeated for each facet joint nerve mentioned above. Appropriate radiographs were obtained to verify the probe placement during the neurotomy.   Additional Comments:  The patient tolerated the procedure well Dressing: 2 x 2 sterile gauze and Band-Aid    Post-procedure details: Patient was observed during the procedure. Post-procedure instructions were reviewed.  Patient left the clinic in stable condition.

## 2024-04-22 NOTE — Progress Notes (Unsigned)
 Lori Cameron - 48 y.o. female MRN [id number]  Date of birth: 1976-06-04  Office Visit Note: Visit Date: 04/22/2024 PCP: Rexanne Ingle, MD Referred by: Rexanne Ingle, MD  Subjective: Chief Complaint  Patient presents with   Lower Back - Pain   HPI:  Lori Cameron is a 48 y.o. female who comes in todayfor planned radiofrequency ablation of the Bilateral L5-S1 Lumbar facet joints. This would be ablation of the corresponding medial branches and/or dorsal rami.  Patient has had double diagnostic blocks with more than 50% relief.  These are documented on pain diary.  They have had chronic back pain for quite some time, more than 3 months, which has been an ongoing situation with recalcitrant axial back pain.  They have no radicular pain.  Their axial pain is worse with standing and ambulating and on exam today with facet loading.  They have had physical therapy as well as home exercise program.  The imaging noted in the chart below indicated facet pathology. Accordingly they meet all the criteria and qualification for for radiofrequency ablation and we are going to complete this today hopefully for more longer term relief as part of comprehensive management program.   ROS Otherwise per HPI.  Assessment & Plan: Visit Diagnoses:    ICD-10-CM   1. Spondylosis without myelopathy or radiculopathy, lumbar region  M47.816 XR C-ARM NO REPORT    Radiofrequency,Lumbar    methylPREDNISolone  acetate (DEPO-MEDROL ) injection 40 mg      Plan: No additional findings.   Meds & Orders:  Meds ordered this encounter  Medications   methylPREDNISolone  acetate (DEPO-MEDROL ) injection 40 mg    Orders Placed This Encounter  Procedures   Radiofrequency,Lumbar   XR C-ARM NO REPORT    Follow-up: Return for visit to requesting provider as needed.   Procedures: No procedures performed  Lumbar Facet Joint Nerve Denervation  Patient: Lori Cameron      Date of Birth: 01-Feb-1976 MRN:  [id number] PCP: Rexanne Ingle, MD      Visit Date: 04/22/2024   Universal Protocol:    Date/Time: 08/28/253:58 PM  Consent Given By: the patient  Position: PRONE  Additional Comments: Vital signs were monitored before and after the procedure. Patient was prepped and draped in the usual sterile fashion. The correct patient, procedure, and site was verified.   Injection Procedure Details:   Procedure diagnoses:  1. Spondylosis without myelopathy or radiculopathy, lumbar region      Meds Administered:  Meds ordered this encounter  Medications   methylPREDNISolone  acetate (DEPO-MEDROL ) injection 40 mg     Laterality: Bilateral  Location/Site:  L5-S1, L4 medial branch and L5 dorsal ramus  Needle: 18 ga.,  10mm active tip, RF Cannula  Needle Placement: Along juncture of superior articular process and transverse pocess  Findings:  -Comments:  Procedure Details: For each desired target nerve, the corresponding transverse process (sacral ala for the L5 dorsal rami) was identified and the fluoroscope was positioned to square off the endplates of the corresponding vertebral body to achieve a true AP midline view.  The beam was then obliqued 15 to 20 degrees and caudally tilted 15 to 20 degrees to line up a trajectory along the target nerves. The skin over the target of the junction of superior articulating process and transverse process (sacral ala for the L5 dorsal rami) was infiltrated with 1ml of 1% Lidocaine  without Epinephrine.  The 18 gauge 10mm active tip outer cannula was advanced in trajectory view to the  target.  This procedure was repeated for each target nerve.  Then, for all levels, the outer cannula placement was fine-tuned and the position was then confirmed with bi-planar imaging.    Test stimulation was done both at sensory and motor levels to ensure there was no radicular stimulation. The target tissues were then infiltrated with 1 ml of 1% Lidocaine  without  Epinephrine. Subsequently, a percutaneous neurotomy was carried out for 90 seconds at 80 degrees Celsius.  After the completion of the lesion, 1 ml of injectate was delivered. It was then repeated for each facet joint nerve mentioned above. Appropriate radiographs were obtained to verify the probe placement during the neurotomy.   Additional Comments:  The patient tolerated the procedure well Dressing: 2 x 2 sterile gauze and Band-Aid    Post-procedure details: Patient was observed during the procedure. Post-procedure instructions were reviewed.  Patient left the clinic in stable condition.      Clinical History: MRI LUMBAR SPINE WITHOUT CONTRAST   TECHNIQUE: Multiplanar, multisequence MR imaging of the lumbar spine was performed. No intravenous contrast was administered.   COMPARISON:  MRI lumbar spine 02/06/2015.   FINDINGS: Segmentation:  Standard.   Alignment: Lumbar lordosis is maintained. No significant listhesis.   Vertebrae: Prominent Modic type 1 degenerative endplate changes at L5-S1 with associated discogenic edema which is new since the prior study. Vertebral body heights are maintained. No additional bone marrow edema or evidence of acute fracture.   Conus medullaris and cauda equina: Conus extends to the T12-L1 level. Conus and cauda equina appear normal.   Paraspinal and other soft tissues: Visualized paraspinal soft tissues are unremarkable. Prominence of the partially visualized uterus with suggestion of possible fibroids. Additional 3.2 cm cyst in the pelvis right of midline suggestive of adnexal cyst.   Disc levels:   T12-L1: No significant disc bulge. No significant spinal canal stenosis or foraminal stenosis.   L1-2: No significant disc bulge. No significant spinal canal stenosis or foraminal stenosis.   L2-3: No significant disc bulge. No significant spinal canal stenosis or foraminal stenosis.   L3-4: No significant disc bulge. No  significant spinal canal stenosis or foraminal stenosis.   L4-5: No significant disc bulge. No significant spinal canal stenosis or foraminal stenosis.   L5-S1: Disc desiccation and moderate disc height loss. Small disc bulge which indents the ventral thecal sac resulting in narrowing of the lateral recesses without significant impingement upon the traversing nerve roots. Mild facet arthrosis. There is mild-to-moderate right and moderate left foraminal stenosis.   IMPRESSION: Degenerative changes at L5-S1 which are significantly increased since the prior MRI. Disc bulge and facet arthrosis at L5-S1 resulting in moderate left and mild-to-moderate right foraminal stenosis. There is mild lateral recess narrowing at L5-S1 without significant impingement upon the traversing nerve roots.   Prominent degenerative endplate changes and discogenic edema at L5-S1 which may contribute to back pain.   Findings suggestive of fibroid uterus. 3.2 cm cyst in the pelvis, likely adnexal cyst.     Electronically Signed   By: Donnice Mania M.D.   On: 12/05/2023 12:12     Objective:  VS:  HT:    WT:   BMI:     BP:(!) 163/95  HR:75bpm  TEMP: ( )  RESP:  Physical Exam Vitals and nursing note reviewed.  Constitutional:      General: She is not in acute distress.    Appearance: Normal appearance. She is not ill-appearing.  HENT:     Head: Normocephalic  and atraumatic.     Right Ear: External ear normal.     Left Ear: External ear normal.  Eyes:     Extraocular Movements: Extraocular movements intact.  Cardiovascular:     Rate and Rhythm: Normal rate.     Pulses: Normal pulses.  Pulmonary:     Effort: Pulmonary effort is normal. No respiratory distress.  Abdominal:     General: There is no distension.     Palpations: Abdomen is soft.  Musculoskeletal:        General: Tenderness present.     Cervical back: Neck supple.     Right lower leg: No edema.     Left lower leg: No edema.      Comments: Patient has good distal strength with no pain over the greater trochanters.  No clonus or focal weakness.  Skin:    Findings: No erythema, lesion or rash.  Neurological:     General: No focal deficit present.     Mental Status: She is alert and oriented to person, place, and time.     Sensory: No sensory deficit.     Motor: No weakness or abnormal muscle tone.     Coordination: Coordination normal.  Psychiatric:        Mood and Affect: Mood normal.        Behavior: Behavior normal.      Imaging: XR C-ARM NO REPORT Result Date: 04/22/2024 Please see Notes tab for imaging impression.

## 2024-04-22 NOTE — Progress Notes (Unsigned)
 Pain Scale   Average Pain 9 Patient advising she has lower back pain non-radiating Patient advising her pain is constant        +Driver, -BT, -Dye Allergies.

## 2024-05-13 ENCOUNTER — Ambulatory Visit: Payer: PRIVATE HEALTH INSURANCE | Admitting: Nurse Practitioner

## 2024-05-13 NOTE — Progress Notes (Deleted)
 Occupational Health- [facility]  Subjective:  Patient ID: Lori Cameron, female    DOB: 1976-03-31  Age: 48 y.o. MRN: [id number]  CC: No chief complaint on file.   HPI [name] presents for wellness exam visit for insurance benefit.  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   C&B/ LEEP AUGUST 2010 WITH CIN 1 AND MARGINS FREE   Hypertension    PONV (postoperative nausea and vomiting)     Past Surgical History:  Procedure Laterality Date   ABDOMINAL SURGERY     LAPAROTOMY APPENDECTOMY, LYSIS OF ADHESIONS   APPENDECTOMY     CERVICAL BIOPSY  W/ LOOP ELECTRODE EXCISION     CINI MARGINS FREE   DILITATION & CURRETTAGE/HYSTROSCOPY WITH HYDROTHERMAL ABLATION N/A 02/23/2020   Procedure: DILATATION & CURETTAGE/HYSTEROSCOPY WITH HYDROTHERMAL ABLATION;  Surgeon: Timmie Norris, MD;  Location: [facility];  Service: Gynecology;  Laterality: N/A;   INTRAUTERINE DEVICE (IUD) INSERTION N/A 02/23/2020   Procedure: INTRAUTERINE DEVICE (IUD) INSERTION MARENA;  Surgeon: Timmie Norris, MD;  Location: [facility];  Service: Gynecology;  Laterality: N/A;   MEDIAL PARTIAL KNEE REPLACEMENT Right 11/2021   TONSILLECTOMY AND ADENOIDECTOMY      Outpatient Medications Prior to Visit  Medication Sig Dispense Refill   ALPRAZolam (XANAX) 0.25 MG tablet Take 0.25 mg by mouth daily as needed for anxiety.     ammonium lactate (AMLACTIN DAILY) 12 % lotion 1  application to affected area Externally Twice a day for 30 days     B Complex Vitamins (B COMPLEX PO) Take by mouth daily.     citalopram (CELEXA) 10 MG tablet citalopram 10 mg tablet     clobetasol ointment (TEMOVATE) 0.05 % APPLY THIN LAYER OF OINTMENT TOPICALLY TO AFFECTED AREA NIGHTLY FOR 14 DAYS AND THEN USE UP TO TWICE A WEEK AS NEEDED THEREAFTER     losartan-hydrochlorothiazide (HYZAAR) 50-12.5 MG tablet Take 1 tablet by mouth daily.     MAGNESIUM PO Take by mouth.     naproxen  (NAPROSYN ) 500 MG tablet Take 1 tablet (500 mg total) by mouth 2 (two) times daily with a meal. 30 tablet 0   predniSONE  (STERAPRED UNI-PAK 21 TAB) 10 MG (21) TBPK tablet Take by mouth daily. tad 21 tablet 0   tretinoin (RETIN-A) 0.025 % cream APPLY A PEA SIZE OF CREAM TOPICALLY TO FACE NIGHTLY     VENTOLIN  HFA 108 (90 Base) MCG/ACT inhaler SMARTSIG:1 Puff(s) By Mouth Every 6 Hours PRN     VITAMIN D, ERGOCALCIFEROL, PO Take by mouth.     VITAMIN K PO Take by mouth.     No facility-administered medications prior to visit.    ROS Review of Systems  Objective:  There were no vitals taken for this visit.  Physical Exam   Assessment & Plan:    Diagnoses and all orders for this visit:  Participant in health and wellness plan  No orders of the defined types were placed in this encounter.   No orders of the defined types were placed in this encounter.   Follow-up: No follow-ups on file.

## 2024-05-20 ENCOUNTER — Encounter: Payer: Self-pay | Admitting: Nurse Practitioner

## 2024-05-20 ENCOUNTER — Ambulatory Visit: Payer: PRIVATE HEALTH INSURANCE | Admitting: Nurse Practitioner

## 2024-05-20 VITALS — BP 126/78 | HR 92 | Temp 98.4°F | Ht 62.5 in | Wt 169.0 lb

## 2024-05-20 DIAGNOSIS — Z789 Other specified health status: Secondary | ICD-10-CM

## 2024-05-20 NOTE — Progress Notes (Signed)
 Established Patient Office Visit  Subjective : Lori Cameron presents to clinic today for her wellness exam visit for insurance benefit.  Patient has a PCP: Yes PMH significant for: HTN, prediabetes, chronic low back pain followed by Ortho, asthma.  Last labs per PCP were completed. She has upcoming appointment with her Ob-Gyn and has appointment with PCP in December.  Health Maintenance:  Colonoscopy: UTD Mammogram: UTD Pap: UTD    Smoker: No  Immunizations:  Flu- Encouraged she go to upcoming flu clinic Tdap- states UTD within last 10 years   Lifestyle: Diet- Poor per patient report, attempting to decrease carb intake Exercise- Activity limited due to pain     Patient ID: Lori Cameron Molt, female    DOB: May 19, 1976  Age: 48 y.o. MRN: [id number]  Chief Complaint  Patient presents with   Wellness visit    HPI: Followed by Dr. Ethyl for chronic back pain. She has PCP and Ob-Gyn that she is current with and has upcoming appointments.    Review of Systems  Constitutional:  Negative for malaise/fatigue and weight loss.  HENT:  Negative for hearing loss.   Eyes:  Negative for blurred vision.  Respiratory:  Negative for shortness of breath.   Cardiovascular:  Negative for chest pain and leg swelling.  Gastrointestinal:  Negative for abdominal pain and blood in stool.  Genitourinary:  Negative for dysuria.  Musculoskeletal:  Positive for back pain.  Neurological:  Negative for dizziness and headaches.      Objective:     BP 126/78 (BP Location: Left Arm)   Pulse 92   Temp 98.4 F (36.9 C) (Oral)   Ht 5' 2.5 (1.588 m)   Wt 169 lb (76.7 kg)   SpO2 95%   BMI 30.42 kg/m    Physical Exam Constitutional:      General: She is not in acute distress.    Appearance: Normal appearance.  HENT:     Head: Normocephalic and atraumatic.     Right Ear: Tympanic membrane, ear canal and external ear normal.     Left Ear: Tympanic membrane, ear canal and external ear  normal.     Nose: Nose normal.     Mouth/Throat:     Mouth: Mucous membranes are moist.     Pharynx: Oropharynx is clear.  Eyes:     Conjunctiva/sclera: Conjunctivae normal.     Pupils: Pupils are equal, round, and reactive to light.  Neck:     Vascular: No carotid bruit.  Cardiovascular:     Rate and Rhythm: Normal rate and regular rhythm.     Pulses: Normal pulses.     Heart sounds: Normal heart sounds.  Pulmonary:     Effort: Pulmonary effort is normal.     Breath sounds: Normal breath sounds.  Abdominal:     General: Bowel sounds are normal. There is no distension.     Palpations: Abdomen is soft. There is no mass.     Tenderness: There is no abdominal tenderness. There is no guarding or rebound.     Hernia: No hernia is present.  Musculoskeletal:     Cervical back: Neck supple. No rigidity or tenderness.     Right lower leg: No edema.     Left lower leg: No edema.  Lymphadenopathy:     Cervical: No cervical adenopathy.  Skin:    General: Skin is warm and dry.  Neurological:     General: No focal deficit present.     Mental Status: She  is alert and oriented to person, place, and time.  Psychiatric:        Mood and Affect: Mood normal.        Behavior: Behavior normal.      No results found for any visits on 05/20/24.    The 10-year ASCVD risk score (Arnett DK, et al., 2019) is: 6.4%    Assessment & Plan:   Adult wellness physical was conducted today. Importance of diet and exercise were discussed in detail. Goal 150 minutes moderate intensity exercise. Discussed trial of water aerobics for back pain. Increase fruits, vegetables, whole grains, lean meat in diet. We reviewed immunizations and gave recommendations regarding current immunization needed for age.  Preventative health exams needed: Currently UTD. Follow specialists recommendations for repeat colonoscopy, pap smear, and mammogram.  Patient was advised yearly wellness exam  Problem List Items  Addressed This Visit   None Visit Diagnoses       Participant in health and wellness plan    -  Primary       Return in about 1 year (around 05/20/2025) for Wellness exam, sooner with concerns.    Lori Cameron Lori Lisandra Mathisen, FNP

## 2024-06-08 ENCOUNTER — Telehealth: Payer: Self-pay | Admitting: Physical Medicine and Rehabilitation

## 2024-06-08 ENCOUNTER — Other Ambulatory Visit: Payer: Self-pay | Admitting: Physical Medicine and Rehabilitation

## 2024-06-08 ENCOUNTER — Encounter: Payer: Self-pay | Admitting: Physical Medicine and Rehabilitation

## 2024-06-08 MED ORDER — PREDNISONE 50 MG PO TABS: 50.0000 mg | ORAL_TABLET | Freq: Every day | ORAL | 0 refills | Status: DC

## 2024-06-08 NOTE — Telephone Encounter (Signed)
 Pt called wanting to make an appt back pains. She stated that she left a message in [facility] as well. Pt call back number is [phone]

## 2024-06-10 ENCOUNTER — Ambulatory Visit: Payer: Self-pay | Admitting: Nurse Practitioner

## 2024-06-10 ENCOUNTER — Encounter: Payer: Self-pay | Admitting: Nurse Practitioner

## 2024-06-10 VITALS — BP 150/90 | HR 102 | Temp 98.5°F

## 2024-06-10 DIAGNOSIS — J40 Bronchitis, not specified as acute or chronic: Secondary | ICD-10-CM

## 2024-06-10 LAB — POC COVID19/FLU A&B COMBO
Covid Antigen, POC: NEGATIVE
Influenza A Antigen, POC: NEGATIVE
Influenza B Antigen, POC: NEGATIVE

## 2024-06-10 MED ORDER — AZITHROMYCIN 250 MG PO TABS: ORAL_TABLET | ORAL | 0 refills | Status: AC

## 2024-06-10 MED ORDER — BENZONATATE 100 MG PO CAPS: 100.0000 mg | ORAL_CAPSULE | Freq: Two times a day (BID) | ORAL | 0 refills | Status: DC | PRN

## 2024-06-10 NOTE — Progress Notes (Signed)
 [facility]ational [facility]  Acute Office Visit  Subjective:     Patient ID: Lori Cameron, female    DOB: [birth year], 48 y.o.   MRN: [id number]  Chief Complaint  Patient presents with   Cough    HPI Patient is in today for for cough, post nasal drainage and congestion. Started Sunday. Denies fevers or chills.  Hx of asthma- uses albuterol  as needed. Has been using albuterol  3 x a day since Sunday  Cough with clear sputum production.  Was started on prednisone  50 mg for 6 days, for back pain recently, currently on day 2.   Other than albuterol  inhaler and prednisone , she has been using  Echinacea supplements 2 a day.   Review of Systems  Constitutional:  Positive for malaise/fatigue. Negative for chills and fever.  HENT:  Positive for congestion. Negative for sinus pain and sore throat.   Respiratory:  Positive for cough, sputum production and shortness of breath. Negative for wheezing.   Cardiovascular:  Negative for chest pain.  Musculoskeletal:  Negative for myalgias.  Neurological:  Negative for dizziness and headaches.        Objective:    BP (!) 150/90 (BP Location: Right Arm, Patient Position: Sitting, Cuff Size: Normal)   Pulse (!) 102   Temp 98.5 F (36.9 C) (Oral)   SpO2 96%    Physical Exam Constitutional:      General: She is not in acute distress. HENT:     Head: Normocephalic.     Right Ear: Tympanic membrane, ear canal and external ear normal.     Left Ear: Tympanic membrane, ear canal and external ear normal.     Nose: No congestion.     Right Turbinates: Not enlarged or swollen.     Left Turbinates: Not enlarged or swollen.     Right Sinus: No maxillary sinus tenderness or frontal sinus tenderness.     Left Sinus: No maxillary sinus tenderness or frontal sinus tenderness.     Mouth/Throat:     Pharynx: Oropharynx is clear. No oropharyngeal exudate or posterior oropharyngeal erythema.  Eyes:     Pupils: Pupils are equal, round, and  reactive to light.  Cardiovascular:     Rate and Rhythm: Regular rhythm. Tachycardia present.     Heart sounds: Normal heart sounds.  Pulmonary:     Effort: Pulmonary effort is normal. No respiratory distress.     Breath sounds: Normal breath sounds. No wheezing.  Musculoskeletal:        General: Normal range of motion.     Cervical back: Normal range of motion.  Lymphadenopathy:     Cervical: No cervical adenopathy.  Neurological:     General: No focal deficit present.     Mental Status: She is alert and oriented to person, place, and time.     Results for orders placed or performed in visit on 06/10/24  POC Covid19/Flu A&B Antigen  Result Value Ref Range   Influenza A Antigen, POC Negative Negative   Influenza B Antigen, POC Negative Negative   Covid Antigen, POC Negative Negative        Assessment & Plan:   Problem List Items Addressed This Visit   None Visit Diagnoses       Bronchitis    -  Primary   Relevant Medications   benzonatate (TESSALON) 100 MG capsule   azithromycin (ZITHROMAX) 250 MG tablet   Other Relevant Orders   POC Covid19/Flu A&B Antigen (Completed)  POC COVID and  Flu negative today. Encouraged supportive care measures including tessalon perles, flonase , and albuterol  inhaler as needed. Low suspicion for asthma exacerbation; however, complete prednisone  as already prescribed (for back pain). If symptoms continue to persist after 2-3 weeks, can trial z-pack which has been sent to her pharmacy . Patient is agreeable to plan of care.      Meds ordered this encounter  Medications   benzonatate (TESSALON) 100 MG capsule    Sig: Take 1 capsule (100 mg total) by mouth 2 (two) times daily as needed for cough.    Dispense:  20 capsule    Refill:  0    Supervising Provider:   BACIGALUPO, ANGELA M [[id number]]   azithromycin (ZITHROMAX) 250 MG tablet    Sig: Take 2 tablets on day 1, then 1 tablet daily on days 2 through 5    Dispense:  6 tablet    Refill:   0    Supervising Provider:   BACIGALUPO, ANGELA M [[id number]]    As needed.   Chen Holzman Flores Leanor Voris, NP

## 2024-06-15 ENCOUNTER — Other Ambulatory Visit: Payer: Self-pay | Admitting: Nurse Practitioner

## 2024-06-15 MED ORDER — FLUCONAZOLE 150 MG PO TABS: 150.0000 mg | ORAL_TABLET | Freq: Once | ORAL | 0 refills | Status: AC

## 2024-06-15 NOTE — Progress Notes (Signed)
 Recent abx use.

## 2024-06-22 ENCOUNTER — Ambulatory Visit: Payer: Self-pay | Admitting: Nurse Practitioner

## 2024-06-22 VITALS — BP 120/78 | HR 83 | Temp 97.4°F

## 2024-06-22 DIAGNOSIS — J0110 Acute frontal sinusitis, unspecified: Secondary | ICD-10-CM

## 2024-06-22 MED ORDER — AMOXICILLIN-POT CLAVULANATE 875-125 MG PO TABS: 1.0000 | ORAL_TABLET | Freq: Two times a day (BID) | ORAL | 0 refills | Status: AC

## 2024-06-22 MED ORDER — FLUCONAZOLE 150 MG PO TABS: 150.0000 mg | ORAL_TABLET | Freq: Once | ORAL | 0 refills | Status: AC

## 2024-06-22 NOTE — Progress Notes (Signed)
 Acute Office Visit  Subjective:     Patient ID: Lori Cameron, female    DOB: [birth year], 48 y.o.   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 Onset of symptoms was about 06/05/2024. She was seen by NP in clinic 06/10/2024. At this visit she was Rx'ed tessalon perles and Zpak. She has completed these. She has noticed foul odor of her nasal drainage. She has been blowing out green drainage and has new pressure/pain between her eyes over the last 2-3 days. She has tried using a Neti pot. She completed the Zpak about 1 week ago. Reports no improvement in symptoms. Cough that is non productive. Denies fever, chills, body aches, chest pain, shortness of breath. Since onset of symptoms, she is feeling slightly worse overall. She tried ibuprofen  yesterday for her sinus pressure to some effect.   She has history of uterine ablation and Mirena . No known LMP.   Review of Systems  Constitutional:  Positive for malaise/fatigue. Negative for chills and fever.  HENT:  Positive for congestion and sinus pain. Negative for ear pain and sore throat.   Respiratory:  Positive for cough. Negative for sputum production, shortness of breath and wheezing.   Cardiovascular:  Negative for chest pain.        Objective:    BP 120/78 (BP Location: Right Arm, Patient Position: Sitting, Cuff Size: Normal)   Pulse 83   Temp (!) 97.4 F (36.3 C) (Temporal)   SpO2 97%    Physical Exam Constitutional:      General: She is not in acute distress.    Appearance: Normal appearance.  HENT:     Head: Normocephalic and atraumatic.     Comments: TTP over frontal sinuses    Right Ear: Tympanic membrane, ear canal and external ear normal.     Left Ear: Tympanic membrane, ear canal and external ear normal.     Nose: Nose normal.     Mouth/Throat:     Mouth: Mucous membranes are moist.     Pharynx: Oropharynx is clear. No  oropharyngeal exudate.     Comments: Mildly erythematous posterior pharynx Eyes:     Conjunctiva/sclera: Conjunctivae normal.     Pupils: Pupils are equal, round, and reactive to light.  Cardiovascular:     Rate and Rhythm: Normal rate and regular rhythm.     Pulses: Normal pulses.     Heart sounds: Normal heart sounds.  Pulmonary:     Effort: Pulmonary effort is normal.     Breath sounds: Normal breath sounds.  Musculoskeletal:     Cervical back: Neck supple. No tenderness.  Lymphadenopathy:     Cervical: No cervical adenopathy.  Skin:    General: Skin is warm and dry.  Neurological:     General: No focal deficit present.     Mental Status: She is alert and oriented to person, place, and time.  Psychiatric:        Mood and Affect: Mood normal.        Thought Content: Thought content normal.        Judgment: Judgment normal.     No results found for any visits on 06/22/24.      Assessment & Plan:   Symptoms worsening overall. Discussed that this is likely ABRS at this point. Treat with antibiotics. Discussed options for treatment and she would like to try Augmentin despite stomach  upset on medication. Take with food and eat yogurt regularly during treatment. Ibuprofen  for pain as needed. RTC for worsening symptoms, no improvement in 2-3 days of treatment, or any other concern.   Fluconazole  provided for history of yeast infections with antibiotics. Provided written materials about its use.   Problem List Items Addressed This Visit   None Visit Diagnoses       Acute non-recurrent frontal sinusitis    -  Primary   Relevant Medications   amoxicillin-clavulanate (AUGMENTIN) 875-125 MG tablet   fluconazole  (DIFLUCAN ) 150 MG tablet       Meds ordered this encounter  Medications   amoxicillin-clavulanate (AUGMENTIN) 875-125 MG tablet    Sig: Take 1 tablet by mouth 2 (two) times daily for 7 days.    Dispense:  14 tablet    Refill:  0    Supervising Provider:    BACIGALUPO, ANGELA M [[id number]]   fluconazole  (DIFLUCAN ) 150 MG tablet    Sig: Take 1 tablet (150 mg total) by mouth once for 1 dose.    Dispense:  2 tablet    Refill:  0    Supervising Provider:   BACIGALUPO, ANGELA M [[id number]]    Return if symptoms worsen or fail to improve.  Reiley Bertagnolli E Hadassah Rana, FNP

## 2024-06-28 ENCOUNTER — Encounter: Payer: Self-pay | Admitting: Radiology

## 2024-08-11 ENCOUNTER — Telehealth: Payer: Self-pay | Admitting: Physical Medicine and Rehabilitation

## 2024-08-11 NOTE — Telephone Encounter (Signed)
 Pt called and wants to be scheduled for back pain. CB#[phone]

## 2024-08-13 ENCOUNTER — Other Ambulatory Visit: Payer: Self-pay | Admitting: Nurse Practitioner

## 2024-08-13 DIAGNOSIS — N852 Hypertrophy of uterus: Secondary | ICD-10-CM

## 2024-08-13 DIAGNOSIS — Z86018 Personal history of other benign neoplasm: Secondary | ICD-10-CM

## 2024-08-16 ENCOUNTER — Encounter: Payer: Self-pay | Admitting: Physical Medicine and Rehabilitation

## 2024-08-16 ENCOUNTER — Ambulatory Visit (INDEPENDENT_AMBULATORY_CARE_PROVIDER_SITE_OTHER): Payer: Self-pay | Admitting: Physical Medicine and Rehabilitation

## 2024-08-16 DIAGNOSIS — M5136 Other intervertebral disc degeneration, lumbar region with discogenic back pain only: Secondary | ICD-10-CM

## 2024-08-16 DIAGNOSIS — M545 Low back pain, unspecified: Secondary | ICD-10-CM

## 2024-08-16 DIAGNOSIS — M47816 Spondylosis without myelopathy or radiculopathy, lumbar region: Secondary | ICD-10-CM | POA: Diagnosis not present

## 2024-08-16 DIAGNOSIS — G8929 Other chronic pain: Secondary | ICD-10-CM | POA: Diagnosis not present

## 2024-08-16 NOTE — Progress Notes (Signed)
 Pain Scale   Average Pain 6 Patient advising she has chronic lower back pain that is constant.        +Driver, -BT, -Dye Allergies.

## 2024-08-16 NOTE — Progress Notes (Signed)
 "  Lori Cameron - 48 y.o. female MRN [id number]  Date of birth: 1975-11-05  Office Visit Note: Visit Date: 08/16/2024 PCP: Rexanne Ingle, MD Referred by: Rexanne Ingle, MD  Subjective: Chief Complaint  Patient presents with   Lower Back - Pain   HPI: Lori Cameron is a 48 y.o. female who comes in today for evaluation of chronic, worsening and severe bilateral lower back pain radiating to buttocks and lateral hips. Pain ongoing for several years. She is patient of Dr. Ozell Ada. She has long history of more discogenic lower back pain. Her pain becomes severe when laying down to sleep, also reports pain when moving from sitting to standing position. She describes pain as stiff and sore sensation, currently rates as 6 out of 10. She has tried formal physical therapy with minimal relief of pain. States PT treatments caused her to be very sore. She is currently undergoing chiropractic treatments and has a plan to start aquatic therapy in the coming weeks. Lumbar MRI imaging from April of 2025 shows degenerative changes at L5-S1. There is disc bulge and facet arthrosis at L5-S1 resulting in moderate left and mild-to-moderate right foraminal stenosis. Prominent degenerative endplate changes and discogenic edema at L5-S1 which may contribute to back pain.   She underwent bilateral L5-S1 radiofrequency ablation in our office on 03/27/2024. She reports greater than 50% relief of pain that continues to sustain. History of lumbar epidural steroid injection and bilateral sacroiliac joint injections with minimal relief of pain.   She is here today to discuss treatment options. Patient denies focal weakness, numbness and tingling. No recent trauma or falls.      Review of Systems  Musculoskeletal:  Positive for back pain.  Neurological:  Negative for tingling, sensory change, focal weakness and weakness.  All other systems reviewed and are negative.  Otherwise per HPI.  Assessment &  Plan: Visit Diagnoses:    ICD-10-CM   1. Chronic bilateral low back pain without sciatica  M54.50    G89.29     2. Spondylosis without myelopathy or radiculopathy, lumbar region  M47.816     3. Facet arthropathy, lumbar  M47.816     4. 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 Patient continues to have severe pain despite good conservative therapies such as formal physical therapy, home exercise regimen, rest and use of medications. Previous lumbar MRI imaging shows degenerative changes at L5-S1. I do think her lower back pain is more discogenic in nature. She does experience a good bit of stiffness and limited mobility to lower back. Significant relief of pain with previous bilateral L5-S1 radiofrequency ablation in August of 2025. Pain relief post ablation continues to sustain. We discussed treatment options in detail today. She can re-group with Dr. Ada to discuss further surgical interventions. Other options include spinal cord stimulation and re-grouping with physical therapy. She would prefer to continue with chiropractic treatments and massage therapy at this time. She is not interested in surgical intervention, including spinal cord stimulator placement. We are happy to see her back, can repeat ablation procedure as needed. Her exam today is non focal, good strength noted to bilateral lower extremities.     Meds & Orders: No orders of the defined types were placed in this encounter.  No orders of the defined types were placed in this encounter.   Follow-up: Return if  symptoms worsen or fail to improve.   Procedures: No procedures performed      Clinical History: MRI LUMBAR SPINE WITHOUT CONTRAST   TECHNIQUE: Multiplanar, multisequence MR imaging of the lumbar spine was performed. No intravenous contrast was administered.   COMPARISON:  MRI lumbar spine  02/06/2015.   FINDINGS: Segmentation:  Standard.   Alignment: Lumbar lordosis is maintained. No significant listhesis.   Vertebrae: Prominent Modic type 1 degenerative endplate changes at L5-S1 with associated discogenic edema which is new since the prior study. Vertebral body heights are maintained. No additional bone marrow edema or evidence of acute fracture.   Conus medullaris and cauda equina: Conus extends to the T12-L1 level. Conus and cauda equina appear normal.   Paraspinal and other soft tissues: Visualized paraspinal soft tissues are unremarkable. Prominence of the partially visualized uterus with suggestion of possible fibroids. Additional 3.2 cm cyst in the pelvis right of midline suggestive of adnexal cyst.   Disc levels:   T12-L1: No significant disc bulge. No significant spinal canal stenosis or foraminal stenosis.   L1-2: No significant disc bulge. No significant spinal canal stenosis or foraminal stenosis.   L2-3: No significant disc bulge. No significant spinal canal stenosis or foraminal stenosis.   L3-4: No significant disc bulge. No significant spinal canal stenosis or foraminal stenosis.   L4-5: No significant disc bulge. No significant spinal canal stenosis or foraminal stenosis.   L5-S1: Disc desiccation and moderate disc height loss. Small disc bulge which indents the ventral thecal sac resulting in narrowing of the lateral recesses without significant impingement upon the traversing nerve roots. Mild facet arthrosis. There is mild-to-moderate right and moderate left foraminal stenosis.   IMPRESSION: Degenerative changes at L5-S1 which are significantly increased since the prior MRI. Disc bulge and facet arthrosis at L5-S1 resulting in moderate left and mild-to-moderate right foraminal stenosis. There is mild lateral recess narrowing at L5-S1 without significant impingement upon the traversing nerve roots.   Prominent degenerative endplate  changes and discogenic edema at L5-S1 which may contribute to back pain.   Findings suggestive of fibroid uterus. 3.2 cm cyst in the pelvis, likely adnexal cyst.     Electronically Signed   By: Lori Cameron M.D.   On: 12/05/2023 12:12   She reports that she has quit smoking. She has been exposed to tobacco smoke. She has never used smokeless tobacco. No results for input(s): HGBA1C, LABURIC in the last 8760 hours.  Objective:  VS:  HT:    WT:   BMI:     BP:   HR: bpm  TEMP: ( )  RESP:  Physical Exam Vitals and nursing note reviewed.  HENT:     Head: Normocephalic and atraumatic.     Right Ear: External ear normal.     Left Ear: External ear normal.     Nose: Nose normal.     Mouth/Throat:     Mouth: Mucous membranes are moist.  Eyes:     Extraocular Movements: Extraocular movements intact.  Cardiovascular:     Rate and Rhythm: Normal rate.     Pulses: Normal pulses.  Pulmonary:     Effort: Pulmonary effort is normal.  Abdominal:     General: Abdomen is flat. There is no distension.  Musculoskeletal:        General: Tenderness present.     Cervical back: Normal range of motion.     Comments: Patient rises from seated position to standing without difficulty. Pain noted with facet loading and  lumbar extension. 5/5 strength noted with bilateral hip flexion, knee flexion/extension, ankle dorsiflexion/plantarflexion and EHL. No clonus noted bilaterally. No pain upon palpation of greater trochanters. No pain with internal/external rotation of bilateral hips. Sensation intact bilaterally. Negative slump test bilaterally. Ambulates without aid, gait steady.   Skin:    General: Skin is warm and dry.     Capillary Refill: Capillary refill takes less than 2 seconds.  Neurological:     General: No focal deficit present.     Mental Status: She is alert and oriented to person, place, and time.  Psychiatric:        Mood and Affect: Mood normal.        Behavior: Behavior normal.      Ortho Exam  Imaging: No results found.  Past Medical/Family/Surgical/Social History: Medications & Allergies reviewed per EMR, new medications updated. 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   C&B/ LEEP AUGUST 2010 WITH CIN 1 AND MARGINS FREE   Hypertension    PONV (postoperative nausea and vomiting)    Family History  Problem Relation Age of Onset   Diabetes Mother    Hypertension Mother    Cervical cancer Mother    Arthritis Mother    Healthy Sister    Healthy Sister    Healthy Brother    Healthy Brother    Colon cancer Maternal Aunt    Hypertension Maternal Grandmother    Heart disease Maternal Grandmother    BRCA 1/2 Neg Hx    Breast cancer Neg Hx    Past Surgical History:  Procedure Laterality Date   ABDOMINAL SURGERY     LAPAROTOMY APPENDECTOMY, LYSIS OF ADHESIONS   APPENDECTOMY     CERVICAL BIOPSY  W/ LOOP ELECTRODE EXCISION     CINI MARGINS FREE   DILITATION & CURRETTAGE/HYSTROSCOPY WITH HYDROTHERMAL ABLATION N/A 02/23/2020   Procedure: DILATATION & CURETTAGE/HYSTEROSCOPY WITH HYDROTHERMAL ABLATION;  Surgeon: Timmie Norris, MD;  Location: [facility];  Service: Gynecology;  Laterality: N/A;   INTRAUTERINE DEVICE (IUD) INSERTION N/A 02/23/2020   Procedure: INTRAUTERINE DEVICE (IUD) INSERTION MARENA;  Surgeon: Timmie Norris, MD;  Location: [facility];  Service:  Gynecology;  Laterality: N/A;   MEDIAL PARTIAL KNEE REPLACEMENT Right 11/2021   TONSILLECTOMY AND ADENOIDECTOMY     Social History   Occupational History   Not on file  Tobacco Use   Smoking status: Former    Passive exposure: Current   Smokeless tobacco: Never  Vaping Use   Vaping status: Never Used  Substance and Sexual Activity   Alcohol use: Yes    Alcohol/week: 7.0 standard drinks of alcohol    Types: 7 Glasses of wine per week    Comment: occ   Drug use: No   Sexual activity: Yes    Partners: Male    Birth control/protection: I.U.D.    Comment: 1st intercourse  19 yo-5 partners    "

## 2024-08-30 ENCOUNTER — Other Ambulatory Visit: Payer: Self-pay

## 2024-09-03 ENCOUNTER — Ambulatory Visit
Admission: RE | Admit: 2024-09-03 | Discharge: 2024-09-03 | Disposition: A | Payer: PRIVATE HEALTH INSURANCE | Source: Ambulatory Visit | Attending: Nurse Practitioner | Admitting: Nurse Practitioner

## 2024-09-03 DIAGNOSIS — Z86018 Personal history of other benign neoplasm: Secondary | ICD-10-CM

## 2024-09-03 DIAGNOSIS — N852 Hypertrophy of uterus: Secondary | ICD-10-CM

## 2024-09-23 ENCOUNTER — Ambulatory Visit: Payer: PRIVATE HEALTH INSURANCE | Admitting: Nurse Practitioner

## 2024-09-23 NOTE — Progress Notes (Unsigned)
 Occupational Health- [facility]  Subjective:  Patient ID: Lori Cameron, female    DOB: [birth year]  Age: 49 y.o. MRN: [id number]  CC: No chief complaint on file.   HPI Lori Cameron presents for wellness exam visit for insurance benefit.  Patient has a PCP: Dr. 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   C&B/ LEEP AUGUST 2010 WITH CIN 1 AND MARGINS FREE   Hypertension    PONV (postoperative nausea and vomiting)     Past Surgical History:  Procedure Laterality Date   ABDOMINAL SURGERY     LAPAROTOMY APPENDECTOMY, LYSIS OF ADHESIONS   APPENDECTOMY     CERVICAL BIOPSY  W/ LOOP ELECTRODE EXCISION     CINI MARGINS FREE   DILITATION & CURRETTAGE/HYSTROSCOPY WITH HYDROTHERMAL ABLATION N/A 02/23/2020   Procedure: DILATATION & CURETTAGE/HYSTEROSCOPY WITH HYDROTHERMAL ABLATION;  Surgeon: Timmie Norris, MD;  Location: [facility];  Service: Gynecology;  Laterality: N/A;   INTRAUTERINE DEVICE (IUD) INSERTION N/A 02/23/2020   Procedure: INTRAUTERINE DEVICE (IUD) INSERTION MARENA;  Surgeon: Timmie Norris, MD;  Location: [facility];  Service: Gynecology;  Laterality: N/A;   MEDIAL PARTIAL KNEE REPLACEMENT Right 11/2021   TONSILLECTOMY AND ADENOIDECTOMY      Outpatient Medications Prior to Visit  Medication Sig Dispense Refill   ALPRAZolam (XANAX) 0.25 MG tablet Take 0.25 mg by mouth daily  as needed for anxiety.     ammonium lactate (AMLACTIN DAILY) 12 % lotion 1 application to affected area Externally Twice a day for 30 days     B Complex Vitamins (B COMPLEX PO) Take by mouth daily.     citalopram (CELEXA) 10 MG tablet Take 30 mg by mouth daily.     clobetasol ointment (TEMOVATE) 0.05 % APPLY THIN LAYER OF OINTMENT TOPICALLY TO AFFECTED AREA NIGHTLY FOR 14 DAYS AND THEN USE UP TO TWICE A WEEK AS NEEDED THEREAFTER     losartan-hydrochlorothiazide (HYZAAR) 50-12.5 MG tablet Take 1 tablet by mouth daily.     MAGNESIUM PO Take by mouth.     tretinoin (RETIN-A) 0.025 % cream APPLY A PEA SIZE OF CREAM TOPICALLY TO FACE NIGHTLY     VENTOLIN  HFA 108 (90 Base) MCG/ACT inhaler SMARTSIG:1 Puff(s) By Mouth Every 6 Hours PRN     VITAMIN D, ERGOCALCIFEROL, PO Take by mouth.     VITAMIN K PO Take by mouth.     No facility-administered medications prior to visit.    ROS Review of Systems  Objective:  There were no vitals taken for this visit.  Physical Exam   Assessment & Plan:    There are no diagnoses linked to this encounter.  No orders of the defined types were placed in this encounter.   No orders of the defined types were placed in this encounter.   Follow-up: No follow-ups on file.
